# Patient Record
Sex: Female | Born: 1945
Health system: Southern US, Community
[De-identification: ages and names within clinical notes are randomized; demographics above are authoritative.]

## PROBLEM LIST (undated history)

## (undated) DIAGNOSIS — I340 Nonrheumatic mitral (valve) insufficiency: Secondary | ICD-10-CM

## (undated) DIAGNOSIS — C801 Malignant (primary) neoplasm, unspecified: Secondary | ICD-10-CM

## (undated) DIAGNOSIS — R5383 Other fatigue: Secondary | ICD-10-CM

## (undated) DIAGNOSIS — Z0000 Encounter for general adult medical examination without abnormal findings: Secondary | ICD-10-CM

## (undated) DIAGNOSIS — I341 Nonrheumatic mitral (valve) prolapse: Secondary | ICD-10-CM

## (undated) DIAGNOSIS — R519 Headache, unspecified: Secondary | ICD-10-CM

## (undated) DIAGNOSIS — G8929 Other chronic pain: Secondary | ICD-10-CM

## (undated) DIAGNOSIS — Z803 Family history of malignant neoplasm of breast: Secondary | ICD-10-CM

## (undated) DIAGNOSIS — E875 Hyperkalemia: Secondary | ICD-10-CM

## (undated) DIAGNOSIS — Z9181 History of falling: Secondary | ICD-10-CM

## (undated) DIAGNOSIS — R011 Cardiac murmur, unspecified: Secondary | ICD-10-CM

## (undated) DIAGNOSIS — R51 Headache: Secondary | ICD-10-CM

## (undated) DIAGNOSIS — IMO0001 Reserved for inherently not codable concepts without codable children: Secondary | ICD-10-CM

## (undated) DIAGNOSIS — R569 Unspecified convulsions: Secondary | ICD-10-CM

## (undated) DIAGNOSIS — Z9289 Personal history of other medical treatment: Secondary | ICD-10-CM

## (undated) DIAGNOSIS — K5900 Constipation, unspecified: Secondary | ICD-10-CM

## (undated) DIAGNOSIS — Z9889 Other specified postprocedural states: Secondary | ICD-10-CM

## (undated) DIAGNOSIS — R7982 Elevated C-reactive protein (CRP): Secondary | ICD-10-CM

## (undated) HISTORY — DX: Headache: R51

## (undated) HISTORY — DX: Elevated C-reactive protein (CRP): R79.82

## (undated) HISTORY — DX: Malignant (primary) neoplasm, unspecified: C80.1

## (undated) HISTORY — DX: Family history of malignant neoplasm of breast: Z80.3

## (undated) HISTORY — DX: History of falling: Z91.81

## (undated) HISTORY — DX: Hyperkalemia: E87.5

## (undated) HISTORY — DX: Other fatigue: R53.83

## (undated) HISTORY — DX: Encounter for general adult medical examination without abnormal findings: Z00.00

## (undated) HISTORY — DX: Headache, unspecified: R51.9

## (undated) HISTORY — DX: Nonrheumatic mitral (valve) prolapse: I34.1

## (undated) HISTORY — DX: Other chronic pain: G89.29

## (undated) HISTORY — DX: Nonrheumatic mitral (valve) insufficiency: I34.0

## (undated) HISTORY — DX: Cardiac murmur, unspecified: R01.1

---

## 1981-06-18 DIAGNOSIS — C801 Malignant (primary) neoplasm, unspecified: Secondary | ICD-10-CM

## 1981-06-18 HISTORY — PX: TOTAL ABDOMINAL HYSTERECTOMY: SHX209

## 1981-06-18 HISTORY — DX: Malignant (primary) neoplasm, unspecified: C80.1

## 1988-06-18 HISTORY — PX: LAPAROSCOPIC CHOLECYSTECTOMY: SUR755

## 1996-06-18 HISTORY — PX: BREAST BIOPSY: SHX20

## 1999-08-31 ENCOUNTER — Other Ambulatory Visit: Admission: RE | Admit: 1999-08-31 | Discharge: 1999-08-31 | Payer: Self-pay | Admitting: Family Medicine

## 1999-09-21 ENCOUNTER — Encounter: Payer: Self-pay | Admitting: Family Medicine

## 1999-09-21 ENCOUNTER — Encounter: Admission: RE | Admit: 1999-09-21 | Discharge: 1999-09-21 | Payer: Self-pay | Admitting: Family Medicine

## 1999-10-23 ENCOUNTER — Encounter: Admission: RE | Admit: 1999-10-23 | Discharge: 1999-10-23 | Payer: Self-pay | Admitting: Family Medicine

## 1999-10-23 ENCOUNTER — Encounter: Payer: Self-pay | Admitting: Family Medicine

## 2000-09-27 ENCOUNTER — Encounter: Payer: Self-pay | Admitting: Family Medicine

## 2000-09-27 ENCOUNTER — Encounter: Admission: RE | Admit: 2000-09-27 | Discharge: 2000-09-27 | Payer: Self-pay | Admitting: Family Medicine

## 2001-08-16 HISTORY — PX: COLONOSCOPY: SHX174

## 2001-10-16 ENCOUNTER — Encounter: Payer: Self-pay | Admitting: Family Medicine

## 2001-10-16 ENCOUNTER — Encounter: Admission: RE | Admit: 2001-10-16 | Discharge: 2001-10-16 | Payer: Self-pay | Admitting: Family Medicine

## 2001-11-24 ENCOUNTER — Other Ambulatory Visit: Admission: RE | Admit: 2001-11-24 | Discharge: 2001-11-24 | Payer: Self-pay | Admitting: Family Medicine

## 2002-08-29 ENCOUNTER — Encounter: Admission: RE | Admit: 2002-08-29 | Discharge: 2002-08-29 | Payer: Self-pay | Admitting: Family Medicine

## 2002-08-29 ENCOUNTER — Encounter: Payer: Self-pay | Admitting: Family Medicine

## 2002-12-07 ENCOUNTER — Encounter: Admission: RE | Admit: 2002-12-07 | Discharge: 2002-12-07 | Payer: Self-pay | Admitting: Family Medicine

## 2002-12-07 ENCOUNTER — Encounter: Payer: Self-pay | Admitting: Family Medicine

## 2003-12-10 ENCOUNTER — Encounter: Admission: RE | Admit: 2003-12-10 | Discharge: 2003-12-10 | Payer: Self-pay | Admitting: Family Medicine

## 2004-06-01 ENCOUNTER — Other Ambulatory Visit: Admission: RE | Admit: 2004-06-01 | Discharge: 2004-06-01 | Payer: Self-pay | Admitting: Family Medicine

## 2005-03-06 ENCOUNTER — Encounter: Admission: RE | Admit: 2005-03-06 | Discharge: 2005-03-06 | Payer: Self-pay | Admitting: Family Medicine

## 2005-03-19 ENCOUNTER — Encounter: Admission: RE | Admit: 2005-03-19 | Discharge: 2005-03-19 | Payer: Self-pay | Admitting: Family Medicine

## 2005-06-05 ENCOUNTER — Other Ambulatory Visit: Admission: RE | Admit: 2005-06-05 | Discharge: 2005-06-05 | Payer: Self-pay | Admitting: Family Medicine

## 2006-04-15 ENCOUNTER — Encounter: Admission: RE | Admit: 2006-04-15 | Discharge: 2006-04-15 | Payer: Self-pay | Admitting: Family Medicine

## 2006-06-06 ENCOUNTER — Other Ambulatory Visit: Admission: RE | Admit: 2006-06-06 | Discharge: 2006-06-06 | Payer: Self-pay | Admitting: Family Medicine

## 2007-04-23 ENCOUNTER — Encounter: Admission: RE | Admit: 2007-04-23 | Discharge: 2007-04-23 | Payer: Self-pay | Admitting: Family Medicine

## 2007-06-23 ENCOUNTER — Other Ambulatory Visit: Admission: RE | Admit: 2007-06-23 | Discharge: 2007-06-23 | Payer: Self-pay | Admitting: Family Medicine

## 2007-07-01 ENCOUNTER — Encounter: Admission: RE | Admit: 2007-07-01 | Discharge: 2007-07-01 | Payer: Self-pay | Admitting: Family Medicine

## 2008-05-03 ENCOUNTER — Encounter: Admission: RE | Admit: 2008-05-03 | Discharge: 2008-05-03 | Payer: Self-pay | Admitting: Family Medicine

## 2008-07-29 ENCOUNTER — Other Ambulatory Visit: Admission: RE | Admit: 2008-07-29 | Discharge: 2008-07-29 | Payer: Self-pay | Admitting: Family Medicine

## 2009-05-05 ENCOUNTER — Encounter: Admission: RE | Admit: 2009-05-05 | Discharge: 2009-05-05 | Payer: Self-pay | Admitting: Family Medicine

## 2009-08-03 ENCOUNTER — Other Ambulatory Visit: Admission: RE | Admit: 2009-08-03 | Discharge: 2009-08-03 | Payer: Self-pay | Admitting: Family Medicine

## 2009-08-08 ENCOUNTER — Encounter: Admission: RE | Admit: 2009-08-08 | Discharge: 2009-08-08 | Payer: Self-pay | Admitting: Family Medicine

## 2010-05-08 ENCOUNTER — Encounter: Admission: RE | Admit: 2010-05-08 | Discharge: 2010-05-08 | Payer: Self-pay | Admitting: Family Medicine

## 2011-03-26 ENCOUNTER — Other Ambulatory Visit: Payer: Self-pay | Admitting: Family Medicine

## 2011-03-26 DIAGNOSIS — Z1231 Encounter for screening mammogram for malignant neoplasm of breast: Secondary | ICD-10-CM

## 2011-05-14 ENCOUNTER — Ambulatory Visit
Admission: RE | Admit: 2011-05-14 | Discharge: 2011-05-14 | Disposition: A | Discharge status: Home / Self Care | Payer: BC Managed Care – PPO | Source: Ambulatory Visit | Attending: Family Medicine | Admitting: Family Medicine

## 2011-05-14 DIAGNOSIS — Z1231 Encounter for screening mammogram for malignant neoplasm of breast: Secondary | ICD-10-CM

## 2011-09-17 ENCOUNTER — Ambulatory Visit: Payer: Medicare Other | Attending: Family Medicine

## 2011-09-17 DIAGNOSIS — R5381 Other malaise: Secondary | ICD-10-CM | POA: Insufficient documentation

## 2011-09-17 DIAGNOSIS — M545 Low back pain, unspecified: Secondary | ICD-10-CM | POA: Insufficient documentation

## 2011-09-17 DIAGNOSIS — IMO0001 Reserved for inherently not codable concepts without codable children: Secondary | ICD-10-CM | POA: Insufficient documentation

## 2011-09-20 ENCOUNTER — Ambulatory Visit: Payer: Medicare Other

## 2011-10-01 ENCOUNTER — Ambulatory Visit: Payer: Medicare Other

## 2011-10-04 ENCOUNTER — Ambulatory Visit: Payer: Medicare Other | Admitting: Physical Therapy

## 2011-10-08 ENCOUNTER — Ambulatory Visit: Payer: Medicare Other | Admitting: Physical Therapy

## 2011-10-11 ENCOUNTER — Ambulatory Visit: Payer: Medicare Other | Admitting: Physical Therapy

## 2011-10-15 ENCOUNTER — Ambulatory Visit: Payer: Medicare Other | Admitting: Physical Therapy

## 2011-10-18 ENCOUNTER — Ambulatory Visit: Payer: Medicare Other | Attending: Family Medicine | Admitting: Physical Therapy

## 2011-10-18 DIAGNOSIS — M545 Low back pain, unspecified: Secondary | ICD-10-CM | POA: Insufficient documentation

## 2011-10-18 DIAGNOSIS — R5381 Other malaise: Secondary | ICD-10-CM | POA: Insufficient documentation

## 2011-10-18 DIAGNOSIS — IMO0001 Reserved for inherently not codable concepts without codable children: Secondary | ICD-10-CM | POA: Insufficient documentation

## 2011-10-23 ENCOUNTER — Encounter: Payer: BC Managed Care – PPO | Admitting: Physical Therapy

## 2011-10-25 ENCOUNTER — Ambulatory Visit: Payer: Medicare Other | Admitting: Physical Therapy

## 2011-10-31 ENCOUNTER — Ambulatory Visit: Payer: Medicare Other | Admitting: Physical Therapy

## 2011-11-02 ENCOUNTER — Ambulatory Visit: Payer: Medicare Other | Admitting: Physical Therapy

## 2011-11-05 ENCOUNTER — Ambulatory Visit: Payer: Medicare Other | Admitting: Physical Therapy

## 2011-11-08 ENCOUNTER — Ambulatory Visit: Payer: Medicare Other | Admitting: Physical Therapy

## 2012-04-04 ENCOUNTER — Other Ambulatory Visit: Payer: Self-pay | Admitting: Family Medicine

## 2012-04-04 DIAGNOSIS — Z1231 Encounter for screening mammogram for malignant neoplasm of breast: Secondary | ICD-10-CM

## 2012-05-14 ENCOUNTER — Ambulatory Visit
Admission: RE | Admit: 2012-05-14 | Discharge: 2012-05-14 | Disposition: A | Discharge status: Home / Self Care | Payer: Medicare Other | Source: Ambulatory Visit | Attending: Family Medicine | Admitting: Family Medicine

## 2012-05-14 DIAGNOSIS — Z1231 Encounter for screening mammogram for malignant neoplasm of breast: Secondary | ICD-10-CM

## 2013-03-09 ENCOUNTER — Other Ambulatory Visit: Payer: Self-pay

## 2013-03-09 DIAGNOSIS — Z1231 Encounter for screening mammogram for malignant neoplasm of breast: Secondary | ICD-10-CM

## 2013-04-09 ENCOUNTER — Other Ambulatory Visit: Payer: Self-pay | Admitting: *Deleted

## 2013-04-09 ENCOUNTER — Encounter: Payer: Self-pay | Admitting: Cardiovascular Disease

## 2013-04-09 ENCOUNTER — Ambulatory Visit (INDEPENDENT_AMBULATORY_CARE_PROVIDER_SITE_OTHER): Payer: Medicare Other | Admitting: Cardiovascular Disease

## 2013-04-09 VITALS — BP 122/64 | HR 74 | Ht 64.0 in | Wt 130.0 lb

## 2013-04-09 DIAGNOSIS — I341 Nonrheumatic mitral (valve) prolapse: Secondary | ICD-10-CM

## 2013-04-09 DIAGNOSIS — I059 Rheumatic mitral valve disease, unspecified: Secondary | ICD-10-CM

## 2013-04-09 NOTE — Patient Instructions (Signed)
Your physician has requested that you have an echocardiogram in 6 months around 5/15. Echocardiography is a painless test that uses sound waves to create images of your heart. It provides your doctor with information about the size and shape of your heart and how well your heart's chambers and valves are working. This procedure takes approximately one hour. There are no restrictions for this procedure.   Your physician wants you to follow-up in: 6 months// about 1 week after echocardiogram You will receive a reminder letter in the mail two months in advance. If you don't receive a letter, please call our office to schedule the follow-up appointment.  Your physician recommends that you continue on your current medications as directed. Please refer to the Current Medication list given to you today.

## 2013-04-09 NOTE — Progress Notes (Signed)
Destiny Andersen [id number] Date of Birth  1945-08-28       [facility] Office [address]  [address], [address] [place], [place]  [address]   [place], [place]  [address] [phone]     [phone]   Fax  [phone]    Fax [phone]  Problem List: 1. Severe mitral regurgitation 2.   History of Present Illness:  Destiny Andersen is a 67 yo with hx of MVP for years.   She has been very healthy and has never had any cardiac symptoms.   She works out regularly.  She is working with a Psychologist, educational as of this year.    Current Outpatient Prescriptions  Medication Sig Dispense Refill  . aspirin 81 MG chewable tablet Chew 81 mg by mouth daily.      . calcium-vitamin D 250-100 MG-UNIT per tablet Take 1 tablet by mouth 3 (three) times daily.       No current facility-administered medications for this visit.    Allergies  Allergen Reactions  . Penicillins Anaphylaxis  . Codeine     AND CODEINE DERIVATIVES  . Doxycycline Hyclate Nausea And Vomiting  . Ultram [Tramadol] Nausea And Vomiting    Past Medical History  Diagnosis Date  . Risk for falls   . Fatigue   . Chronic headaches     "? Migraines"  . Routine general medical examination at a health care facility   . Medicare annual wellness visit, subsequent   . Family history of breast cancer     Mother  . Elevated C-reactive protein (CRP)   . Cancer 1983    Cervical cancer  . Hyperpotassemia     Migrated  . Heart murmur     ECHO 02/10/13 EF = 60-65%, moderate posterior mitral valve prolapse w/possible flail segment, severe MR, mitral regurgitant jet is anteriorly directed.  . MR (mitral regurgitation)     ECHO 02/10/13 EF = 60-65%, moderate posterior mitral valve prolapse w/possible flail segment, severe MR, mitral regurgitant jet is anteriorly directed.  . Mitral valve anterior leaflet prolapse     ECHO 02/10/13 EF = 60-65%, moderate posterior mitral valve prolapse w/possible flail segment, severe  MR, mitral regurgitant jet is anteriorly directed.    Past Surgical History  Procedure Laterality Date  . Total abdominal hysterectomy  1983    Cervical cancer 1983  . Colonoscopy  08/2001    Colon cancer screening in 01/2012  . Breast biopsy  1998    benign cyst  . Laparoscopic cholecystectomy  1990    History  Smoking status  . Former Smoker -- 25 years  . Types: Cigarettes  . Quit date: 06/18/1985  Smokeless tobacco  . Not on file    History  Alcohol Use  . 1.0 oz/week  . 2 drink(s) per week    Comment: Beer and liquor    Family History  Problem Relation Age of Onset  . Cancer Mother     breast  . Hypertension Mother   . Heart disease Mother   . CAD Mother   . AAA (abdominal aortic aneurysm) Father   . CAD Father   . Hypertension Father   . Heart disease Father   . High Cholesterol Brother   . Cancer Brother     skin  . Hypertension Brother     Reviw of Systems:  Reviewed in the HPI.  All other systems are negative.  Physical Exam: Blood pressure  122/64, pulse 74, height 5\' 4"  (1.626 m), weight 130 lb (58.968 kg). General: Well developed, well nourished, in no acute distress.  Head: Normocephalic, atraumatic, sclera non-icteric, mucus membranes are moist,   Neck: Supple. Carotids are 2 + without bruits. No JVD   Lungs: Clear   Heart:  RR, normal S1, S2, mid systolic click with 2-3/6 systolic.  Not hyperdynamic.  PMI is normal.    Abdomen: Soft, non-tender, non-distended with normal bowel sounds.  Msk:  Strength and tone are normal   Extremities: No clubbing or cyanosis. No edema.  Distal pedal pulses are 2+ and equal    Neuro: CN II - XII intact.  Alert and oriented X 3.   Psych:  Normal   ECG: Oct. 23, 2014:  NSR at 70.  No ST or T wave changes  Assessment / Plan:

## 2013-04-09 NOTE — Assessment & Plan Note (Signed)
Destiny Andersen presents today for further evaluation of mitral valve prolapse. She was originally diagnosed with mitral valve prolapse 20 or 30 years ago. He was not told that she had a heart murmur until recently. She be she had an echocardiogram which revealed mitral valve prolapse involving the posterior leaflet. The report stated that she had severe mitral regurgitation. Has not been able to do the actual echo images yet.  She is completely symptomatically. Her heart rate is normal. She has normal left ventricular systolic function on echo. The PMI is normal.  At this point I think it we can reassess her in 6 months. We'll get an echocardiogram in 6 months. I'll see her in the office approximately one week after that.  She does not need antibiotics prior to dental work - she asked whether or not she needed antibiotics.

## 2013-05-20 ENCOUNTER — Ambulatory Visit
Admission: RE | Admit: 2013-05-20 | Discharge: 2013-05-20 | Disposition: A | Discharge status: Home / Self Care | Payer: Medicare Other | Source: Ambulatory Visit

## 2013-05-20 DIAGNOSIS — Z1231 Encounter for screening mammogram for malignant neoplasm of breast: Secondary | ICD-10-CM

## 2013-10-01 ENCOUNTER — Ambulatory Visit (HOSPITAL_COMMUNITY): Payer: Medicare Other | Attending: Cardiology | Admitting: Radiology

## 2013-10-01 ENCOUNTER — Encounter: Payer: Self-pay | Admitting: Cardiology

## 2013-10-01 DIAGNOSIS — I059 Rheumatic mitral valve disease, unspecified: Secondary | ICD-10-CM | POA: Insufficient documentation

## 2013-10-01 DIAGNOSIS — I341 Nonrheumatic mitral (valve) prolapse: Secondary | ICD-10-CM

## 2013-10-01 DIAGNOSIS — R011 Cardiac murmur, unspecified: Secondary | ICD-10-CM | POA: Insufficient documentation

## 2013-10-01 NOTE — Progress Notes (Signed)
Echocardiogram performed. If patient needs to be called before appointment regarding echo please call her cell [phone]

## 2013-10-08 ENCOUNTER — Ambulatory Visit (INDEPENDENT_AMBULATORY_CARE_PROVIDER_SITE_OTHER): Payer: Medicare Other | Admitting: Cardiovascular Disease

## 2013-10-08 ENCOUNTER — Encounter: Payer: Self-pay | Admitting: Cardiovascular Disease

## 2013-10-08 VITALS — BP 120/72 | HR 75 | Ht 64.0 in | Wt 128.4 lb

## 2013-10-08 DIAGNOSIS — I341 Nonrheumatic mitral (valve) prolapse: Secondary | ICD-10-CM

## 2013-10-08 DIAGNOSIS — I059 Rheumatic mitral valve disease, unspecified: Secondary | ICD-10-CM

## 2013-10-08 NOTE — Assessment & Plan Note (Signed)
Destiny Andersen  has mitral valve prolapse with severe mitral regurgitation. Despite this, she is completely asymptomatic. She walks every day. She works out with her trainer once a week. [place] and not having problems. She regularly goes  down to [facility].  At this point I do not think that she needs to have surgery. We'll continue to follow her closely. I'll see her again in 6 months for followup visit.

## 2013-10-08 NOTE — Patient Instructions (Signed)
Your physician recommends that you continue on your current medications as directed. Please refer to the Current Medication list given to you today.  Your physician wants you to follow-up in: 6 months with Dr. [name].  You will receive a reminder letter in the mail two months in advance. If you don't receive a letter, please call our office to schedule the follow-up appointment.  

## 2013-10-08 NOTE — Progress Notes (Signed)
Glanda Spanbauer ZOXWRUE Date of Birth  1946-02-03       [facility] [address] [place], [place], [place]  [address]   [place], [place]  [address] [phone]     [phone]   Fax  [phone]    Fax [phone]  Problem List: 1. Severe mitral regurgitation 2.   History of Present Illness:  Destiny Andersen is a 68 yo with hx of MVP for years.   She has been very healthy and has never had any cardiac symptoms.   She works out regularly.  She is working with a Clinical research associate as of this year.    October 08, 2013:  [name] is doing well.  She had an echo recently that revealed severe MR with prolapse of the posterior leaflet.  She has slight dyspnea walking up a hill on her usual route but otherwise, she does not have any problems.  Works out with a Clinical research associate once a week.     Current Outpatient Prescriptions  Medication Sig Dispense Refill  . aspirin 81 MG chewable tablet Chew 81 mg by mouth daily.      . calcium-vitamin D 250-100 MG-UNIT per tablet Take 1 tablet by mouth 3 (three) times daily.       No current facility-administered medications for this visit.    Allergies  Allergen Reactions  . Penicillins Anaphylaxis  . Codeine     AND CODEINE DERIVATIVES  . Doxycycline Hyclate Nausea And Vomiting  . Ultram [Tramadol] Nausea And Vomiting    Past Medical History  Diagnosis Date  . Risk for falls   . Fatigue   . Chronic headaches     "? Migraines"  . Routine general medical examination at a health care facility   . Medicare annual wellness visit, subsequent   . Family history of breast cancer     Mother  . Elevated C-reactive protein (CRP)   . Cancer 1983    Cervical cancer  . Hyperpotassemia     Migrated  . Heart murmur     ECHO 02/10/13 EF = 60-65%, moderate posterior mitral valve prolapse w/possible flail segment, severe MR, mitral regurgitant jet is anteriorly directed.  . MR (mitral regurgitation)     ECHO 02/10/13 EF  = 60-65%, moderate posterior mitral valve prolapse w/possible flail segment, severe MR, mitral regurgitant jet is anteriorly directed.  . Mitral valve anterior leaflet prolapse     ECHO 02/10/13 EF = 60-65%, moderate posterior mitral valve prolapse w/possible flail segment, severe MR, mitral regurgitant jet is anteriorly directed.    Past Surgical History  Procedure Laterality Date  . Total abdominal hysterectomy  1983    Cervical cancer 1983  . Colonoscopy  08/2001    Colon cancer screening in 01/2012  . Breast biopsy  1998    benign cyst  . Laparoscopic cholecystectomy  1990    History  Smoking status  . Former Smoker -- 25 years  . Types: Cigarettes  . Quit date: 06/18/1985  Smokeless tobacco  . Not on file    History  Alcohol Use  . 1.0 oz/week  . 2 drink(s) per week    Comment: Beer and liquor    Family History  Problem Relation Age of Onset  . Cancer Mother     breast  . Hypertension Mother   . Heart disease Mother   . CAD Mother   . AAA (abdominal aortic aneurysm) Father   .  CAD Father   . Hypertension Father   . Heart disease Father   . High Cholesterol Brother   . Cancer Brother     skin  . Hypertension Brother     Reviw of Systems:  Reviewed in the HPI.  All other systems are negative.  Physical Exam: There were no vitals taken for this visit. General: Well developed, well nourished, in no acute distress.  Head: Normocephalic, atraumatic, sclera non-icteric, mucus membranes are moist,   Neck: Supple. Carotids are 2 + without bruits. No JVD   Lungs: Clear   Heart:  RR, normal S1, S2, mid systolic click with 5-8/5 systolic.  Not hyperdynamic.  PMI is normal.    Abdomen: Soft, non-tender, non-distended with normal bowel sounds.  Msk:  Strength and tone are normal   Extremities: No clubbing or cyanosis. No edema.  Distal pedal pulses are 2+ and equal    Neuro: CN II - XII intact.  Alert and oriented X 3.   Psych:  Normal   ECG: Oct.  23, 2014:  NSR at 40.  No ST or T wave changes  Assessment / Plan:

## 2014-04-06 ENCOUNTER — Ambulatory Visit (INDEPENDENT_AMBULATORY_CARE_PROVIDER_SITE_OTHER): Payer: Medicare Other | Admitting: Cardiovascular Disease

## 2014-04-06 ENCOUNTER — Encounter: Payer: Self-pay | Admitting: Cardiovascular Disease

## 2014-04-06 VITALS — BP 118/68 | HR 67 | Ht 64.0 in | Wt 130.2 lb

## 2014-04-06 DIAGNOSIS — I341 Nonrheumatic mitral (valve) prolapse: Secondary | ICD-10-CM

## 2014-04-06 NOTE — Progress Notes (Signed)
Destiny Andersen [id number] Date of Birth  [birth year]       [facility] [address], [address] [place], [place], [place]  [address]   [place], [place]  [address] [phone]     [phone]   Fax  [phone]    Fax [phone]  Problem List: 1. Severe mitral regurgitation 2.   History of Present Illness:  Destiny Andersen is a 68 yo with hx of MVP for years.   She has been very healthy and has never had any cardiac symptoms.   She works out regularly.  She is working with a Clinical research associate as of this year.    October 08, 2013:  Destiny Andersen is doing well.  She had an echo recently that revealed severe MR with prolapse of the posterior leaflet.  She has slight dyspnea walking up a hill on her usual route but otherwise, she does not have any problems.  Works out with a Clinical research associate once a week.    Oct. 20, 2015:  Destiny Andersen is doing well.   She is walking regularly - able to walk her usual route in the mornings - 2 - 3 miles a day.  She does not have any episodes of chest pain or shortness of breath. She thinks she is able to walk better now than she was at the first of this year     Current Outpatient Prescriptions  Medication Sig Dispense Refill  . aspirin 81 MG chewable tablet Chew 81 mg by mouth daily.      . calcium-vitamin D 250-100 MG-UNIT per tablet Take 1 tablet by mouth 3 (three) times daily.       No current facility-administered medications for this visit.    Allergies  Allergen Reactions  . Penicillins Anaphylaxis  . Codeine     AND CODEINE DERIVATIVES  . Doxycycline Hyclate Nausea And Vomiting  . Ultram [Tramadol] Nausea And Vomiting    Past Medical History  Diagnosis Date  . Risk for falls   . Fatigue   . Chronic headaches     "? Migraines"  . Routine general medical examination at a health care facility   . Medicare annual wellness visit, subsequent   . Family history of breast cancer     Mother  . Elevated C-reactive protein (CRP)   .  Cancer 1983    Cervical cancer  . Hyperpotassemia     Migrated  . Heart murmur     ECHO 02/10/13 EF = 60-65%, moderate posterior mitral valve prolapse w/possible flail segment, severe MR, mitral regurgitant jet is anteriorly directed.  . MR (mitral regurgitation)     ECHO 02/10/13 EF = 60-65%, moderate posterior mitral valve prolapse w/possible flail segment, severe MR, mitral regurgitant jet is anteriorly directed.  . Mitral valve anterior leaflet prolapse     ECHO 02/10/13 EF = 60-65%, moderate posterior mitral valve prolapse w/possible flail segment, severe MR, mitral regurgitant jet is anteriorly directed.    Past Surgical History  Procedure Laterality Date  . Total abdominal hysterectomy  1983    Cervical cancer 1983  . Colonoscopy  08/2001    Colon cancer screening in 01/2012  . Breast biopsy  1998    benign cyst  . Laparoscopic cholecystectomy  1990    History  Smoking status  . Former Smoker -- 25 years  . Types: Cigarettes  . Quit date: 06/18/1985  Smokeless tobacco  . Not on file  History  Alcohol Use  . 1.0 oz/week  . 2 drink(s) per week    Comment: Beer and liquor    Family History  Problem Relation Age of Onset  . Cancer Mother     breast  . Hypertension Mother   . Heart disease Mother   . CAD Mother   . AAA (abdominal aortic aneurysm) Father   . CAD Father   . Hypertension Father   . Heart disease Father   . High Cholesterol Brother   . Cancer Brother     skin  . Hypertension Brother     Reviw of Systems:  Reviewed in the HPI.  All other systems are negative.  Physical Exam: Blood pressure 118/68, pulse 67, height 5\' 4"  (1.626 m), weight 130 lb 3.2 oz (59.058 kg). General: Well developed, well nourished, in no acute distress.  Head: Normocephalic, atraumatic, sclera non-icteric, mucus membranes are moist,   Neck: Supple. Carotids are 2 + without bruits. No JVD   Lungs: Clear   Heart:  RR, normal S1, S2, mid systolic click with 5-9/1  systolic.  Not hyperdynamic.  PMI is normal.    Abdomen: Soft, non-tender, non-distended with normal bowel sounds.  Msk:  Strength and tone are normal   Extremities: No clubbing or cyanosis. No edema.  Distal pedal pulses are 2+ and equal    Neuro: CN II - XII intact.  Alert and oriented X 3.   Psych:  Normal   ECG: 04/06/2014: Normal sinus rhythm at 67. EKG is otherwise normal.  Assessment / Plan:

## 2014-04-06 NOTE — Assessment & Plan Note (Signed)
Destiny Andersen is doing well.  No symptoms. Her echocardiogram shows fairly significant mitral regurgitation but she is completely asymptomatic. In addition, her heart is not  Hyperdynamic. Contingency are a regular basis. I'll see her in 6 months. Will anticipate getting another echocardiogram in another year or superior

## 2014-04-06 NOTE — Patient Instructions (Signed)
Your physician recommends that you continue on your current medications as directed. Please refer to the Current Medication list given to you today.  Your physician wants you to follow-up in: 6 months with Dr. [name].  You will receive a reminder letter in the mail two months in advance. If you don't receive a letter, please call our office to schedule the follow-up appointment.  

## 2014-04-12 ENCOUNTER — Other Ambulatory Visit: Payer: Self-pay

## 2014-04-12 DIAGNOSIS — Z1231 Encounter for screening mammogram for malignant neoplasm of breast: Secondary | ICD-10-CM

## 2014-04-19 ENCOUNTER — Encounter: Payer: Self-pay | Admitting: Cardiovascular Disease

## 2014-05-21 ENCOUNTER — Ambulatory Visit
Admission: RE | Admit: 2014-05-21 | Discharge: 2014-05-21 | Disposition: A | Discharge status: Home / Self Care | Payer: Medicare Other | Source: Ambulatory Visit

## 2014-05-21 DIAGNOSIS — Z1231 Encounter for screening mammogram for malignant neoplasm of breast: Secondary | ICD-10-CM

## 2014-06-23 ENCOUNTER — Other Ambulatory Visit: Payer: Self-pay | Admitting: Family Medicine

## 2014-06-23 ENCOUNTER — Ambulatory Visit
Admission: RE | Admit: 2014-06-23 | Discharge: 2014-06-23 | Disposition: A | Discharge status: Home / Self Care | Payer: PRIVATE HEALTH INSURANCE | Source: Ambulatory Visit | Attending: Family Medicine | Admitting: Family Medicine

## 2014-06-23 DIAGNOSIS — S8991XA Unspecified injury of right lower leg, initial encounter: Secondary | ICD-10-CM

## 2014-10-05 ENCOUNTER — Ambulatory Visit (INDEPENDENT_AMBULATORY_CARE_PROVIDER_SITE_OTHER): Payer: Medicare Other | Admitting: Cardiovascular Disease

## 2014-10-05 ENCOUNTER — Encounter: Payer: Self-pay | Admitting: Cardiovascular Disease

## 2014-10-05 VITALS — BP 110/70 | HR 73 | Ht 64.0 in | Wt 129.0 lb

## 2014-10-05 DIAGNOSIS — I341 Nonrheumatic mitral (valve) prolapse: Secondary | ICD-10-CM

## 2014-10-05 DIAGNOSIS — I340 Nonrheumatic mitral (valve) insufficiency: Secondary | ICD-10-CM

## 2014-10-05 NOTE — Patient Instructions (Addendum)
Medication Instructions:  Your physician recommends that you continue on your current medications as directed. Please refer to the Current Medication list given to you today.  Labwork: None  Testing/Procedures: Your physician has requested that you have an echocardiogram. Echocardiography is a painless test that uses sound waves to create images of your heart. It provides your doctor with information about the size and shape of your heart and how well your heart's chambers and valves are working. This procedure takes approximately one hour. There are no restrictions for this procedure.   Follow-Up: Your physician wants you to follow-up in: 6 months with Dr. Acie Fredrickson.  You will receive a reminder letter in the mail two months in advance. If you don't receive a letter, please call our office to schedule the follow-up appointment.

## 2014-10-05 NOTE — Progress Notes (Signed)
Cardiology Office Note   Date:  10/05/2014   ID:  Destiny Andersen, DOB [birth year], MRN [id number]  PCP:  Marjorie Smolder, MD  Cardiologist:   Thayer Headings, MD   Chief Complaint  Patient presents with  . Follow-up    mitral valve prolapse.   1. Severe mitral regurgitation 2.   History of Present Illness:  Destiny Andersen is a 69 yo with hx of MVP for years. She has been very healthy and has never had any cardiac symptoms. She works out regularly. She is working with a Clinical research associate as of this year.   October 08, 2013:  Destiny Andersen is doing well. She had an echo recently that revealed severe MR with prolapse of the posterior leaflet. She has slight dyspnea walking up a hill on her usual route but otherwise, she does not have any problems. Works out with a Clinical research associate once a week.   Oct. 20, 2015:  Destiny Andersen is doing well.  She is walking regularly - able to walk her usual route in the mornings - 2 - 3 miles a day. She does not have any episodes of chest pain or shortness of breath. She thinks she is able to walk better now than she was at the first of this year   October 05, 2014:  Destiny Andersen is a 69 y.o. female who presents for follow up of her mitral valve prolapse.     Past Medical History  Diagnosis Date  . Risk for falls   . Fatigue   . Chronic headaches     "? Migraines"  . Routine general medical examination at a health care facility   . Medicare annual wellness visit, subsequent   . Family history of breast cancer     Mother  . Elevated C-reactive protein (CRP)   . Cancer 1983    Cervical cancer  . Hyperpotassemia     Migrated  . Heart murmur     ECHO 02/10/13 EF = 60-65%, moderate posterior mitral valve prolapse w/possible flail segment, severe MR, mitral regurgitant jet is anteriorly directed.  . MR (mitral regurgitation)     ECHO 02/10/13 EF = 60-65%, moderate posterior mitral valve prolapse w/possible flail segment, severe MR, mitral regurgitant jet is anteriorly  directed.  . Mitral valve anterior leaflet prolapse     ECHO 02/10/13 EF = 60-65%, moderate posterior mitral valve prolapse w/possible flail segment, severe MR, mitral regurgitant jet is anteriorly directed.    Past Surgical History  Procedure Laterality Date  . Total abdominal hysterectomy  1983    Cervical cancer 1983  . Colonoscopy  08/2001    Colon cancer screening in 01/2012  . Breast biopsy  1998    benign cyst  . Laparoscopic cholecystectomy  1990     Current Outpatient Prescriptions  Medication Sig Dispense Refill  . aspirin 81 MG tablet Take 81 mg by mouth daily.    . calcium-vitamin D 250-100 MG-UNIT per tablet Take 1 tablet by mouth 3 (three) times daily.     No current facility-administered medications for this visit.    Allergies:   Penicillins; Codeine; Doxycycline hyclate; and Ultram    Social History:  The patient  reports that she quit smoking about 29 years ago. Her smoking use included Cigarettes. She quit after 25 years of use. She does not have any smokeless tobacco history on file. She reports that she drinks about 1.0 oz of alcohol per week. She reports that she does not use illicit drugs.  Family History:  The patient's family history includes AAA (abdominal aortic aneurysm) in her father; CAD in her father and mother; Cancer in her brother and mother; Heart disease in her father and mother; High Cholesterol in her brother; Hypertension in her brother, father, and mother.    ROS:  Please see the history of present illness.    Review of Systems: Constitutional:  denies fever, chills, diaphoresis, appetite change and fatigue.  HEENT: denies photophobia, eye pain, redness, hearing loss, ear pain, congestion, sore throat, rhinorrhea, sneezing, neck pain, neck stiffness and tinnitus.  Respiratory: denies SOB, DOE, cough, chest tightness, and wheezing.  Cardiovascular: denies chest pain, palpitations and leg swelling.  Gastrointestinal: denies nausea,  vomiting, abdominal pain, diarrhea, constipation, blood in stool.  Genitourinary: denies dysuria, urgency, frequency, hematuria, flank pain and difficulty urinating.  Musculoskeletal: denies  myalgias, back pain, joint swelling, arthralgias and gait problem.   Skin: denies pallor, rash and wound.  Neurological: denies dizziness, seizures, syncope, weakness, light-headedness, numbness and headaches.   Hematological: denies adenopathy, easy bruising, personal or family bleeding history.  Psychiatric/ Behavioral: denies suicidal ideation, mood changes, confusion, nervousness, sleep disturbance and agitation.       All other systems are reviewed and negative.    PHYSICAL EXAM: VS:  BP 110/70 mmHg  Pulse 73  Ht 5\' 4"  (1.626 m)  Wt 129 lb (58.514 kg)  BMI 22.13 kg/m2 , BMI Body mass index is 22.13 kg/(m^2). GEN: Well nourished, well developed, in no acute distress HEENT: normal Neck: no JVD, carotid bruits, or masses Cardiac: RRR; she has a late systolic murmur. No  rubs, or gallops,no edema , the heart is not hyperdynamic.  Respiratory:  clear to auscultation bilaterally, normal work of breathing GI: soft, nontender, nondistended, + BS MS: no deformity or atrophy Skin: warm and dry, no rash Neuro:  Strength and sensation are intact Psych: normal   EKG:  EKG is not ordered today.    Recent Labs: No results found for requested labs within last 365 days.    Lipid Panel No results found for: CHOL, TRIG, HDL, CHOLHDL, VLDL, LDLCALC, LDLDIRECT    Wt Readings from Last 3 Encounters:  10/05/14 129 lb (58.514 kg)  04/06/14 130 lb 3.2 oz (59.058 kg)  10/08/13 128 lb 6.4 oz (58.242 kg)      Other studies Reviewed: Additional studies/ records that were reviewed today include: . Review of the above records demonstrates:    ASSESSMENT AND PLAN:  1.  Mitral regurgitation: The patient presents for follow-up of her mitral regurgitation. She had an echo card gram performed last  year which showed severe mitral valve prolapse with severe mitral regurgitation per she is completely asymptomatic. For her left ventricle systolic function is well-preserved.  We'll get a repeat echo card gram to compare to last years. If she has any signs of LV failure then we will refer her to [facility].  i will see her in 6 months.     Current medicines are reviewed at length with the patient today.  The patient does not have concerns regarding medicines.  The following changes have been made:  no change  Labs/ tests ordered today include:  No orders of the defined types were placed in this encounter.     Disposition:   FU with me in 6 months     Signed, [name], Wonda Cheng, MD  10/05/2014 11:25 AM    [place], [place], [place]  [address] Phone: [phone]; Fax: [phone]

## 2014-10-11 ENCOUNTER — Ambulatory Visit (HOSPITAL_COMMUNITY): Payer: Medicare Other | Attending: Cardiology | Admitting: Radiology

## 2014-10-11 DIAGNOSIS — I340 Nonrheumatic mitral (valve) insufficiency: Secondary | ICD-10-CM | POA: Diagnosis not present

## 2014-10-11 NOTE — Progress Notes (Signed)
Echocardiogram performed.  

## 2015-02-15 ENCOUNTER — Other Ambulatory Visit: Payer: Self-pay | Admitting: Family Medicine

## 2015-02-15 DIAGNOSIS — Z1231 Encounter for screening mammogram for malignant neoplasm of breast: Secondary | ICD-10-CM

## 2015-02-15 DIAGNOSIS — M8580 Other specified disorders of bone density and structure, unspecified site: Secondary | ICD-10-CM

## 2015-04-13 ENCOUNTER — Encounter: Payer: Self-pay | Admitting: Cardiovascular Disease

## 2015-04-13 ENCOUNTER — Ambulatory Visit (INDEPENDENT_AMBULATORY_CARE_PROVIDER_SITE_OTHER): Payer: Medicare Other | Admitting: Cardiovascular Disease

## 2015-04-13 VITALS — BP 100/66 | HR 67 | Ht 64.0 in | Wt 124.8 lb

## 2015-04-13 DIAGNOSIS — I341 Nonrheumatic mitral (valve) prolapse: Secondary | ICD-10-CM

## 2015-04-13 DIAGNOSIS — I059 Rheumatic mitral valve disease, unspecified: Secondary | ICD-10-CM

## 2015-04-13 DIAGNOSIS — R0989 Other specified symptoms and signs involving the circulatory and respiratory systems: Secondary | ICD-10-CM | POA: Diagnosis not present

## 2015-04-13 DIAGNOSIS — I340 Nonrheumatic mitral (valve) insufficiency: Secondary | ICD-10-CM | POA: Diagnosis not present

## 2015-04-13 NOTE — Progress Notes (Signed)
Cardiology Office Note   Date:  04/13/2015   ID:  Destiny Andersen, DOB 1946/04/11, MRN [id number]  PCP:  Marjorie Smolder, MD  Cardiologist:   Thayer Headings, MD   Chief Complaint  Patient presents with  . Follow-up    mitral regurgitation   1. Severe mitral regurgitation 2.   History of Present Illness:  Destiny Andersen is a 69 yo with hx of MVP for years. She has been very healthy and has never had any cardiac symptoms. She works out regularly. She is working with a Clinical research associate as of this year.   October 08, 2013:  Destiny Andersen is doing well. She had an echo recently that revealed severe MR with prolapse of the posterior leaflet. She has slight dyspnea walking up a hill on her usual route but otherwise, she does not have any problems. Works out with a Clinical research associate once a week.   Oct. 20, 2015:  Destiny Andersen is doing well.  She is walking regularly - able to walk her usual route in the mornings - 2 - 3 miles a day. She does not have any episodes of chest pain or shortness of breath. She thinks she is able to walk better now than she was at the first of this year   October 05, 2014:  Destiny Andersen is a 69 y.o. female who presents for follow up of her mitral valve prolapse.   Oct. 26, 2016:  Destiny Andersen is doing well. Still exercising .   Had an episode of abdominal pain / chest pressure in May .  Has felt well since that time Walks 2-3 miles a day without DOE,  No CP    Past Medical History  Diagnosis Date  . Risk for falls   . Fatigue   . Chronic headaches     "? Migraines"  . Routine general medical examination at a health care facility   . Medicare annual wellness visit, subsequent   . Family history of breast cancer     Mother  . Elevated C-reactive protein (CRP)   . Cancer [facility]) 1983    Cervical cancer  . Hyperpotassemia     Migrated  . Heart murmur     ECHO 02/10/13 EF = 60-65%, moderate posterior mitral valve prolapse w/possible flail segment, severe MR, mitral regurgitant jet  is anteriorly directed.  . MR (mitral regurgitation)     ECHO 02/10/13 EF = 60-65%, moderate posterior mitral valve prolapse w/possible flail segment, severe MR, mitral regurgitant jet is anteriorly directed.  . Mitral valve anterior leaflet prolapse     ECHO 02/10/13 EF = 60-65%, moderate posterior mitral valve prolapse w/possible flail segment, severe MR, mitral regurgitant jet is anteriorly directed.    Past Surgical History  Procedure Laterality Date  . Total abdominal hysterectomy  1983    Cervical cancer 1983  . Colonoscopy  08/2001    Colon cancer screening in 01/2012  . Breast biopsy  1998    benign cyst  . Laparoscopic cholecystectomy  1990     Current Outpatient Prescriptions  Medication Sig Dispense Refill  . aspirin 81 MG tablet Take 81 mg by mouth daily.    . calcium citrate-vitamin D (CITRACAL+D) 315-200 MG-UNIT tablet Take 1 tablet by mouth 3 (three) times daily. VITAMIN D DOSAGE IS 250MG  INSTEAD OF 200     No current facility-administered medications for this visit.    Allergies:   Penicillins; Codeine; Doxycycline hyclate; and Ultram    Social History:  The patient  reports that she quit smoking about 29 years ago. Her smoking use included Cigarettes. She quit after 25 years of use. She does not have any smokeless tobacco history on file. She reports that she drinks about 1.0 oz of alcohol per week. She reports that she does not use illicit drugs.   Family History:  The patient's family history includes AAA (abdominal aortic aneurysm) in her father; CAD in her father and mother; Cancer in her brother and mother; Heart disease in her father and mother; High Cholesterol in her brother; Hypertension in her brother, father, and mother.    ROS:  Please see the history of present illness.    Review of Systems: Constitutional:  denies fever, chills, diaphoresis, appetite change and fatigue.  HEENT: denies photophobia, eye pain, redness, hearing loss, ear pain,  congestion, sore throat, rhinorrhea, sneezing, neck pain, neck stiffness and tinnitus.  Respiratory: denies SOB, DOE, cough, chest tightness, and wheezing.  Cardiovascular: denies chest pain, palpitations and leg swelling.  Gastrointestinal: denies nausea, vomiting, abdominal pain, diarrhea, constipation, blood in stool.  Genitourinary: denies dysuria, urgency, frequency, hematuria, flank pain and difficulty urinating.  Musculoskeletal: denies  myalgias, back pain, joint swelling, arthralgias and gait problem.   Skin: denies pallor, rash and wound.  Neurological: denies dizziness, seizures, syncope, weakness, light-headedness, numbness and headaches.   Hematological: denies adenopathy, easy bruising, personal or family bleeding history.  Psychiatric/ Behavioral: denies suicidal ideation, mood changes, confusion, nervousness, sleep disturbance and agitation.       All other systems are reviewed and negative.    PHYSICAL EXAM: VS:  BP 100/66 mmHg  Pulse 67  Ht 5\' 4"  (1.626 m)  Wt 124 lb 12.8 oz (56.609 kg)  BMI 21.41 kg/m2 , BMI Body mass index is 21.41 kg/(m^2). GEN: Well nourished, well developed, in no acute distress HEENT: normal Neck: no JVD, carotid bruits, or masses Cardiac: RRR; she has a late systolic 6-1/9  murmur. No  rubs, or gallops,no edema , the heart is not hyperdynamic.  Respiratory:  clear to auscultation bilaterally, normal work of breathing GI: soft, nontender, nondistended, + BS,  + pulsitile abdominal mass,  MS: no deformity or atrophy Skin: warm and dry, no rash Neuro:  Strength and sensation are intact Psych: normal   EKG:  EKG is ordered today. NSR at 67.  Normal ECG     Recent Labs: No results found for requested labs within last 365 days.    Lipid Panel No results found for: CHOL, TRIG, HDL, CHOLHDL, VLDL, LDLCALC, LDLDIRECT    Wt Readings from Last 3 Encounters:  04/13/15 124 lb 12.8 oz (56.609 kg)  10/05/14 129 lb (58.514 kg)  04/06/14  130 lb 3.2 oz (59.058 kg)      Other studies Reviewed: Additional studies/ records that were reviewed today include: . Review of the above records demonstrates:    ASSESSMENT AND PLAN:  1.  Mitral regurgitation: The patient presents for follow-up of her mitral regurgitation. She had an echo card gram performed last year which showed severe mitral valve prolapse with severe mitral regurgitation per she is completely asymptomatic. Her left ventricle systolic function is well-preserved.  Will see her in 6 months with an echo several days prior to her OV  3. pulsitile abdominal aorta Has a hx of MVP.  Will get an abdominal duplex to R/o AAA  ( father died of AAA)   i will see her in 13 months.     Current medicines are reviewed at length with  the patient today.  The patient does not have concerns regarding medicines.  The following changes have been made:  no change  Labs/ tests ordered today include:  No orders of the defined types were placed in this encounter.     Disposition:   FU with me in 6 months     Signed, [name], Wonda Cheng, MD  04/13/2015 11:45 AM    [facility] [place], [place], [place]  [address] Phone: [phone]; Fax: [phone]

## 2015-04-13 NOTE — Patient Instructions (Addendum)
Medication Instructions:  Your physician recommends that you continue on your current medications as directed. Please refer to the Current Medication list given to you today.   Labwork: None Ordered   Testing/Procedures: Your physician has requested that you have an abdominal aorta duplex. During this test, an ultrasound is used to evaluate the aorta. Allow 30 minutes for this exam. Do not eat after midnight the day before and avoid carbonated beverages  Your physician has requested that you have an echocardiogram in 6 months before your visit with Dr. Acie Fredrickson. Echocardiography is a painless test that uses sound waves to create images of your heart. It provides your doctor with information about the size and shape of your heart and how well your heart's chambers and valves are working. This procedure takes approximately one hour. There are no restrictions for this procedure.    Follow-Up: Your physician wants you to follow-up in: 6 months (after your echo) with Dr. Acie Fredrickson.  You will receive a reminder letter in the mail two months in advance. If you don't receive a letter, please call our office to schedule the follow-up appointment.   If you need a refill on your cardiac medications before your next appointment, please call your pharmacy.

## 2015-04-14 ENCOUNTER — Other Ambulatory Visit: Payer: Self-pay | Admitting: Cardiovascular Disease

## 2015-04-14 DIAGNOSIS — IMO0001 Reserved for inherently not codable concepts without codable children: Secondary | ICD-10-CM

## 2015-04-14 DIAGNOSIS — Z139 Encounter for screening, unspecified: Secondary | ICD-10-CM

## 2015-04-14 DIAGNOSIS — R0989 Other specified symptoms and signs involving the circulatory and respiratory systems: Secondary | ICD-10-CM

## 2015-04-15 ENCOUNTER — Ambulatory Visit (HOSPITAL_COMMUNITY)
Admission: RE | Admit: 2015-04-15 | Discharge: 2015-04-15 | Disposition: A | Discharge status: Home / Self Care | Payer: Medicare Other | Source: Ambulatory Visit | Attending: Cardiovascular Disease | Admitting: Cardiovascular Disease

## 2015-04-15 DIAGNOSIS — Z8249 Family history of ischemic heart disease and other diseases of the circulatory system: Secondary | ICD-10-CM | POA: Insufficient documentation

## 2015-04-15 DIAGNOSIS — IMO0001 Reserved for inherently not codable concepts without codable children: Secondary | ICD-10-CM

## 2015-04-15 DIAGNOSIS — Z139 Encounter for screening, unspecified: Secondary | ICD-10-CM

## 2015-04-15 DIAGNOSIS — R0989 Other specified symptoms and signs involving the circulatory and respiratory systems: Secondary | ICD-10-CM | POA: Insufficient documentation

## 2015-04-22 ENCOUNTER — Inpatient Hospital Stay (HOSPITAL_COMMUNITY): Admission: RE | Admit: 2015-04-22 | Payer: Medicare Other | Source: Ambulatory Visit

## 2015-05-23 ENCOUNTER — Ambulatory Visit
Admission: RE | Admit: 2015-05-23 | Discharge: 2015-05-23 | Disposition: A | Discharge status: Home / Self Care | Payer: Medicare Other | Source: Ambulatory Visit | Attending: Family Medicine | Admitting: Family Medicine

## 2015-05-23 DIAGNOSIS — M8580 Other specified disorders of bone density and structure, unspecified site: Secondary | ICD-10-CM

## 2015-05-23 DIAGNOSIS — Z1231 Encounter for screening mammogram for malignant neoplasm of breast: Secondary | ICD-10-CM

## 2015-10-10 ENCOUNTER — Ambulatory Visit (HOSPITAL_COMMUNITY): Payer: Medicare Other | Attending: Cardiovascular Disease

## 2015-10-10 ENCOUNTER — Ambulatory Visit (INDEPENDENT_AMBULATORY_CARE_PROVIDER_SITE_OTHER): Payer: Medicare Other | Admitting: Cardiovascular Disease

## 2015-10-10 ENCOUNTER — Encounter: Payer: Self-pay | Admitting: Cardiovascular Disease

## 2015-10-10 ENCOUNTER — Other Ambulatory Visit: Payer: Self-pay

## 2015-10-10 VITALS — BP 110/78 | HR 60 | Ht 64.0 in | Wt 125.1 lb

## 2015-10-10 DIAGNOSIS — I341 Nonrheumatic mitral (valve) prolapse: Secondary | ICD-10-CM | POA: Insufficient documentation

## 2015-10-10 DIAGNOSIS — Z87891 Personal history of nicotine dependence: Secondary | ICD-10-CM | POA: Insufficient documentation

## 2015-10-10 DIAGNOSIS — I059 Rheumatic mitral valve disease, unspecified: Secondary | ICD-10-CM | POA: Diagnosis present

## 2015-10-10 DIAGNOSIS — I517 Cardiomegaly: Secondary | ICD-10-CM | POA: Insufficient documentation

## 2015-10-10 DIAGNOSIS — I340 Nonrheumatic mitral (valve) insufficiency: Secondary | ICD-10-CM

## 2015-10-10 NOTE — Patient Instructions (Signed)
Medication Instructions:  Your physician recommends that you continue on your current medications as directed. Please refer to the Current Medication list given to you today.   Labwork: None Ordered   Testing/Procedures: None Ordered   Follow-Up: Your physician wants you to follow-up in: 6 months with Dr. [name].  You will receive a reminder letter in the mail two months in advance. If you don't receive a letter, please call our office to schedule the follow-up appointment.   If you need a refill on your cardiac medications before your next appointment, please call your pharmacy.   Thank you for choosing [facility]! Amanda Steuart, RN [phone]    

## 2015-10-10 NOTE — Progress Notes (Addendum)
Cardiology Office Note   Date:  10/10/2015   ID:  Destiny Andersen, DOB 27-May-1946, MRN [id number]  PCP:  Marjorie Smolder, MD  Cardiologist:   Thayer Headings, MD   Chief Complaint  Patient presents with  . Follow-up    mitral valve surgery    1. Severe mitral regurgitation 2.   History of Present Illness:  Destiny Andersen is a 70 yo with hx of MVP for years. She has been very healthy and has never had any cardiac symptoms. She works out regularly. She is working with a Clinical research associate as of this year.   October 08, 2013:  Destiny Andersen is doing well. She had an echo recently that revealed severe MR with prolapse of the posterior leaflet. She has slight dyspnea walking up a hill on her usual route but otherwise, she does not have any problems. Works out with a Clinical research associate once a week.   Oct. 20, 2015:  Destiny Andersen is doing well.  She is walking regularly - able to walk her usual route in the mornings - 2 - 3 miles a day. She does not have any episodes of chest pain or shortness of breath. She thinks she is able to walk better now than she was at the first of this year   October 05, 2014:  Destiny Andersen is a 70 y.o. female who presents for follow up of her mitral valve prolapse.   Oct. 26, 2016:  Destiny Andersen is doing well. Still exercising .   Had an episode of abdominal pain / chest pressure in May .  Has felt well since that time Walks 2-3 miles a day without DOE,  No CP   October 10, 2015: Back for eval. Echo this am shows severe MR with flail scallop  Walks her dog without any problems. Climbs her stairs without any problems  No CP , no dypsnea   Past Medical History  Diagnosis Date  . Risk for falls   . Fatigue   . Chronic headaches     "? Migraines"  . Routine general medical examination at a health care facility   . Medicare annual wellness visit, subsequent   . Family history of breast cancer     Mother  . Elevated C-reactive protein (CRP)   . Cancer [facility]) 1983    Cervical cancer    . Hyperpotassemia     Migrated  . Heart murmur     ECHO 02/10/13 EF = 60-65%, moderate posterior mitral valve prolapse w/possible flail segment, severe MR, mitral regurgitant jet is anteriorly directed.  . MR (mitral regurgitation)     ECHO 02/10/13 EF = 60-65%, moderate posterior mitral valve prolapse w/possible flail segment, severe MR, mitral regurgitant jet is anteriorly directed.  . Mitral valve anterior leaflet prolapse     ECHO 02/10/13 EF = 60-65%, moderate posterior mitral valve prolapse w/possible flail segment, severe MR, mitral regurgitant jet is anteriorly directed.    Past Surgical History  Procedure Laterality Date  . Total abdominal hysterectomy  1983    Cervical cancer 1983  . Colonoscopy  08/2001    Colon cancer screening in 01/2012  . Breast biopsy  1998    benign cyst  . Laparoscopic cholecystectomy  1990     Current Outpatient Prescriptions  Medication Sig Dispense Refill  . aspirin 81 MG tablet Take 81 mg by mouth daily.    . calcium citrate-vitamin D (CITRACAL+D) 315-200 MG-UNIT tablet Take 1 tablet by mouth 3 (three) times daily. VITAMIN D  DOSAGE IS 250MG  INSTEAD OF 200     No current facility-administered medications for this visit.    Allergies:   Penicillins; Codeine; Doxycycline hyclate; and Ultram    Social History:  The patient  reports that she quit smoking about 30 years ago. Her smoking use included Cigarettes. She quit after 25 years of use. She does not have any smokeless tobacco history on file. She reports that she drinks about 1.0 oz of alcohol per week. She reports that she does not use illicit drugs.   Family History:  The patient's family history includes AAA (abdominal aortic aneurysm) in her father; CAD in her father and mother; Cancer in her brother and mother; Heart disease in her father and mother; High Cholesterol in her brother; Hypertension in her brother, father, and mother.    ROS:  Please see the history of present illness.     Review of Systems: Constitutional:  denies fever, chills, diaphoresis, appetite change and fatigue.  HEENT: denies photophobia, eye pain, redness, hearing loss, ear pain, congestion, sore throat, rhinorrhea, sneezing, neck pain, neck stiffness and tinnitus.  Respiratory: denies SOB, DOE, cough, chest tightness, and wheezing.  Cardiovascular: denies chest pain, palpitations and leg swelling.  Gastrointestinal: denies nausea, vomiting, abdominal pain, diarrhea, constipation, blood in stool.  Genitourinary: denies dysuria, urgency, frequency, hematuria, flank pain and difficulty urinating.  Musculoskeletal: denies  myalgias, back pain, joint swelling, arthralgias and gait problem.   Skin: denies pallor, rash and wound.  Neurological: denies dizziness, seizures, syncope, weakness, light-headedness, numbness and headaches.   Hematological: denies adenopathy, easy bruising, personal or family bleeding history.  Psychiatric/ Behavioral: denies suicidal ideation, mood changes, confusion, nervousness, sleep disturbance and agitation.       All other systems are reviewed and negative.    PHYSICAL EXAM: VS:  BP 110/78 mmHg  Pulse 60  Ht 5\' 4"  (1.626 m)  Wt 125 lb 1.9 oz (56.754 kg)  BMI 21.47 kg/m2 , BMI Body mass index is 21.47 kg/(m^2). GEN: Well nourished, well developed, in no acute distress HEENT: normal Neck: no JVD, carotid bruits, or masses Cardiac: RRR; she has a late systolic 99991111  murmur. No  rubs, or gallops,no edema , the heart is not hyperdynamic.  Respiratory:  clear to auscultation bilaterally, normal work of breathing GI: soft, nontender, nondistended, + BS,  + pulsitile abdominal mass,  MS: no deformity or atrophy Skin: warm and dry, no rash Neuro:  Strength and sensation are intact Psych: normal   EKG:  EKG is ordered today. NSR at 67.  Normal ECG     Recent Labs: No results found for requested labs within last 365 days.    Lipid Panel No results found  for: CHOL, TRIG, HDL, CHOLHDL, VLDL, LDLCALC, LDLDIRECT    Wt Readings from Last 3 Encounters:  10/10/15 125 lb 1.9 oz (56.754 kg)  04/13/15 124 lb 12.8 oz (56.609 kg)  10/05/14 129 lb (58.514 kg)      Other studies Reviewed: Additional studies/ records that were reviewed today include: . Review of the above records demonstrates:    ASSESSMENT AND PLAN:  1.  Mitral regurgitation:  Severe by todays echo. Had a long discussion. She is completely asymptomatic.    We talked about cath and TEE.    We will get a TEE on Friday .   She will need a cardiac cath at some point.   We could arrange for her to have this prior to her seeing Dr. Roxy Manns if neede.  3. pulsitile abdominal aorta Has a hx of MVP.  Abdominal duplex shows no evidence of AAA  i will see her in 6 months.   Anticipate repeat echo in 12 months   Current medicines are reviewed at length with the patient today.  The patient does not have concerns regarding medicines.  The following changes have been made:  no change  Labs/ tests ordered today include:  No orders of the defined types were placed in this encounter.    Disposition:   FU with me in 6 months    Signed, Cheo Selvey, Wonda Cheng, MD  10/10/2015 2:29 PM    [facility] [address], [place], [place]  [address] Phone: [phone]; Fax: [phone]

## 2015-10-11 ENCOUNTER — Other Ambulatory Visit: Payer: Self-pay | Admitting: Nurse Practitioner

## 2015-10-11 ENCOUNTER — Other Ambulatory Visit (INDEPENDENT_AMBULATORY_CARE_PROVIDER_SITE_OTHER): Payer: Medicare Other | Admitting: *Deleted

## 2015-10-11 ENCOUNTER — Encounter: Payer: Self-pay | Admitting: Nurse Practitioner

## 2015-10-11 DIAGNOSIS — I341 Nonrheumatic mitral (valve) prolapse: Secondary | ICD-10-CM

## 2015-10-12 ENCOUNTER — Telehealth: Payer: Self-pay | Admitting: *Deleted

## 2015-10-12 LAB — PROTIME-INR
INR: 1.07 (ref <1.50)
Prothrombin Time: 14 seconds (ref 11.6–15.2)

## 2015-10-12 LAB — CBC WITH DIFFERENTIAL/PLATELET
BASOS PCT: 0 %
Basophils Absolute: 0 cells/uL (ref 0–200)
EOS ABS: 260 {cells}/uL (ref 15–500)
Eosinophils Relative: 5 %
HEMATOCRIT: 37.2 % (ref 35.0–45.0)
HEMOGLOBIN: 12.1 g/dL (ref 11.7–15.5)
LYMPHS ABS: 1664 {cells}/uL (ref 850–3900)
Lymphocytes Relative: 32 %
MCH: 32.3 pg (ref 27.0–33.0)
MCHC: 32.5 g/dL (ref 32.0–36.0)
MCV: 99.2 fL (ref 80.0–100.0)
MONO ABS: 520 {cells}/uL (ref 200–950)
MPV: 11.2 fL (ref 7.5–12.5)
Monocytes Relative: 10 %
NEUTROS ABS: 2756 {cells}/uL (ref 1500–7800)
Neutrophils Relative %: 53 %
Platelets: 211 10*3/uL (ref 140–400)
RBC: 3.75 MIL/uL — AB (ref 3.80–5.10)
RDW: 13.4 % (ref 11.0–15.0)
WBC: 5.2 10*3/uL (ref 3.8–10.8)

## 2015-10-12 LAB — BASIC METABOLIC PANEL
BUN: 14 mg/dL (ref 7–25)
CHLORIDE: 105 mmol/L (ref 98–110)
CO2: 25 mmol/L (ref 20–31)
Calcium: 10 mg/dL (ref 8.6–10.4)
Creat: 0.93 mg/dL (ref 0.60–0.93)
GLUCOSE: 93 mg/dL (ref 65–99)
POTASSIUM: 4.4 mmol/L (ref 3.5–5.3)
Sodium: 141 mmol/L (ref 135–146)

## 2015-10-12 NOTE — Telephone Encounter (Signed)
Pt has been notified of lab results by phone with verbal understanding. Pt aware ok to proceed with TEE 4/28.

## 2015-10-12 NOTE — Telephone Encounter (Signed)
Follow Up  Pt states that she missed the call but please call back with the results. She has her cell now

## 2015-10-12 NOTE — Telephone Encounter (Signed)
Lmtcb on both home and cell # for lab results; ok to proceed with TEE 4/28.

## 2015-10-14 ENCOUNTER — Encounter (HOSPITAL_COMMUNITY): Payer: Self-pay | Admitting: *Deleted

## 2015-10-14 ENCOUNTER — Ambulatory Visit (HOSPITAL_COMMUNITY)
Admission: RE | Admit: 2015-10-14 | Discharge: 2015-10-14 | Disposition: A | Discharge status: Home / Self Care | Payer: Medicare Other | Source: Ambulatory Visit | Attending: Cardiovascular Disease | Admitting: Cardiovascular Disease

## 2015-10-14 ENCOUNTER — Ambulatory Visit (HOSPITAL_BASED_OUTPATIENT_CLINIC_OR_DEPARTMENT_OTHER)
Admission: RE | Admit: 2015-10-14 | Discharge: 2015-10-14 | Disposition: A | Discharge status: Home / Self Care | Payer: Medicare Other | Source: Ambulatory Visit | Attending: Cardiovascular Disease | Admitting: Cardiovascular Disease

## 2015-10-14 ENCOUNTER — Encounter (HOSPITAL_COMMUNITY)
Admission: RE | Disposition: A | Discharge status: Home / Self Care | Payer: Self-pay | Source: Ambulatory Visit | Attending: Cardiovascular Disease

## 2015-10-14 DIAGNOSIS — I341 Nonrheumatic mitral (valve) prolapse: Secondary | ICD-10-CM | POA: Diagnosis present

## 2015-10-14 DIAGNOSIS — I340 Nonrheumatic mitral (valve) insufficiency: Secondary | ICD-10-CM | POA: Insufficient documentation

## 2015-10-14 DIAGNOSIS — Z87891 Personal history of nicotine dependence: Secondary | ICD-10-CM | POA: Diagnosis not present

## 2015-10-14 DIAGNOSIS — Z8541 Personal history of malignant neoplasm of cervix uteri: Secondary | ICD-10-CM | POA: Diagnosis not present

## 2015-10-14 DIAGNOSIS — Q211 Atrial septal defect: Secondary | ICD-10-CM | POA: Diagnosis not present

## 2015-10-14 DIAGNOSIS — Z7982 Long term (current) use of aspirin: Secondary | ICD-10-CM | POA: Insufficient documentation

## 2015-10-14 HISTORY — PX: TEE WITHOUT CARDIOVERSION: SHX5443

## 2015-10-14 SURGERY — ECHOCARDIOGRAM, TRANSESOPHAGEAL
Anesthesia: Moderate Sedation

## 2015-10-14 MED ORDER — MIDAZOLAM HCL 5 MG/ML IJ SOLN
INTRAMUSCULAR | Status: AC
  Filled 2015-10-14: qty 2

## 2015-10-14 MED ORDER — BUTAMBEN-TETRACAINE-BENZOCAINE 2-2-14 % EX AERO
INHALATION_SPRAY | CUTANEOUS | Status: DC | PRN
Start: 2015-10-14 — End: 2015-10-14
  Administered 2015-10-14: 2 via TOPICAL

## 2015-10-14 MED ORDER — SODIUM CHLORIDE 0.9 % IV SOLN: INTRAVENOUS | Status: DC

## 2015-10-14 MED ORDER — FENTANYL CITRATE (PF) 100 MCG/2ML IJ SOLN
INTRAMUSCULAR | Status: DC | PRN
  Administered 2015-10-14 (×2): 25 ug via INTRAVENOUS

## 2015-10-14 MED ORDER — FENTANYL CITRATE (PF) 100 MCG/2ML IJ SOLN
INTRAMUSCULAR | Status: AC
  Filled 2015-10-14: qty 2

## 2015-10-14 MED ORDER — MIDAZOLAM HCL 10 MG/2ML IJ SOLN
INTRAMUSCULAR | Status: DC | PRN
  Administered 2015-10-14 (×2): 2 mg via INTRAVENOUS

## 2015-10-14 MED ORDER — LIDOCAINE VISCOUS 2 % MT SOLN
OROMUCOSAL | Status: AC
  Filled 2015-10-14: qty 15

## 2015-10-14 MED ORDER — SODIUM CHLORIDE 0.9 % IV SOLN
INTRAVENOUS | Status: DC
  Administered 2015-10-14: 500 mL via INTRAVENOUS

## 2015-10-14 NOTE — Discharge Instructions (Signed)

## 2015-10-14 NOTE — H&P (View-Only) (Signed)
Cardiology Office Note   Date:  10/10/2015   ID:  Destiny Andersen, DOB May 22, 1946, MRN [id number]  PCP:  Marjorie Smolder, MD  Cardiologist:   Thayer Headings, MD   Chief Complaint  Patient presents with  . Follow-up    mitral valve surgery    1. Severe mitral regurgitation 2.   History of Present Illness:  Destiny Andersen is a 70 yo with hx of MVP for years. She has been very healthy and has never had any cardiac symptoms. She works out regularly. She is working with a Clinical research associate as of this year.   October 08, 2013:  Destiny Andersen is doing well. She had an echo recently that revealed severe MR with prolapse of the posterior leaflet. She has slight dyspnea walking up a hill on her usual route but otherwise, she does not have any problems. Works out with a Clinical research associate once a week.   Oct. 20, 2015:  Destiny Andersen is doing well.  She is walking regularly - able to walk her usual route in the mornings - 2 - 3 miles a day. She does not have any episodes of chest pain or shortness of breath. She thinks she is able to walk better now than she was at the first of this year   October 05, 2014:  Destiny Andersen is a 70 y.o. female who presents for follow up of her mitral valve prolapse.   Oct. 26, 2016:  Destiny Andersen is doing well. Still exercising .   Had an episode of abdominal pain / chest pressure in May .  Has felt well since that time Walks 2-3 miles a day without DOE,  No CP   October 10, 2015: Back for eval. Echo this am shows severe MR with flail scallop  Walks her dog without any problems. Climbs her stairs without any problems  No CP , no dypsnea   Past Medical History  Diagnosis Date  . Risk for falls   . Fatigue   . Chronic headaches     "? Migraines"  . Routine general medical examination at a health care facility   . Medicare annual wellness visit, subsequent   . Family history of breast cancer     Mother  . Elevated C-reactive protein (CRP)   . Cancer [facility]) 1983    Cervical cancer    . Hyperpotassemia     Migrated  . Heart murmur     ECHO 02/10/13 EF = 60-65%, moderate posterior mitral valve prolapse w/possible flail segment, severe MR, mitral regurgitant jet is anteriorly directed.  . MR (mitral regurgitation)     ECHO 02/10/13 EF = 60-65%, moderate posterior mitral valve prolapse w/possible flail segment, severe MR, mitral regurgitant jet is anteriorly directed.  . Mitral valve anterior leaflet prolapse     ECHO 02/10/13 EF = 60-65%, moderate posterior mitral valve prolapse w/possible flail segment, severe MR, mitral regurgitant jet is anteriorly directed.    Past Surgical History  Procedure Laterality Date  . Total abdominal hysterectomy  1983    Cervical cancer 1983  . Colonoscopy  08/2001    Colon cancer screening in 01/2012  . Breast biopsy  1998    benign cyst  . Laparoscopic cholecystectomy  1990     Current Outpatient Prescriptions  Medication Sig Dispense Refill  . aspirin 81 MG tablet Take 81 mg by mouth daily.    . calcium citrate-vitamin D (CITRACAL+D) 315-200 MG-UNIT tablet Take 1 tablet by mouth 3 (three) times daily. VITAMIN D  DOSAGE IS 250MG  INSTEAD OF 200     No current facility-administered medications for this visit.    Allergies:   Penicillins; Codeine; Doxycycline hyclate; and Ultram    Social History:  The patient  reports that she quit smoking about 30 years ago. Her smoking use included Cigarettes. She quit after 25 years of use. She does not have any smokeless tobacco history on file. She reports that she drinks about 1.0 oz of alcohol per week. She reports that she does not use illicit drugs.   Family History:  The patient's family history includes AAA (abdominal aortic aneurysm) in her father; CAD in her father and mother; Cancer in her brother and mother; Heart disease in her father and mother; High Cholesterol in her brother; Hypertension in her brother, father, and mother.    ROS:  Please see the history of present illness.     Review of Systems: Constitutional:  denies fever, chills, diaphoresis, appetite change and fatigue.  HEENT: denies photophobia, eye pain, redness, hearing loss, ear pain, congestion, sore throat, rhinorrhea, sneezing, neck pain, neck stiffness and tinnitus.  Respiratory: denies SOB, DOE, cough, chest tightness, and wheezing.  Cardiovascular: denies chest pain, palpitations and leg swelling.  Gastrointestinal: denies nausea, vomiting, abdominal pain, diarrhea, constipation, blood in stool.  Genitourinary: denies dysuria, urgency, frequency, hematuria, flank pain and difficulty urinating.  Musculoskeletal: denies  myalgias, back pain, joint swelling, arthralgias and gait problem.   Skin: denies pallor, rash and wound.  Neurological: denies dizziness, seizures, syncope, weakness, light-headedness, numbness and headaches.   Hematological: denies adenopathy, easy bruising, personal or family bleeding history.  Psychiatric/ Behavioral: denies suicidal ideation, mood changes, confusion, nervousness, sleep disturbance and agitation.       All other systems are reviewed and negative.    PHYSICAL EXAM: VS:  BP 110/78 mmHg  Pulse 60  Ht 5\' 4"  (1.626 m)  Wt 125 lb 1.9 oz (56.754 kg)  BMI 21.47 kg/m2 , BMI Body mass index is 21.47 kg/(m^2). GEN: Well nourished, well developed, in no acute distress HEENT: normal Neck: no JVD, carotid bruits, or masses Cardiac: RRR; she has a late systolic 99991111  murmur. No  rubs, or gallops,no edema , the heart is not hyperdynamic.  Respiratory:  clear to auscultation bilaterally, normal work of breathing GI: soft, nontender, nondistended, + BS,  + pulsitile abdominal mass,  MS: no deformity or atrophy Skin: warm and dry, no rash Neuro:  Strength and sensation are intact Psych: normal   EKG:  EKG is ordered today. NSR at 67.  Normal ECG     Recent Labs: No results found for requested labs within last 365 days.    Lipid Panel No results found  for: CHOL, TRIG, HDL, CHOLHDL, VLDL, LDLCALC, LDLDIRECT    Wt Readings from Last 3 Encounters:  10/10/15 125 lb 1.9 oz (56.754 kg)  04/13/15 124 lb 12.8 oz (56.609 kg)  10/05/14 129 lb (58.514 kg)      Other studies Reviewed: Additional studies/ records that were reviewed today include: . Review of the above records demonstrates:    ASSESSMENT AND PLAN:  1.  Mitral regurgitation:  Severe by todays echo. Had a long discussion. She is completely asymptomatic.    We talked about cath and TEE.    We will get a TEE on Friday .   She will need a cardiac cath at some point.   We could arrange for her to have this prior to her seeing Dr. Roxy Manns if neede.  3. pulsitile abdominal aorta Has a hx of MVP.  Abdominal duplex shows no evidence of AAA  i will see her in 6 months.   Anticipate repeat echo in 12 months   Current medicines are reviewed at length with the patient today.  The patient does not have concerns regarding medicines.  The following changes have been made:  no change  Labs/ tests ordered today include:  No orders of the defined types were placed in this encounter.    Disposition:   FU with me in 6 months    Signed, [name], Wonda Cheng, MD  10/10/2015 2:29 PM    [facility] [address], [place], [place]  [address] Phone: [phone]; Fax: [phone]

## 2015-10-14 NOTE — Progress Notes (Signed)
  Echocardiogram Echocardiogram Transesophageal has been performed.  Johny Chess 10/14/2015, 9:47 AM

## 2015-10-14 NOTE — CV Procedure (Signed)
    Transesophageal Echocardiogram Note  MARQUEISHA KIERCE [id number] [birth year]  Procedure: Transesophageal Echocardiogram Indications: MVP with MR   Procedure Details Consent: Obtained Time Out: Verified patient identification, verified procedure, site/side was marked, verified correct patient position, special equipment/implants available, Radiology Safety Procedures followed,  medications/allergies/relevent history reviewed, required imaging and test results available.  Performed  Medications:  During this procedure the patient is administered a total of Versed 50  mg and Fentanyl 4 mcg  to achieve and maintain moderate conscious sedation.  The patient's heart rate, blood pressure, and oxygen saturation are monitored continuously during the procedure. The period of conscious sedation is 30  minutes, of which I was present face-to-face 100% of this time.   9:02 to 9:32 am   Left Ventrical:  Normal LV function  Mitral Valve: There is severe prolapse of one of the scallops of the posterior leaflet.   Associated with eccentric MR jet directed anteriorly.    There was no significant reversal of flow in the pulmonary veins   Aortic Valve: normal   Tricuspid Valve: normal,  Trace TR   Pulmonic Valve: trivial PI   Left Atrium/ Left atrial appendage: normal , no thrombi   Atrial septum: there is bowing left to right suggestive of elevated LA pressures.   There is evidence of a PFO with left to right shunting by color doppler   Aorta: normal    Complications: No apparent complications Patient did tolerate procedure well.   Thayer Headings, Brooke Bonito., MD, [facility] 10/14/2015, 9:26 AM

## 2015-10-14 NOTE — Interval H&P Note (Signed)
History and Physical Interval Note:  10/14/2015 8:20 AM  Destiny Andersen  has presented today for surgery, with the diagnosis of MITRAL VALVE PROLAPSE  The various methods of treatment have been discussed with the patient and family. After consideration of risks, benefits and other options for treatment, the patient has consented to  Procedure(s): TRANSESOPHAGEAL ECHOCARDIOGRAM (TEE) (N/A) as a surgical intervention .  The patient's history has been reviewed, patient examined, no change in status, stable for surgery.  I have reviewed the patient's chart and labs.  Questions were answered to the patient's satisfaction.     [name], Wonda Cheng

## 2015-10-15 ENCOUNTER — Encounter (HOSPITAL_COMMUNITY): Payer: Self-pay | Admitting: Cardiovascular Disease

## 2015-10-17 ENCOUNTER — Institutional Professional Consult (permissible substitution) (INDEPENDENT_AMBULATORY_CARE_PROVIDER_SITE_OTHER): Payer: Medicare Other | Admitting: Thoracic Surgery (Cardiothoracic Vascular Surgery)

## 2015-10-17 ENCOUNTER — Telehealth: Payer: Self-pay | Admitting: Cardiovascular Disease

## 2015-10-17 ENCOUNTER — Encounter: Payer: Self-pay | Admitting: Thoracic Surgery (Cardiothoracic Vascular Surgery)

## 2015-10-17 VITALS — BP 128/71 | HR 71 | Resp 16 | Ht 64.0 in | Wt 125.0 lb

## 2015-10-17 DIAGNOSIS — I340 Nonrheumatic mitral (valve) insufficiency: Secondary | ICD-10-CM | POA: Diagnosis not present

## 2015-10-17 DIAGNOSIS — I341 Nonrheumatic mitral (valve) prolapse: Secondary | ICD-10-CM | POA: Diagnosis not present

## 2015-10-17 NOTE — Patient Instructions (Signed)
Continue all previous medications without any changes at this time  

## 2015-10-17 NOTE — Telephone Encounter (Signed)
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 Her limit and limitations . Please call If you have any questions ( States that she can pick up the letter from the office )     Thanks

## 2015-10-17 NOTE — Progress Notes (Signed)
[address]       [place] [address]             [phone]     CARDIOTHORACIC SURGERY CONSULTATION REPORT  Referring Provider is [name], Wonda Cheng, MD PCP is Marjorie Smolder, MD  Chief Complaint  Patient presents with  . Mitral Regurgitation    SEVERE...TEE 10/14/15    HPI:  Patient is a 70 year old female with long history of mitral valve prolapse who has been referred for surgical consultation to discuss treatment options for management of severe asymptomatic primary mitral regurgitation. The patient reportedly was told that she had mitral valve prolapse many years ago. Approximately 3 years ago she was noted by her primary care physician to have a prominent murmur on physical exam and echocardiogram revealed the presence of mitral valve prolapse with severe mitral regurgitation.  She has been followed carefully by Dr. Acie Fredrickson ever since.  She has remained physically active and reportedly asymptomatic. She was seen in follow-up recently by Dr. Acie Fredrickson and transthoracic echocardiogram revealed mitral valve prolapse with a flail segment of the posterior leaflet of mitral valve and severe mitral regurgitation. Left ventricular size and systolic function remains normal. Transesophageal echocardiogram was performed, confirming the presence of a flail segment of the posterior leaflet with ruptured chordae tendineae. The patient was referred for surgical consultation.  The patient is married and lives locally in [place] with her husband. 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  Now that she has been completely retired for 10 years, the patient remains fairly active physically. She walks her dog every day and exercises regularly with a trainer. She does not typically push herself with intense aerobic physical activity but she certainly  remains active. She denies any symptoms of exertional shortness of breath or fatigue. She denies any recent significant change in her exercise tolerance, although she states that she would get winded if she pushed herself strenuously. She has never experienced any exertional chest pain or chest tightness. She denies any history of PND, orthopnea, palpitations, dizzy spells, or syncope.  Past Medical History  Diagnosis Date  . Risk for falls   . Fatigue   . Chronic headaches     "? Migraines"  . Routine general medical examination at a health care facility   . Medicare annual wellness visit, subsequent   . Family history of breast cancer     Mother  . Elevated C-reactive protein (CRP)   . Cancer [facility]) 1983    Cervical cancer  . Hyperpotassemia     Migrated  . Heart murmur     ECHO 02/10/13 EF = 60-65%, moderate posterior mitral valve prolapse w/possible flail segment, severe MR, mitral regurgitant jet is anteriorly directed.  . MR (mitral regurgitation)     ECHO 02/10/13 EF = 60-65%, moderate posterior mitral valve prolapse w/possible flail segment, severe MR, mitral regurgitant jet is anteriorly directed.  . Mitral valve prolapse     Past Surgical History  Procedure Laterality Date  . Total abdominal hysterectomy  1983    Cervical cancer 1983  . Colonoscopy  08/2001    Colon cancer screening in 01/2012  . Breast biopsy  1998    benign cyst  . Laparoscopic cholecystectomy  1990  . Tee without cardioversion N/A 10/14/2015    Procedure: TRANSESOPHAGEAL ECHOCARDIOGRAM (TEE);  Surgeon: Thayer Headings, MD;  Location: [facility];  Service: Cardiovascular;  Laterality: N/A;    Family History  Problem Relation Age of Onset  . Cancer Mother     breast  . Hypertension Mother   . Heart disease Mother   . CAD Mother   . AAA (abdominal aortic aneurysm) Father   . CAD Father   . Hypertension Father   . Heart disease Father   . High Cholesterol Brother   . Cancer Brother     skin    . Hypertension Brother     Social History   Social History  . Marital Status: Married    Spouse Name: N/A  . Number of Children: 1  . Years of Education: N/A   Occupational History  . Not on file.   Social History Main Topics  . Smoking status: Former Smoker -- 25 years    Types: Cigarettes    Quit date: 06/18/1985  . Smokeless tobacco: Not on file  . Alcohol Use: 1.0 oz/week    2 Standard drinks or equivalent per week     Comment: Beer and liquor  . Drug Use: No  . Sexual Activity: No   Other Topics Concern  . Not on file   Social History Narrative    Current Outpatient Prescriptions  Medication Sig Dispense Refill  . aspirin 81 MG tablet Take 81 mg by mouth daily.    . calcium citrate-vitamin D (CITRACAL+D) 315-200 MG-UNIT tablet Take 1 tablet by mouth 3 (three) times daily. VITAMIN D DOSAGE IS 250MG  INSTEAD OF 200     No current facility-administered medications for this visit.    Allergies  Allergen Reactions  . Penicillins Anaphylaxis  . Codeine     AND CODEINE DERIVATIVES  . Doxycycline Hyclate Nausea And Vomiting  . Ultram [Tramadol] Nausea And Vomiting      Review of Systems:   General:  normal appetite, normal energy, no weight gain, no weight loss, no fever  Cardiac:  no chest pain with exertion, no chest pain at rest, no SOB with no exertion, no resting SOB, no PND, no orthopnea, no palpitations, no arrhythmia, no atrial fibrillation, + chronic trace LE edema, no dizzy spells, no syncope  Respiratory:  no shortness of breath, no home oxygen, no productive cough, no dry cough, no bronchitis, no wheezing, no hemoptysis, no asthma, no pain with inspiration or cough, no sleep apnea, no CPAP at night  GI:   no difficulty swallowing, no reflux, no frequent heartburn, no hiatal hernia, no abdominal pain, no constipation, no diarrhea, no hematochezia, no hematemesis, no melena  GU:   no dysuria,  no frequency, no urinary tract infection, no hematuria, no  enlarged prostate, no kidney stones, no kidney disease  Vascular:  no pain suggestive of claudication, no pain in feet, no leg cramps, no varicose veins, no DVT, no non-healing foot ulcer  Neuro:   no stroke, no TIA's, no seizures, no headaches, no temporary blindness one eye,  no slurred speech, no peripheral neuropathy, no chronic pain, no instability of gait, no memory/cognitive dysfunction  Musculoskeletal: no arthritis, no joint swelling, no myalgias, no difficulty walking, normal mobility   Skin:   no rash, no itching, no skin infections, no pressure sores or ulcerations  Psych:   no anxiety, no depression, no nervousness, no unusual recent stress  Eyes:   no blurry vision, + floaters, no recent vision changes, + wears glasses or contacts  ENT:   no hearing  loss, no loose or painful teeth, no dentures, last saw dentist November 2016  Hematologic:  no easy bruising, no abnormal bleeding, no clotting disorder, no frequent epistaxis  Endocrine:  no diabetes, does not check CBG's at home     Physical Exam:   BP 128/71 mmHg  Pulse 71  Resp 16  Ht 5\' 4"  (1.626 m)  Wt 125 lb (56.7 kg)  BMI 21.45 kg/m2  SpO2 98%  General:    well-appearing  HEENT:  Unremarkable   Neck:   no JVD, no bruits, no adenopathy   Chest:   clear to auscultation, symmetrical breath sounds, no wheezes, no rhonchi   CV:   RRR, grade IV/VI holosystolic murmur   Abdomen:  soft, non-tender, no masses   Extremities:  warm, well-perfused, pulses palpable, no LE edema  Rectal/GU  Deferred  Neuro:   Grossly non-focal and symmetrical throughout  Skin:   Clean and dry, no rashes, no breakdown   Diagnostic Tests:  Transthoracic Echocardiography  Patient:  Destiny Andersen, Brouillard MR #:    [id number] Study Date: 10/01/2013 Gender:   F Age:    61 Height:   162.6cm Weight:   59kg BSA:    1.86m^2 Pt. Status: Room:  Rex Kras, Jasmine Awe  [name], Carrington Clamp,  Dalton SONOGRAPHER Victorio Palm, RDCS PERFORMING  [facility], Outpatient cc:  ------------------------------------------------------------ LV EF: 60% -  65%  ------------------------------------------------------------ Indications:   Mitral valve prolapse 424.0. Murmur 785.2.  ------------------------------------------------------------ History:  PMH: Acquired from the patient and from the patient's chart. [id number] Systolic murmur. Severe mitral regurgitation. Moderate mitral valve prolapse. Risk factors: Former tobacco use.  ------------------------------------------------------------ Study Conclusions  - Left ventricle: The cavity size was normal. Wall thickness was normal. Systolic function was normal. The estimated ejection fraction was in the range of 60% to 65%. Wall motion was normal; there were no regional wall motion abnormalities. Features are consistent with a pseudonormal left ventricular filling pattern, with concomitant abnormal relaxation and increased filling pressure (grade 2 diastolic dysfunction). - Aortic valve: There was no stenosis. - Mitral valve: Prolapse of posterior mitral leaflet withpartial flail P2 segment . Severe, anteriorly-directed mitral regurgitation. - Left atrium: The atrium was mildly dilated. - Tricuspid valve: Peak RV-RA gradient: 61mm Hg (S). - Pulmonary arteries: PA peak pressure: 11mm Hg (S). - Inferior vena cava: The vessel was normal in size; the respirophasic diameter changes were in the normal range (= 50%); findings are consistent with normal central venous pressure. Impressions:  - Normal LV size with EF 60-65%. Moderate diastolic dysfunction. Severe mitral regurgitation with flail P2 segment of posterior leaflet. Normal RV size and systolic function. Borderline pulmonary hypertension.  ------------------------------------------------------------ Labs, prior tests, procedures, and  surgery: Echocardiography (August 2014).  The mitral valve showed severe regurgitation. EF was 65%. Moderate posterior leaflet mitral valve prolapse with possible flail segment.  Transthoracic echocardiography. M-mode, complete 2D, spectral Doppler, and color Doppler. Height: Height: 162.6cm. Height: 64in. Weight: Weight: 59kg. Weight: 129.7lb. Body mass index: BMI: 22.3kg/m^2. Body surface area:  BSA: 1.45m^2. Blood pressure:   119/70. Patient status: Outpatient. Location: [place] [facility]  ------------------------------------------------------------  ------------------------------------------------------------ Left ventricle: The cavity size was normal. Wall thickness was normal. Systolic function was normal. The estimated ejection fraction was in the range of 60% to 65%. Wall motion was normal; there were no regional wall motion abnormalities. Features are consistent with a pseudonormal left ventricular filling pattern, with concomitant abnormal relaxation and increased filling pressure (grade 2 diastolic dysfunction).  ------------------------------------------------------------  Aortic valve:  Trileaflet; mildly calcified leaflets. Doppler:  There was no stenosis.  No regurgitation.  ------------------------------------------------------------ Aorta: Aortic root: The aortic root was normal in size. Ascending aorta: The ascending aorta was normal in size.  ------------------------------------------------------------ Mitral valve: Prolapse of posterior mitral leaflet withpartial flail P2 segment . Severe, anteriorly-directed mitral regurgitation. Doppler:  There was no evidence for stenosis.   Peak gradient: 32mm Hg (D).  ------------------------------------------------------------ Left atrium: The atrium was mildly dilated.  ------------------------------------------------------------ Right ventricle: The cavity size was normal.  Systolic function was normal.  ------------------------------------------------------------ Pulmonic valve:  Structurally normal valve.  Cusp separation was normal. Doppler: Transvalvular velocity was within the normal range. No regurgitation.  ------------------------------------------------------------ Tricuspid valve:  Doppler:  Trivial regurgitation.  ------------------------------------------------------------ Right atrium: The atrium was normal in size.  ------------------------------------------------------------ Pericardium: There was no pericardial effusion.  ------------------------------------------------------------ Systemic veins: Inferior vena cava: The vessel was normal in size; the respirophasic diameter changes were in the normal range (= 50%); findings are consistent with normal central venous pressure.  ------------------------------------------------------------  2D measurements    Normal Doppler measurements  Normal Left ventricle         Main pulmonary LVID ED,   35 mm   43-52  artery chord,             Pressure,   36 mm Hg =30 PLAX              S LVID ES,  21.1 mm   23-38  Left ventricle chord,             Ea, lat   6.91 cm/s ------ PLAX              ann, tiss FS, chord,  40 %   >29   DP PLAX              E/Ea, lat 23.44    ------ LVPW, ED  7.75 mm   ------ ann, tiss IVS/LVPW  1.16    <1.3  DP ratio, ED           Ea, med   8.77 cm/s ------ Ventricular septum       ann, tiss IVS, ED    9 mm   ------ DP LVOT              E/Ea, med 18.47    ------ Diam, S   20 mm   ------ ann, tiss Area    3.14 cm^2  ------ DP Diam     20 mm   ------ LVOT Aorta             Peak vel,  125 cm/s ------ Root diam,  29 mm   ------ S ED               VTI, S    21.8 cm  ------ Left atrium          Peak      6 mm Hg ------ AP dim   2.39 cm/m^2 <2.2  gradient, index             S                Stroke vol 68.5 ml  ------                Stroke    42 ml/m^ ------                index      2  Mitral valve                Peak E vel  162 cm/s ------                Peak A vel  138 cm/s ------                Decelerati  254 ms  150-23                on time        0                Peak     10 mm Hg ------                gradient,                D                Peak E/A   1.2    ------                ratio                Regurg   32.1 cm/s ------                alias vel,                PISA                Regurg    12 mm  ------                radius,                PISA                Max regurg  539 cm/s ------                vel                Regurg VTI  170 cm  ------                ERO, PISA  0.54 cm^2 ------                Regurg    92 ml  ------                vol, PISA                RF, PISA  57.33 %   ------                Tricuspid valve                Regurg    288 cm/s ------                peak vel                Peak RV-RA  33 mm Hg ------                gradient,                S                Systemic veins                Estimated   3 mm Hg ------  CVP                Right ventricle                Sa vel,   15.4 cm/s ------                lat ann,                tiss DP  ------------------------------------------------------------ Prepared and Electronically Authenticated by  Loralie Champagne 2015-04-16T21:[id number]   Transthoracic Echocardiography  Patient: Aj, Stellwagen MR #: [id number] Study Date: 10/10/2015 Gender: F Age: 25 Height: 162.6 cm Weight: 56.6 kg BSA: 1.6 m^2 Pt. Status: Room:  ATTENDING Jenkins Rouge, M.D. REFERRING Mertie Moores, M.D. SONOGRAPHER Oletta Lamas, Will PERFORMING [facility], Outpatient ORDERING [name], Jr  cc:  ------------------------------------------------------------------- LV EF: 60% - 65%  ------------------------------------------------------------------- Indications: (I34.1).  ------------------------------------------------------------------- History: PMH: Acquired from the patient and from the patient&'s chart. Mitral valve prolapse. Mitral regurgitation. Risk factors: Former tobacco use.  ------------------------------------------------------------------- Study Conclusions  - Left ventricle: The cavity size was normal. Wall thickness was  normal. Systolic function was normal. The estimated ejection  fraction was in the range of 60% to 65%. - Mitral valve: Flail segment of poserior leaflet with severe  eccentric MR. Consider f/u TEE, right and left cath and surgical  referral. - Left atrium: The atrium was mildly dilated. - Atrial septum: No defect or patent foramen ovale was identified.  Transthoracic echocardiography. M-mode, complete 2D, spectral Doppler, and color Doppler. Birthdate: Patient birthdate: 1946-02-19. Age: Patient is 70 yr old. Sex: Gender: female. BMI: 21.4 kg/m^2. Blood pressure: 100/66  Patient status: Outpatient. Study date: Study date: 10/10/2015. Study time: 01:01 PM. Location: [place] [facility]  -------------------------------------------------------------------  ------------------------------------------------------------------- Left ventricle: The cavity size was normal. Wall thickness was normal. Systolic function was normal. The estimated ejection fraction was in the range of 60% to 65%.  ------------------------------------------------------------------- Aortic valve: Structurally normal valve. Cusp separation was normal. Doppler: Transvalvular velocity was within the normal range. There was no stenosis. There was no regurgitation.  ------------------------------------------------------------------- Aorta: The aorta was normal, not dilated, and non-diseased.  ------------------------------------------------------------------- Mitral valve: Flail segment of poserior leaflet with severe eccentric MR. Consider f/u TEE, right and left cath and surgical referral. Doppler: Peak gradient (D): 12 mm Hg.  ------------------------------------------------------------------- Left atrium: The atrium was mildly dilated.  ------------------------------------------------------------------- Atrial septum: No defect or patent foramen ovale was identified.  ------------------------------------------------------------------- Right ventricle: The cavity size was normal. Wall thickness was normal. Systolic function was normal.  ------------------------------------------------------------------- Pulmonic valve: Doppler: There was trivial regurgitation.  ------------------------------------------------------------------- Tricuspid valve: Doppler: There was mild regurgitation.  ------------------------------------------------------------------- Right atrium: The atrium was normal in  size.  ------------------------------------------------------------------- Pericardium: The pericardium was normal in appearance.  ------------------------------------------------------------------- Post procedure conclusions Ascending Aorta:  - The aorta was normal, not dilated, and non-diseased.  ------------------------------------------------------------------- Measurements  Left ventricle Value Reference LV ID, ED, PLAX chordal (L) 36.7 mm 43 - 52 LV ID, ES, PLAX chordal (L) 19.7 mm 23 - 38 LV fx shortening, PLAX chordal 46 % >=29 LV PW thickness, ED 7.17 mm --------- IVS/LV PW ratio, ED (H) 1.39 <=1.3 Stroke volume, 2D 45 ml --------- Stroke volume/bsa, 2D 28 ml/m^2 --------- LV ejection fraction, 1-p A4C 64 % --------- LV end-diastolic volume, 2-p 86 ml --------- LV end-systolic volume, 2-p 29 ml --------- LV ejection fraction, 2-p 66 % --------- Stroke volume, 2-p 57 ml --------- LV end-diastolic volume/bsa, 2-p 54 ml/m^2 --------- LV end-systolic volume/bsa, 2-p 18 ml/m^2 ---------  Stroke volume/bsa, 2-p 35.6 ml/m^2 --------- LV e&', lateral 7.31 cm/s --------- LV E/e&', lateral 23.39 --------- LV e&', medial 7.31 cm/s --------- LV E/e&', medial 23.39 --------- LV e&', average 7.31 cm/s --------- LV E/e&', average 23.39 ---------  Ventricular septum Value Reference IVS thickness,  ED 9.97 mm ---------  LVOT Value Reference LVOT ID, S 17 mm --------- LVOT area 2.27 cm^2 --------- LVOT ID 17 mm --------- LVOT peak velocity, S 107 cm/s --------- LVOT mean velocity, S 70.2 cm/s --------- LVOT VTI, S 20 cm --------- LVOT peak gradient, S 5 mm Hg --------- Stroke volume (SV), LVOT DP 45.4 ml --------- Stroke index (SV/bsa), LVOT DP 28.4 ml/m^2 ---------  Aorta Value Reference Aortic root ID, ED 27 mm --------- Ascending aorta ID, A-P, S 28 mm ---------  Left atrium Value Reference LA ID, A-P, ES 39 mm --------- LA ID/bsa, A-P (H) 2.44 cm/m^2 <=2.2 LA volume, S 56 ml --------- LA volume/bsa, S 35 ml/m^2 --------- LA volume, ES, 1-p A4C 70 ml --------- LA volume/bsa, ES, 1-p A4C 43.7 ml/m^2 --------- LA volume, ES, 1-p A2C 39 ml --------- LA volume/bsa, ES, 1-p A2C 24.4 ml/m^2 ---------  Mitral valve Value Reference Mitral E-wave peak velocity 171 cm/s --------- Mitral A-wave peak velocity 122 cm/s --------- Mitral deceleration time (H) 313 ms 150 - 230 Mitral peak gradient, D 12 mm Hg --------- Mitral E/A ratio, peak 1.4 ---------  Pulmonary  arteries Value Reference PA pressure, S, DP 23 mm Hg <=30  Tricuspid valve Value Reference Tricuspid regurg peak velocity 224 cm/s --------- Tricuspid peak RV-RA gradient 20 mm Hg ---------  Systemic veins Value Reference Estimated CVP 3 mm Hg ---------  Right ventricle Value Reference RV pressure, S, DP 23 mm Hg <=30 RV s&', lateral, S 13.8 cm/s ---------  Legend: (L) and (H) mark values outside specified reference range.  ------------------------------------------------------------------- Prepared and Electronically Authenticated by  Jenkins Rouge, M.D. 2017-04-24T13:51:28   Transesophageal Echocardiography  Patient: Meeah, Parquette MR #: [id number] Study Date: 10/14/2015 Gender: F Age: 51 Height: 162.6 cm Weight: 56.8 kg BSA: 1.6 m^2 Pt. Status: Room:  ATTENDING Mertie Moores, M.D. PERFORMING Mertie Moores, M.D. SONOGRAPHER Johny Chess, RDCS, CCT ADMITTING [name], Peggyann Juba [name], Jr  cc:  -------------------------------------------------------------------  ------------------------------------------------------------------- Indications: Mitral regurgitation 424.0.  ------------------------------------------------------------------- Study Conclusions  - Left ventricle: The cavity size was normal. Wall thickness was  normal. Systolic function was normal. - Aortic valve: No evidence of vegetation. - Mitral valve: Severe prolapse, involving the posterior leaflet.  There was moderate to severe regurgitation. - Left atrium: No evidence of thrombus in the atrial cavity or  appendage. - Tricuspid valve: No  evidence of vegetation. - Pulmonic valve: No evidence of vegetation.  Diagnostic transesophageal echocardiography. 2D and color Doppler. Birthdate: Patient birthdate: [birth year]. Age: Patient is 70 yr old. Sex: Gender: female. BMI: 21.5 kg/m^2. Blood pressure:  115/55 Patient status: Outpatient. Study date: Study date: 10/14/2015. Study time: 08:56 AM. Location: Endoscopy.  -------------------------------------------------------------------  ------------------------------------------------------------------- Left ventricle: The cavity size was normal. Wall thickness was normal. Systolic function was normal.  ------------------------------------------------------------------- Aortic valve: Structurally normal valve. Cusp separation was normal. No evidence of vegetation. Doppler: There was no regurgitation.  ------------------------------------------------------------------- Mitral valve: There was an eccentric jet of MR that appeared to wrap around the left atrium. There was no evidence of flow reversal in the pulmonary veins. Severe prolapse, involving the posterior leaflet. Doppler: There was no evidence for stenosis. There was moderate to severe regurgitation.  ------------------------------------------------------------------- Left atrium: No evidence of thrombus  in the atrial cavity or appendage.  ------------------------------------------------------------------- Pulmonic valve: Structurally normal valve. Cusp separation was normal. No evidence of vegetation.  ------------------------------------------------------------------- Tricuspid valve: Structurally normal valve. Leaflet separation was normal. No evidence of vegetation. Doppler: There was trivial regurgitation.  ------------------------------------------------------------------- Prepared and Electronically Authenticated by  Mertie Moores,  M.D. 2017-04-28T18:21:07   Impression:  Patient has stage C severe asymptomatic primary mitral regurgitation. I have personally reviewed the patient's recent transthoracic and transesophageal echocardiograms. She has mitral valve prolapse with an obvious flail segment of the middle scallop of the posterior leaflet. There is an eccentric jet of regurgitation that courses anteriorly around the left atrium with severe mitral regurgitation. Left ventricular size and systolic function remains normal. There are no other complicating features. I would expect that the patient's valve could be repaired with a very high degree of confidence and low operative risks.  The patient would likely be a good candidate for minimally invasive approach for surgery, although diagnostic cardiac catheterization and CT angiography would need to be performed prior to surgery.   Plan:  The patient and her husband were counseled at length regarding the indications, risks and potential benefits of mitral valve repair.  The rationale for elective surgery has been explained, including a comparison between surgery and continued medical therapy with close follow-up.  The likelihood of successful and durable valve repair has been discussed with particular reference to the findings of their recent echocardiogram.  Based upon these findings and previous experience, I have quoted them a greater than 95 percent likelihood of successful valve repair.  Alternative surgical approaches were discussed including a comparison between conventional sternotomy and minimally invasive approach. Expectations for the patient's postoperative recovery were discussed. All of her questions have been addressed. The patient is interested in proceeding with surgery but desires to hold off until later this summer for personal reasons. Under the circumstances this seems reasonable. She will discuss matters further with her husband and daughter in call to contact  our office once she has made a more definitive decision on timing.  We will plan to refer her for diagnostic cardiac catheterization and CT angiogram of the chest, abdomen and pelvis prior to her next visit.   I spent in excess of 90 minutes during the conduct of this office consultation and >50% of this time involved direct face-to-face encounter with the patient for counseling and/or coordination of their care.   Valentina Gu. Roxy Manns, MD 10/17/2015 12:55 PM

## 2015-10-18 ENCOUNTER — Other Ambulatory Visit: Payer: Self-pay | Admitting: *Deleted

## 2015-10-18 DIAGNOSIS — I340 Nonrheumatic mitral (valve) insufficiency: Secondary | ICD-10-CM

## 2015-10-18 NOTE — Telephone Encounter (Signed)
Patient came into office regarding exercise instructions.  I reviewed Dr. Elmarie Shiley advice with her and gave her a copy of the note.  I also reviewed the upcoming appointments made by Theodosia Quay, RN.  She verbalized understanding and agreement and will call back if she needs to reschedule.  She thanked me for my help.

## 2015-10-18 NOTE — Telephone Encounter (Signed)
Left message for patient to call back about letter for exercise program

## 2015-10-18 NOTE — Telephone Encounter (Signed)
I have reviewed Dr. Guy Sandifer note. I think she should do mostly cardio She may use light weights - up to perhaps 20 lbs. Nothing heavy. Moderate intensity .

## 2015-10-18 NOTE — Telephone Encounter (Signed)
I spoke with the pt's spouse and left a message for the pt to contact the office.    I have arranged cardiac catheterization on 12/21/15 at 8:30 AM with Dr Burt Knack.  The pt will need to arrive at 6:30.  Appointment has also been arranged with Dr Acie Fredrickson on 12/15/15 for Lab work and [place].

## 2015-12-15 ENCOUNTER — Encounter: Payer: Self-pay | Admitting: Cardiovascular Disease

## 2015-12-15 ENCOUNTER — Ambulatory Visit (INDEPENDENT_AMBULATORY_CARE_PROVIDER_SITE_OTHER): Payer: Medicare Other | Admitting: Cardiovascular Disease

## 2015-12-15 ENCOUNTER — Encounter: Payer: Self-pay | Admitting: *Deleted

## 2015-12-15 VITALS — BP 106/70 | HR 76 | Ht 64.0 in | Wt 125.0 lb

## 2015-12-15 DIAGNOSIS — I340 Nonrheumatic mitral (valve) insufficiency: Secondary | ICD-10-CM

## 2015-12-15 DIAGNOSIS — I341 Nonrheumatic mitral (valve) prolapse: Secondary | ICD-10-CM

## 2015-12-15 DIAGNOSIS — Z01812 Encounter for preprocedural laboratory examination: Secondary | ICD-10-CM

## 2015-12-15 LAB — CBC
HEMATOCRIT: 35.5 % (ref 35.0–45.0)
HEMOGLOBIN: 11.7 g/dL (ref 11.7–15.5)
MCH: 32.5 pg (ref 27.0–33.0)
MCHC: 33 g/dL (ref 32.0–36.0)
MCV: 98.6 fL (ref 80.0–100.0)
MPV: 11.2 fL (ref 7.5–12.5)
Platelets: 273 10*3/uL (ref 140–400)
RBC: 3.6 MIL/uL — AB (ref 3.80–5.10)
RDW: 13.3 % (ref 11.0–15.0)
WBC: 5 10*3/uL (ref 3.8–10.8)

## 2015-12-15 LAB — PROTIME-INR
INR: 1.1
PROTHROMBIN TIME: 11.4 s (ref 9.0–11.5)

## 2015-12-15 LAB — BASIC METABOLIC PANEL
BUN: 15 mg/dL (ref 7–25)
CHLORIDE: 102 mmol/L (ref 98–110)
CO2: 27 mmol/L (ref 20–31)
Calcium: 9.9 mg/dL (ref 8.6–10.4)
Creat: 0.93 mg/dL (ref 0.60–0.93)
Glucose, Bld: 88 mg/dL (ref 65–99)
POTASSIUM: 4.1 mmol/L (ref 3.5–5.3)
SODIUM: 137 mmol/L (ref 135–146)

## 2015-12-15 NOTE — Patient Instructions (Signed)
Medication Instructions:  None  Labwork: BMET, INR and CBC today  Testing/Procedures: Your physician has requested that you have a cardiac catheterization. Cardiac catheterization is used to diagnose and/or treat various heart conditions. Doctors may recommend this procedure for a number of different reasons. The most common reason is to evaluate chest pain. Chest pain can be a symptom of coronary artery disease (CAD), and cardiac catheterization can show whether plaque is narrowing or blocking your heart's arteries. This procedure is also used to evaluate the valves, as well as measure the blood flow and oxygen levels in different parts of your heart. For further information please visit [email or web address]. Please follow instruction sheet, as given.   Follow-Up: Will be determined after your heart catheterization.  Any Other Special Instructions Will Be Listed Below (If Applicable).     If you need a refill on your cardiac medications before your next appointment, please call your pharmacy.

## 2015-12-15 NOTE — Progress Notes (Signed)
Cardiology Office Note   Date:  12/15/2015   ID:  Destiny Andersen, DOB 1945-09-03, MRN [id number]  PCP:  Marjorie Smolder, MD  Cardiologist:   Mertie Moores, MD   No chief complaint on file.  1. Severe mitral regurgitation 2.   History of Present Illness:  Destiny Andersen is a 70 yo with hx of MVP for years. She has been very healthy and has never had any cardiac symptoms. She works out regularly. She is working with a Clinical research associate as of this year.   October 08, 2013:  Destiny Andersen is doing well. She had an echo recently that revealed severe MR with prolapse of the posterior leaflet. She has slight dyspnea walking up a hill on her usual route but otherwise, she does not have any problems. Works out with a Clinical research associate once a week.   Oct. 20, 2015:  Destiny Andersen is doing well.  She is walking regularly - able to walk her usual route in the mornings - 2 - 3 miles a day. She does not have any episodes of chest pain or shortness of breath. She thinks she is able to walk better now than she was at the first of this year   October 05, 2014:  Destiny Andersen is a 70 y.o. female who presents for follow up of her mitral valve prolapse.   Oct. 26, 2016:  Destiny Andersen is doing well. Still exercising .   Had an episode of abdominal pain / chest pressure in May .  Has felt well since that time Walks 2-3 miles a day without DOE,  No CP   October 10, 2015: Back for eval. Echo this am shows severe MR with flail scallop  Walks her dog without any problems. Climbs her stairs without any problems  No CP , no dypsnea   December 15, 2015:  Destiny Andersen is seen back today for pre-cath visit . She has been seen by Dr. Roxy Manns and the plan is for cardiac catheterization next week in preparation for her mitral valve repair. Still has no CP. Some mild dyspnea ( which is new )  Some DOE walking up the hills.    Past Medical History  Diagnosis Date  . Risk for falls   . Fatigue   . Chronic headaches     "? Migraines"  . Routine  general medical examination at a health care facility   . Medicare annual wellness visit, subsequent   . Family history of breast cancer     Mother  . Elevated C-reactive protein (CRP)   . Cancer [facility]) 1983    Cervical cancer  . Hyperpotassemia     Migrated  . Heart murmur     ECHO 02/10/13 EF = 60-65%, moderate posterior mitral valve prolapse w/possible flail segment, severe MR, mitral regurgitant jet is anteriorly directed.  . MR (mitral regurgitation)     ECHO 02/10/13 EF = 60-65%, moderate posterior mitral valve prolapse w/possible flail segment, severe MR, mitral regurgitant jet is anteriorly directed.  . Mitral valve prolapse     Past Surgical History  Procedure Laterality Date  . Total abdominal hysterectomy  1983    Cervical cancer 1983  . Colonoscopy  08/2001    Colon cancer screening in 01/2012  . Breast biopsy  1998    benign cyst  . Laparoscopic cholecystectomy  1990  . Tee without cardioversion N/A 10/14/2015    Procedure: TRANSESOPHAGEAL ECHOCARDIOGRAM (TEE);  Surgeon: Thayer Headings, MD;  Location: [facility];  Service: Cardiovascular;  Laterality: N/A;     Current Outpatient Prescriptions  Medication Sig Dispense Refill  . aspirin 81 MG tablet Take 81 mg by mouth daily.    . calcium citrate-vitamin D (CITRACAL+D) 315-200 MG-UNIT tablet Take 1 tablet by mouth 3 (three) times daily. VITAMIN D DOSAGE IS 250MG  INSTEAD OF 200     No current facility-administered medications for this visit.    Allergies:   Penicillins; Codeine; Doxycycline hyclate; and Ultram    Social History:  The patient  reports that she quit smoking about 30 years ago. Her smoking use included Cigarettes. She quit after 25 years of use. She does not have any smokeless tobacco history on file. She reports that she drinks about 1.0 oz of alcohol per week. She reports that she does not use illicit drugs.   Family History:  The patient's family history includes AAA (abdominal aortic aneurysm) in  her father; CAD in her father and mother; Cancer in her brother and mother; Heart disease in her father and mother; High Cholesterol in her brother; Hypertension in her brother, father, and mother.    ROS:  Please see the history of present illness.    Review of Systems: Constitutional:  denies fever, chills, diaphoresis, appetite change and fatigue.  HEENT: denies photophobia, eye pain, redness, hearing loss, ear pain, congestion, sore throat, rhinorrhea, sneezing, neck pain, neck stiffness and tinnitus.  Respiratory: denies SOB, DOE, cough, chest tightness, and wheezing.  Cardiovascular: denies chest pain, palpitations and leg swelling.  Gastrointestinal: denies nausea, vomiting, abdominal pain, diarrhea, constipation, blood in stool.  Genitourinary: denies dysuria, urgency, frequency, hematuria, flank pain and difficulty urinating.  Musculoskeletal: denies  myalgias, back pain, joint swelling, arthralgias and gait problem.   Skin: denies pallor, rash and wound.  Neurological: denies dizziness, seizures, syncope, weakness, light-headedness, numbness and headaches.   Hematological: denies adenopathy, easy bruising, personal or family bleeding history.  Psychiatric/ Behavioral: denies suicidal ideation, mood changes, confusion, nervousness, sleep disturbance and agitation.       All other systems are reviewed and negative.    PHYSICAL EXAM: VS:  BP 106/70 mmHg  Pulse 76  Ht 5\' 4"  (1.626 m)  Wt 125 lb (56.7 kg)  BMI 21.45 kg/m2 , BMI Body mass index is 21.45 kg/(m^2). GEN: Well nourished, well developed, in no acute distress HEENT: normal Neck: no JVD, carotid bruits, or masses Cardiac: RRR; she has a late systolic 99991111  murmur. No  rubs, or gallops,no edema , the heart is not hyperdynamic.  Respiratory:  clear to auscultation bilaterally, normal work of breathing GI: soft, nontender, nondistended, + BS,  + pulsitile abdominal mass,  MS: no deformity or atrophy Skin: warm and  dry, no rash Neuro:  Strength and sensation are intact Psych: normal   EKG:  EKG is ordered today. NSR at 76.  Normal ECG   Recent Labs: 10/11/2015: BUN 14; Creat 0.93; Hemoglobin 12.1; Platelets 211; Potassium 4.4; Sodium 141    Lipid Panel No results found for: CHOL, TRIG, HDL, CHOLHDL, VLDL, LDLCALC, LDLDIRECT    Wt Readings from Last 3 Encounters:  12/15/15 125 lb (56.7 kg)  10/17/15 125 lb (56.7 kg)  10/14/15 125 lb (56.7 kg)      Other studies Reviewed: Additional studies/ records that were reviewed today include: . Review of the above records demonstrates:    ASSESSMENT AND PLAN:  1.  Mitral regurgitation:  Severe by   Echo. And TEE .  Had a long discussion. Has seen Dr. Roxy Manns. Is scheduled  for cath on July 5. precath labs today . To see Roxy Manns again July 17. Will likely set a surgical date at that time .   3. pulsitile abdominal aorta Has a hx of MVP.  Abdominal duplex shows no evidence of AAA Is scheduled for a CT angiogram this Friday   i will see her in  Several months   Current medicines are reviewed at length with the patient today.  The patient does not have concerns regarding medicines.  The following changes have been made:  no change  Labs/ tests ordered today include:  No orders of the defined types were placed in this encounter.    Disposition:   FU with me in 3  months    Signed, Mertie Moores, MD  12/15/2015 2:07 PM    [address], [place], [place]  [address] Phone: [phone]; Fax: [phone]

## 2015-12-21 ENCOUNTER — Encounter (HOSPITAL_COMMUNITY): Payer: Self-pay | Admitting: Cardiovascular Disease

## 2015-12-21 ENCOUNTER — Encounter (HOSPITAL_COMMUNITY)
Admission: RE | Disposition: A | Discharge status: Home / Self Care | Payer: Self-pay | Source: Ambulatory Visit | Attending: Cardiovascular Disease

## 2015-12-21 ENCOUNTER — Ambulatory Visit (HOSPITAL_COMMUNITY)
Admission: RE | Admit: 2015-12-21 | Discharge: 2015-12-21 | Disposition: A | Discharge status: Home / Self Care | Payer: Medicare Other | Source: Ambulatory Visit | Attending: Cardiovascular Disease | Admitting: Cardiovascular Disease

## 2015-12-21 DIAGNOSIS — I341 Nonrheumatic mitral (valve) prolapse: Secondary | ICD-10-CM | POA: Insufficient documentation

## 2015-12-21 DIAGNOSIS — Z8249 Family history of ischemic heart disease and other diseases of the circulatory system: Secondary | ICD-10-CM | POA: Insufficient documentation

## 2015-12-21 DIAGNOSIS — Z7982 Long term (current) use of aspirin: Secondary | ICD-10-CM | POA: Diagnosis not present

## 2015-12-21 DIAGNOSIS — Z880 Allergy status to penicillin: Secondary | ICD-10-CM | POA: Insufficient documentation

## 2015-12-21 DIAGNOSIS — I340 Nonrheumatic mitral (valve) insufficiency: Secondary | ICD-10-CM | POA: Diagnosis present

## 2015-12-21 DIAGNOSIS — R7982 Elevated C-reactive protein (CRP): Secondary | ICD-10-CM | POA: Insufficient documentation

## 2015-12-21 DIAGNOSIS — E875 Hyperkalemia: Secondary | ICD-10-CM | POA: Insufficient documentation

## 2015-12-21 DIAGNOSIS — G4489 Other headache syndrome: Secondary | ICD-10-CM | POA: Insufficient documentation

## 2015-12-21 DIAGNOSIS — Z87891 Personal history of nicotine dependence: Secondary | ICD-10-CM | POA: Insufficient documentation

## 2015-12-21 DIAGNOSIS — Z803 Family history of malignant neoplasm of breast: Secondary | ICD-10-CM | POA: Insufficient documentation

## 2015-12-21 DIAGNOSIS — Z8541 Personal history of malignant neoplasm of cervix uteri: Secondary | ICD-10-CM | POA: Insufficient documentation

## 2015-12-21 HISTORY — PX: CARDIAC CATHETERIZATION: SHX172

## 2015-12-21 LAB — POCT I-STAT 3, ART BLOOD GAS (G3+)
ACID-BASE EXCESS: 2 mmol/L (ref 0.0–2.0)
Bicarbonate: 27.3 mEq/L — ABNORMAL HIGH (ref 20.0–24.0)
O2 Saturation: 98 %
PH ART: 7.378 (ref 7.350–7.450)
TCO2: 29 mmol/L (ref 0–100)
pCO2 arterial: 46.4 mmHg — ABNORMAL HIGH (ref 35.0–45.0)
pO2, Arterial: 107 mmHg — ABNORMAL HIGH (ref 80.0–100.0)

## 2015-12-21 LAB — POCT I-STAT 3, VENOUS BLOOD GAS (G3P V)
Acid-Base Excess: 1 mmol/L (ref 0.0–2.0)
Bicarbonate: 26.4 mEq/L — ABNORMAL HIGH (ref 20.0–24.0)
Bicarbonate: 26.9 mEq/L — ABNORMAL HIGH (ref 20.0–24.0)
O2 SAT: 73 %
O2 Saturation: 68 %
PCO2 VEN: 47.9 mmHg (ref 45.0–50.0)
PH VEN: 7.338 — AB (ref 7.250–7.300)
PH VEN: 7.357 — AB (ref 7.250–7.300)
PO2 VEN: 41 mmHg (ref 31.0–45.0)
TCO2: 28 mmol/L (ref 0–100)
TCO2: 28 mmol/L (ref 0–100)
pCO2, Ven: 49.2 mmHg (ref 45.0–50.0)
pO2, Ven: 37 mmHg (ref 31.0–45.0)

## 2015-12-21 SURGERY — RIGHT/LEFT HEART CATH AND CORONARY ANGIOGRAPHY
Anesthesia: LOCAL

## 2015-12-21 MED ORDER — HEPARIN (PORCINE) IN NACL 2-0.9 UNIT/ML-% IJ SOLN
INTRAMUSCULAR | Status: AC
  Filled 2015-12-21: qty 1000

## 2015-12-21 MED ORDER — LIDOCAINE HCL (PF) 1 % IJ SOLN
INTRAMUSCULAR | Status: DC | PRN
  Administered 2015-12-21 (×2): 2 mL via INTRADERMAL

## 2015-12-21 MED ORDER — HEPARIN (PORCINE) IN NACL 2-0.9 UNIT/ML-% IJ SOLN
INTRAMUSCULAR | Status: DC | PRN
  Administered 2015-12-21: 1000 mL

## 2015-12-21 MED ORDER — ONDANSETRON HCL 4 MG/2ML IJ SOLN: 4.0000 mg | Freq: Four times a day (QID) | INTRAMUSCULAR | Status: DC | PRN

## 2015-12-21 MED ORDER — SODIUM CHLORIDE 0.9 % IV SOLN: INTRAVENOUS | Status: DC

## 2015-12-21 MED ORDER — VERAPAMIL HCL 2.5 MG/ML IV SOLN
INTRAVENOUS | Status: DC | PRN
  Administered 2015-12-21: 10 mL via INTRA_ARTERIAL

## 2015-12-21 MED ORDER — MIDAZOLAM HCL 2 MG/2ML IJ SOLN
INTRAMUSCULAR | Status: AC
  Filled 2015-12-21: qty 2

## 2015-12-21 MED ORDER — SODIUM CHLORIDE 0.9% FLUSH: 3.0000 mL | Freq: Two times a day (BID) | INTRAVENOUS | Status: DC

## 2015-12-21 MED ORDER — ASPIRIN 81 MG PO CHEW: 81.0000 mg | CHEWABLE_TABLET | ORAL | Status: DC

## 2015-12-21 MED ORDER — HEPARIN SODIUM (PORCINE) 1000 UNIT/ML IJ SOLN
INTRAMUSCULAR | Status: DC | PRN
  Administered 2015-12-21: 3000 [IU] via INTRAVENOUS

## 2015-12-21 MED ORDER — VERAPAMIL HCL 2.5 MG/ML IV SOLN
INTRAVENOUS | Status: AC
  Filled 2015-12-21: qty 2

## 2015-12-21 MED ORDER — SODIUM CHLORIDE 0.9 % IV SOLN: 250.0000 mL | INTRAVENOUS | Status: DC | PRN

## 2015-12-21 MED ORDER — HEPARIN SODIUM (PORCINE) 1000 UNIT/ML IJ SOLN
INTRAMUSCULAR | Status: AC
  Filled 2015-12-21: qty 1

## 2015-12-21 MED ORDER — SODIUM CHLORIDE 0.9% FLUSH: 3.0000 mL | INTRAVENOUS | Status: DC | PRN

## 2015-12-21 MED ORDER — FENTANYL CITRATE (PF) 100 MCG/2ML IJ SOLN
INTRAMUSCULAR | Status: DC | PRN
  Administered 2015-12-21: 50 ug via INTRAVENOUS

## 2015-12-21 MED ORDER — SODIUM CHLORIDE 0.9 % WEIGHT BASED INFUSION
1.0000 mL/kg/h | INTRAVENOUS | Status: DC
  Administered 2015-12-21: 1 mL/kg/h via INTRAVENOUS

## 2015-12-21 MED ORDER — LIDOCAINE HCL (PF) 1 % IJ SOLN
INTRAMUSCULAR | Status: AC
  Filled 2015-12-21: qty 30

## 2015-12-21 MED ORDER — IOPAMIDOL (ISOVUE-370) INJECTION 76%
INTRAVENOUS | Status: AC
  Filled 2015-12-21: qty 100

## 2015-12-21 MED ORDER — IOPAMIDOL (ISOVUE-370) INJECTION 76%
INTRAVENOUS | Status: DC | PRN
  Administered 2015-12-21: 50 mL via INTRA_ARTERIAL

## 2015-12-21 MED ORDER — ACETAMINOPHEN 325 MG PO TABS: 650.0000 mg | ORAL_TABLET | ORAL | Status: DC | PRN

## 2015-12-21 MED ORDER — FENTANYL CITRATE (PF) 100 MCG/2ML IJ SOLN
INTRAMUSCULAR | Status: AC
  Filled 2015-12-21: qty 2

## 2015-12-21 MED ORDER — SODIUM CHLORIDE 0.9 % WEIGHT BASED INFUSION
3.0000 mL/kg/h | INTRAVENOUS | Status: DC
  Administered 2015-12-21: 3 mL/kg/h via INTRAVENOUS

## 2015-12-21 MED ORDER — MIDAZOLAM HCL 2 MG/2ML IJ SOLN
INTRAMUSCULAR | Status: DC | PRN
  Administered 2015-12-21: 2 mg via INTRAVENOUS

## 2015-12-21 SURGICAL SUPPLY — 13 items
CATH BALLN WEDGE 5F 110CM (CATHETERS) ×1 IMPLANT
CATH INFINITI 5 FR JL3.5 (CATHETERS) ×1 IMPLANT
CATH INFINITI 5FR ANG PIGTAIL (CATHETERS) ×1 IMPLANT
CATH INFINITI JR4 5F (CATHETERS) ×1 IMPLANT
DEVICE RAD COMP TR BAND LRG (VASCULAR PRODUCTS) ×1 IMPLANT
GLIDESHEATH SLEND SS 6F .021 (SHEATH) ×1 IMPLANT
KIT HEART LEFT (KITS) ×2 IMPLANT
PACK CARDIAC CATHETERIZATION (CUSTOM PROCEDURE TRAY) ×2 IMPLANT
SHEATH FAST CATH BRACH 5F 5CM (SHEATH) ×1 IMPLANT
SYR MEDRAD MARK V 150ML (SYRINGE) ×2 IMPLANT
TRANSDUCER W/STOPCOCK (MISCELLANEOUS) ×3 IMPLANT
TUBING CIL FLEX 10 FLL-RA (TUBING) ×2 IMPLANT
WIRE SAFE-T 1.5MM-J .035X260CM (WIRE) ×1 IMPLANT

## 2015-12-21 NOTE — Discharge Instructions (Signed)
Radial Site Care °Refer to this sheet in the next few weeks. These instructions provide you with information about caring for yourself after your procedure. Your health care provider may also give you more specific instructions. Your treatment has been planned according to current medical practices, but problems sometimes occur. Call your health care provider if you have any problems or questions after your procedure. °WHAT TO EXPECT AFTER THE PROCEDURE °After your procedure, it is typical to have the following: °· Bruising at the radial site that usually fades within 1-2 weeks. °· Blood collecting in the tissue (hematoma) that may be painful to the touch. It should usually decrease in size and tenderness within 1-2 weeks. °HOME CARE INSTRUCTIONS °· Take medicines only as directed by your health care provider. °· You may shower 24-48 hours after the procedure or as directed by your health care provider. Remove the bandage (dressing) and gently wash the site with plain soap and water. Pat the area dry with a clean towel. Do not rub the site, because this may cause bleeding. °· Do not take baths, swim, or use a hot tub until your health care provider approves. °· Check your insertion site every day for redness, swelling, or drainage. °· Do not apply powder or lotion to the site. °· Do not flex or bend the affected arm for 24 hours or as directed by your health care provider. °· Do not push or pull heavy objects with the affected arm for 24 hours or as directed by your health care provider. °· Do not lift over 10 lb (4.5 kg) for 5 days after your procedure or as directed by your health care provider. °· Ask your health care provider when it is okay to: °¨ Return to work or school. °¨ Resume usual physical activities or sports. °¨ Resume sexual activity. °· Do not drive home if you are discharged the same day as the procedure. Have someone else drive you. °· You may drive 24 hours after the procedure unless otherwise  instructed by your health care provider. °· Do not operate machinery or power tools for 24 hours after the procedure. °· If your procedure was done as an outpatient procedure, which means that you went home the same day as your procedure, a responsible adult should be with you for the first 24 hours after you arrive home. °· Keep all follow-up visits as directed by your health care provider. This is important. °SEEK MEDICAL CARE IF: °· You have a fever. °· You have chills. °· You have increased bleeding from the radial site. Hold pressure on the site. °SEEK IMMEDIATE MEDICAL CARE IF: °· You have unusual pain at the radial site. °· You have redness, warmth, or swelling at the radial site. °· You have drainage (other than a small amount of blood on the dressing) from the radial site. °· The radial site is bleeding, and the bleeding does not stop after 30 minutes of holding steady pressure on the site. °· Your arm or hand becomes pale, cool, tingly, or numb. °  °This information is not intended to replace advice given to you by your health care provider. Make sure you discuss any questions you have with your health care provider. °  °Document Released: 07/07/2010 Document Revised: 06/25/2014 Document Reviewed: 12/21/2013 °Elsevier Interactive Patient Education ©2016 [employer] ° ° ° ° °Angiogram, Care After °Refer to this sheet in the next few weeks. These instructions provide you with information about caring for yourself after your procedure.   Your health care provider may also give you more specific instructions. Your treatment has been planned according to current medical practices, but problems sometimes occur. Call your health care provider if you have any problems or questions after your procedure. °WHAT TO EXPECT AFTER THE PROCEDURE °After your procedure, it is typical to have the following: °· Bruising at the catheter insertion site that usually fades within 1-2 weeks. °· Blood collecting in the tissue  (hematoma) that may be painful to the touch. It should usually decrease in size and tenderness within 1-2 weeks. °HOME CARE INSTRUCTIONS °· Take medicines only as directed by your health care provider. °· You may shower 24-48 hours after the procedure or as directed by your health care provider. Remove the bandage (dressing) and gently wash the site with plain soap and water. Pat the area dry with a clean towel. Do not rub the site, because this may cause bleeding. °· Do not take baths, swim, or use a hot tub until your health care provider approves. °· Check your insertion site every day for redness, swelling, or drainage. °· Do not apply powder or lotion to the site. °· Do not lift over 10 lb (4.5 kg) for 5 days after your procedure or as directed by your health care provider. °· Ask your health care provider when it is okay to: °¨ Return to work or school. °¨ Resume usual physical activities or sports. °¨ Resume sexual activity. °· Do not drive home if you are discharged the same day as the procedure. Have someone else drive you. °· You may drive 24 hours after the procedure unless otherwise instructed by your health care provider. °· Do not operate machinery or power tools for 24 hours after the procedure or as directed by your health care provider. °· If your procedure was done as an outpatient procedure, which means that you went home the same day as your procedure, a responsible adult should be with you for the first 24 hours after you arrive home. °· Keep all follow-up visits as directed by your health care provider. This is important. °SEEK MEDICAL CARE IF: °· You have a fever. °· You have chills. °· You have increased bleeding from the catheter insertion site. Hold pressure on the site. °SEEK IMMEDIATE MEDICAL CARE IF: °· You have unusual pain at the catheter insertion site. °· You have redness, warmth, or swelling at the catheter insertion site. °· You have drainage (other than a small amount of blood on  the dressing) from the catheter insertion site. °· The catheter insertion site is bleeding, and the bleeding does not stop after 30 minutes of holding steady pressure on the site. °· The area near or just beyond the catheter insertion site becomes pale, cool, tingly, or numb. °  °This information is not intended to replace advice given to you by your health care provider. Make sure you discuss any questions you have with your health care provider. °  °Document Released: 12/21/2004 Document Revised: 06/25/2014 Document Reviewed: 11/05/2012 °Elsevier Interactive Patient Education ©2016 [employer] Inc[employer] ° ° °

## 2015-12-21 NOTE — Interval H&P Note (Signed)
History and Physical Interval Note:  12/21/2015 8:14 AM  Destiny Andersen  has presented today for surgery, with the diagnosis of surgical clearance, mitrial regurgetation  The various methods of treatment have been discussed with the patient and family. After consideration of risks, benefits and other options for treatment, the patient has consented to  Procedure(s): Right/Left Heart Cath and Coronary Angiography (N/A) as a surgical intervention .  The patient's history has been reviewed, patient examined, no change in status, stable for surgery.  I have reviewed the patient's chart and labs.  Questions were answered to the patient's satisfaction.     Sherren Mocha

## 2015-12-21 NOTE — H&P (View-Only) (Signed)
Cardiology Office Note   Date:  12/15/2015   ID:  Destiny Andersen, DOB Dec 01, 1945, MRN [id number]  PCP:  Marjorie Smolder, MD  Cardiologist:   Mertie Moores, MD   No chief complaint on file.  1. Severe mitral regurgitation 2.   History of Present Illness:  Destiny Andersen is a 70 yo with hx of MVP for years. She has been very healthy and has never had any cardiac symptoms. She works out regularly. She is working with a Clinical research associate as of this year.   October 08, 2013:  Destiny Andersen is doing well. She had an echo recently that revealed severe MR with prolapse of the posterior leaflet. She has slight dyspnea walking up a hill on her usual route but otherwise, she does not have any problems. Works out with a Clinical research associate once a week.   Oct. 20, 2015:  Destiny Andersen is doing well.  She is walking regularly - able to walk her usual route in the mornings - 2 - 3 miles a day. She does not have any episodes of chest pain or shortness of breath. She thinks she is able to walk better now than she was at the first of this year   October 05, 2014:  Destiny Andersen is a 70 y.o. female who presents for follow up of her mitral valve prolapse.   Oct. 26, 2016:  Destiny Andersen is doing well. Still exercising .   Had an episode of abdominal pain / chest pressure in May .  Has felt well since that time Walks 2-3 miles a day without DOE,  No CP   October 10, 2015: Back for eval. Echo this am shows severe MR with flail scallop  Walks her dog without any problems. Climbs her stairs without any problems  No CP , no dypsnea   December 15, 2015:  Destiny Andersen is seen back today for pre-cath visit . She has been seen by Dr. Roxy Manns and the plan is for cardiac catheterization next week in preparation for her mitral valve repair. Still has no CP. Some mild dyspnea ( which is new )  Some DOE walking up the hills.    Past Medical History  Diagnosis Date  . Risk for falls   . Fatigue   . Chronic headaches     "? Migraines"  . Routine  general medical examination at a health care facility   . Medicare annual wellness visit, subsequent   . Family history of breast cancer     Mother  . Elevated C-reactive protein (CRP)   . Cancer [facility]) 1983    Cervical cancer  . Hyperpotassemia     Migrated  . Heart murmur     ECHO 02/10/13 EF = 60-65%, moderate posterior mitral valve prolapse w/possible flail segment, severe MR, mitral regurgitant jet is anteriorly directed.  . MR (mitral regurgitation)     ECHO 02/10/13 EF = 60-65%, moderate posterior mitral valve prolapse w/possible flail segment, severe MR, mitral regurgitant jet is anteriorly directed.  . Mitral valve prolapse     Past Surgical History  Procedure Laterality Date  . Total abdominal hysterectomy  1983    Cervical cancer 1983  . Colonoscopy  08/2001    Colon cancer screening in 01/2012  . Breast biopsy  1998    benign cyst  . Laparoscopic cholecystectomy  1990  . Tee without cardioversion N/A 10/14/2015    Procedure: TRANSESOPHAGEAL ECHOCARDIOGRAM (TEE);  Surgeon: Thayer Headings, MD;  Location: [facility];  Service: Cardiovascular;  Laterality: N/A;     Current Outpatient Prescriptions  Medication Sig Dispense Refill  . aspirin 81 MG tablet Take 81 mg by mouth daily.    . calcium citrate-vitamin D (CITRACAL+D) 315-200 MG-UNIT tablet Take 1 tablet by mouth 3 (three) times daily. VITAMIN D DOSAGE IS 250MG  INSTEAD OF 200     No current facility-administered medications for this visit.    Allergies:   Penicillins; Codeine; Doxycycline hyclate; and Ultram    Social History:  The patient  reports that she quit smoking about 30 years ago. Her smoking use included Cigarettes. She quit after 25 years of use. She does not have any smokeless tobacco history on file. She reports that she drinks about 1.0 oz of alcohol per week. She reports that she does not use illicit drugs.   Family History:  The patient's family history includes AAA (abdominal aortic aneurysm) in  her father; CAD in her father and mother; Cancer in her brother and mother; Heart disease in her father and mother; High Cholesterol in her brother; Hypertension in her brother, father, and mother.    ROS:  Please see the history of present illness.    Review of Systems: Constitutional:  denies fever, chills, diaphoresis, appetite change and fatigue.  HEENT: denies photophobia, eye pain, redness, hearing loss, ear pain, congestion, sore throat, rhinorrhea, sneezing, neck pain, neck stiffness and tinnitus.  Respiratory: denies SOB, DOE, cough, chest tightness, and wheezing.  Cardiovascular: denies chest pain, palpitations and leg swelling.  Gastrointestinal: denies nausea, vomiting, abdominal pain, diarrhea, constipation, blood in stool.  Genitourinary: denies dysuria, urgency, frequency, hematuria, flank pain and difficulty urinating.  Musculoskeletal: denies  myalgias, back pain, joint swelling, arthralgias and gait problem.   Skin: denies pallor, rash and wound.  Neurological: denies dizziness, seizures, syncope, weakness, light-headedness, numbness and headaches.   Hematological: denies adenopathy, easy bruising, personal or family bleeding history.  Psychiatric/ Behavioral: denies suicidal ideation, mood changes, confusion, nervousness, sleep disturbance and agitation.       All other systems are reviewed and negative.    PHYSICAL EXAM: VS:  BP 106/70 mmHg  Pulse 76  Ht 5\' 4"  (1.626 m)  Wt 125 lb (56.7 kg)  BMI 21.45 kg/m2 , BMI Body mass index is 21.45 kg/(m^2). GEN: Well nourished, well developed, in no acute distress HEENT: normal Neck: no JVD, carotid bruits, or masses Cardiac: RRR; she has a late systolic 99991111  murmur. No  rubs, or gallops,no edema , the heart is not hyperdynamic.  Respiratory:  clear to auscultation bilaterally, normal work of breathing GI: soft, nontender, nondistended, + BS,  + pulsitile abdominal mass,  MS: no deformity or atrophy Skin: warm and  dry, no rash Neuro:  Strength and sensation are intact Psych: normal   EKG:  EKG is ordered today. NSR at 76.  Normal ECG   Recent Labs: 10/11/2015: BUN 14; Creat 0.93; Hemoglobin 12.1; Platelets 211; Potassium 4.4; Sodium 141    Lipid Panel No results found for: CHOL, TRIG, HDL, CHOLHDL, VLDL, LDLCALC, LDLDIRECT    Wt Readings from Last 3 Encounters:  12/15/15 125 lb (56.7 kg)  10/17/15 125 lb (56.7 kg)  10/14/15 125 lb (56.7 kg)      Other studies Reviewed: Additional studies/ records that were reviewed today include: . Review of the above records demonstrates:    ASSESSMENT AND PLAN:  1.  Mitral regurgitation:  Severe by   Echo. And TEE .  Had a long discussion. Has seen Dr. Roxy Manns. Is scheduled  for cath on July 5. precath labs today . To see Roxy Manns again July 17. Will likely set a surgical date at that time .   3. pulsitile abdominal aorta Has a hx of MVP.  Abdominal duplex shows no evidence of AAA Is scheduled for a CT angiogram this Friday   i will see her in  Several months   Current medicines are reviewed at length with the patient today.  The patient does not have concerns regarding medicines.  The following changes have been made:  no change  Labs/ tests ordered today include:  No orders of the defined types were placed in this encounter.    Disposition:   FU with me in 3  months    Signed, Mertie Moores, MD  12/15/2015 2:07 PM    [address], [place], [place]  [address] Phone: [phone]; Fax: [phone]

## 2015-12-23 ENCOUNTER — Ambulatory Visit
Admission: RE | Admit: 2015-12-23 | Discharge: 2015-12-23 | Disposition: A | Discharge status: Home / Self Care | Payer: Medicare Other | Source: Ambulatory Visit | Attending: Thoracic Surgery (Cardiothoracic Vascular Surgery) | Admitting: Thoracic Surgery (Cardiothoracic Vascular Surgery)

## 2015-12-23 DIAGNOSIS — I340 Nonrheumatic mitral (valve) insufficiency: Secondary | ICD-10-CM

## 2015-12-23 MED ORDER — IOPAMIDOL (ISOVUE-370) INJECTION 76%
75.0000 mL | Freq: Once | INTRAVENOUS | Status: AC | PRN
  Administered 2015-12-23: 75 mL via INTRAVENOUS

## 2016-01-02 ENCOUNTER — Ambulatory Visit (INDEPENDENT_AMBULATORY_CARE_PROVIDER_SITE_OTHER): Payer: Medicare Other | Admitting: Thoracic Surgery (Cardiothoracic Vascular Surgery)

## 2016-01-02 ENCOUNTER — Other Ambulatory Visit: Payer: Self-pay | Admitting: *Deleted

## 2016-01-02 ENCOUNTER — Encounter: Payer: Self-pay | Admitting: Thoracic Surgery (Cardiothoracic Vascular Surgery)

## 2016-01-02 VITALS — BP 142/70 | HR 80 | Resp 16 | Ht 64.0 in | Wt 125.0 lb

## 2016-01-02 DIAGNOSIS — I341 Nonrheumatic mitral (valve) prolapse: Secondary | ICD-10-CM

## 2016-01-02 DIAGNOSIS — I340 Nonrheumatic mitral (valve) insufficiency: Secondary | ICD-10-CM | POA: Diagnosis not present

## 2016-01-02 MED ORDER — AMIODARONE HCL 200 MG PO TABS: 200.0000 mg | ORAL_TABLET | Freq: Every day | ORAL | Status: DC

## 2016-01-02 NOTE — Patient Instructions (Signed)
Begin taking amiodarone 7 days prior to surgery

## 2016-01-02 NOTE — Progress Notes (Signed)
[address]       [place] [address]             [phone]     CARDIOTHORACIC SURGERY OFFICE NOTE  Referring Provider is [name], Wonda Cheng, MD PCP is Marjorie Smolder, MD   HPI:  Patient returns to the office today for follow-up of severe symptomatic primary mitral regurgitation. She was originally seen in consultation on 10/17/2015. Since then she underwent diagnostic cardiac catheterization and CT angiography. She returns to the office today for review the results of these tests and potentially make plans for elective mitral valve repair in the near future. She states that she has remained stable over the past 2 months. However, she has begun to experience a change in her exercise tolerance. She states that she gets winded and fatigue with more strenuous exertion. This has not affected her day-to-day activities to any significant degree. She otherwise feels quite well. The remainder of her review of systems is unchanged from previously.   Current Outpatient Prescriptions  Medication Sig Dispense Refill  . aspirin 81 MG tablet Take 81 mg by mouth daily.    . calcium citrate-vitamin D (CITRACAL+D) 315-200 MG-UNIT tablet Take 1 tablet by mouth 3 (three) times daily. VITAMIN D DOSAGE IS 250MG  INSTEAD OF 200     No current facility-administered medications for this visit.      Physical Exam:   BP 142/70 mmHg  Pulse 80  Resp 16  Ht 5\' 4"  (1.626 m)  Wt 125 lb (56.7 kg)  BMI 21.45 kg/m2  SpO2 98%  General:  Well appearing  Chest:   Clear to auscultation with symmetrical breath sounds  CV:   Regular rate and rhythm with prominent holosystolic murmur  Incisions:  n/a  Abdomen:  Soft and nontender  Extremities:  Warm and well perfused, no edema  Diagnostic Tests:  CARDIAC CATHETERIZATION Procedures    Right/Left Heart Cath and Coronary Angiography    Conclusion     There is hyperdynamic left ventricular systolic function.  1. Patent coronary  arteries, right-dominant, with no obstructive CAD 2. Vigorous LV systolic function without regional wall motion abnormalities 3. Severe mitral regurgitation by angiographic and hemodynamic assessment 4. Normal right-sided heart pressures     Indications    Severe mitral regurgitation [I34.0 (ICD-10-CM)]    Technique and Indications    INDICATION: Severe mitral regurgitation, preoperative study for mitral valve repair  PROCEDURAL DETAILS: There was an indwelling IV in a right antecubital vein. Using normal sterile technique, the IV was changed out for a 5 Fr brachial sheath over a 0.018 inch wire. The right wrist was then prepped, draped, and anesthetized with 1% lidocaine. Using the modified Seldinger technique a 5/6 French Slender sheath was placed in the right radial artery. Intra-arterial verapamil was administered through the radial artery sheath. IV heparin was administered after a JR4 catheter was advanced into the central aorta. A Swan-Ganz catheter was used for the right heart catheterization. Standard protocol was followed for recording of right heart pressures and sampling of oxygen saturations. Fick cardiac output was calculated. Standard Judkins catheters were used for selective coronary angiography and left ventriculography. There were no immediate procedural complications. The patient was transferred to the post catheterization recovery area for further monitoring.  During this procedure the patient is administered a total of Versed 2 mg and Fentanyl 50 mg to achieve and maintain moderate conscious sedation. The patient's heart rate, blood pressure, and oxygen saturation are  monitored continuously during the procedure. The period of conscious sedation is 39 minutes, of which I was present face-to-face 100% of this time.  Estimated blood loss <50 mL. There were no immediate complications during the procedure.    Coronary Findings    Dominance: Right   Left Anterior  Descending  . Vessel is angiographically normal.     Left Circumflex  . Vessel is angiographically normal.     Right Coronary Artery  . Vessel is angiographically normal.       Right Heart Pressures LV EDP is normal. Normal right heart pressures, except for large V waves in the PCWP tracing, consistent with the patient's known severe mitral regurgitation.    Wall Motion                 Left Heart    Left Ventricle The left ventricular size is normal. There is hyperdynamic left ventricular systolic function. The left ventricular ejection fraction is greater tha 65% by visual estimate. There are no wall motion abnormalities in the left ventricle.   Mitral Valve There is severe (4+) mitral regurgitation.    Coronary Diagrams    Diagnostic Diagram            Implants     No implant documentation for this case.    PACS Images    Show images for Cardiac catheterization     Link to Procedure Log    Procedure Log      Hemo Data       Most Recent Value   Fick Cardiac Output  5.42 L/min   Fick Cardiac Output Index  3.35 (L/min)/BSA   RA A Wave  5 mmHg   RA V Wave  3 mmHg   RA Mean  1 mmHg   RV Systolic Pressure  25 mmHg   RV Diastolic Pressure  0 mmHg   RV EDP  4 mmHg   PA Systolic Pressure  24 mmHg   PA Diastolic Pressure  8 mmHg   PA Mean  18 mmHg   PW A Wave  16 mmHg   PW V Wave  24 mmHg   PW Mean  13 mmHg   AO Systolic Pressure  [id number] mmHg   AO Diastolic Pressure  59 mmHg   AO Mean  79 mmHg   LV Systolic Pressure  123456 mmHg   LV Diastolic Pressure  0 mmHg   LV EDP  15 mmHg   Arterial Occlusion Pressure Extended Systolic Pressure  123456 mmHg   Arterial Occlusion Pressure Extended Diastolic Pressure  61 mmHg   Arterial Occlusion Pressure Extended Mean Pressure  86 mmHg   Left Ventricular Apex Extended Systolic Pressure  [id number] mmHg   Left Ventricular Apex Extended Diastolic Pressure  0 mmHg   Left Ventricular Apex  Extended EDP Pressure  15 mmHg   QP/QS  0.83   TPVR Index  6.46 HRUI   TSVR Index  23.63 HRUI   PVR SVR Ratio  0.08   TPVR/TSVR Ratio  0.27    CT ANGIOGRAPHY CHEST, ABDOMEN AND PELVIS  TECHNIQUE: Multidetector CT imaging through the chest, abdomen and pelvis was performed using the standard protocol during bolus administration of intravenous contrast. Multiplanar reconstructed images and MIPs were obtained and reviewed to evaluate the vascular anatomy.  CONTRAST: 75 cc Isovue 370 IV  COMPARISON: None.  FINDINGS: CTA CHEST FINDINGS  Cardiovascular: Heart is normal size. Aorta is normal caliber. No evidence of dissection. Scattered calcifications in the aortic arch.  No visible coronary artery calcifications.  Mediastinum/Nodes: No mediastinal, hilar, or axillary adenopathy.  Lungs/Pleura: Lungs are clear. No focal airspace opacities or suspicious nodules. No effusions.  Musculoskeletal: No acute bony abnormality.  Review of the MIP images confirms the above findings.  CTA ABDOMEN AND PELVIS FINDINGS  Hepatobiliary: Prior cholecystectomy. No focal hepatic abnormality or biliary ductal dilatation.  Pancreas: No focal abnormality or ductal dilatation.  Spleen: No focal abnormality. Normal size.  Adrenals/Urinary Tract: No adrenal abnormality. No focal renal abnormality. No stones or hydronephrosis. Urinary bladder is unremarkable.  Stomach/Bowel: Stomach, large and small bowel grossly unremarkable.  Vascular/Lymphatic: Aorta is normal caliber. No dissection. Scattered calcifications in the distal aorta and proximal right common iliac artery. Iliofemoral vessels are widely patent. No adenopathy.  Reproductive: No visible focal abnormality. Mild prominence of the prostate.  Other: No free fluid or free air.  Musculoskeletal: No acute bony abnormality or focal bone lesion.  Review of the MIP images confirms the above  findings.  IMPRESSION: No evidence of aortic aneurysm or dissection.  No acute findings in the chest, abdomen or pelvis.  Scattered aortic atherosclerosis in the aortic arch and distal abdominal aorta.   Electronically Signed  By: Rolm Baptise M.D.  On: 12/23/2015 09:34     Impression:  Patient has stage D severe symptomatic primary mitral regurgitation. She has recently developed symptoms of exertional shortness of breath that only occur with more strenuous physical exertion. She is quite active physically, but she has definitely noticed a change in her exercise tolerance. I have previously reviewed the patient's transthoracic and transesophageal echocardiograms. She has mitral valve prolapse with an obvious flail segment of the middle scallop of the posterior leaflet. 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  There is an eccentric jet of mitral regurgitation that courses anteriorly around the left atrium with severe mitral regurgitation. There are no other complicating features. I feel that there is a very high likelihood that her valve should be repairable with low operative risks.  Diagnostic cardiac catheterization is notable for the absence of significant coronary artery disease. CT angiography reveals adequate vascular access to facilitate peripheral cannulation for surgery.   Plan:  The patient and her family were again counseled at length regarding the indications, risks and potential benefits of mitral valve repair.  The patient's husband is present for her office visit today and the patient's daughter listened in and communicated via telephone.  The rationale for elective surgery has been explained, including a comparison between surgery and continued medical therapy with close follow-up.  The likelihood of successful and durable valve repair has been discussed with particular reference to the findings of their recent echocardiogram.  Based upon these findings and  previous experience, I have quoted them a greater than 95 percent likelihood of successful valve repair.   The patient understands and accepts all potential risks of surgery including but not limited to risk of death, stroke or other neurologic complication, myocardial infarction, congestive heart failure, respiratory failure, renal failure, bleeding requiring transfusion and/or reexploration, arrhythmia, infection or other wound complications, pneumonia, pleural and/or pericardial effusion, pulmonary embolus, aortic dissection or other major vascular complication, or delayed complications related to valve repair or replacement including but not limited to structural valve deterioration and failure, thrombosis, embolization, endocarditis, or paravalvular leak.  Alternative surgical approaches have been discussed including a comparison between conventional sternotomy and minimally-invasive techniques.  The relative risks and benefits of each have been reviewed as they pertain to the patient's specific circumstances, and  all of their questions have been addressed.  Specific risks potentially related to the minimally-invasive approach were discussed at length, including but not limited to risk of conversion to full or partial sternotomy, aortic dissection or other major vascular complication, unilateral acute lung injury or pulmonary edema, phrenic nerve dysfunction or paralysis, rib fracture, chronic pain, lung hernia, or lymphocele.  All of their questions have been answered.    We plan to proceed with surgery on Wednesday, 02/01/2016. The patient has been given a prescription for amiodarone to begin 1 week prior to surgery to decrease her risk of perioperative atrial dysrhythmias.  The patient will return for follow-up on Monday, 01/30/2016 prior to surgery.  I spent in excess of 30 minutes during the conduct of this office consultation and >50% of this time involved direct face-to-face encounter with the  patient for counseling and/or coordination of their care.   Valentina Gu. Roxy Manns, MD 01/02/2016 3:22 PM

## 2016-01-30 ENCOUNTER — Ambulatory Visit (HOSPITAL_COMMUNITY)
Admission: RE | Admit: 2016-01-30 | Discharge: 2016-01-30 | Disposition: A | Discharge status: Home / Self Care | Payer: Medicare Other | Source: Ambulatory Visit | Attending: Thoracic Surgery (Cardiothoracic Vascular Surgery) | Admitting: Thoracic Surgery (Cardiothoracic Vascular Surgery)

## 2016-01-30 ENCOUNTER — Ambulatory Visit (HOSPITAL_BASED_OUTPATIENT_CLINIC_OR_DEPARTMENT_OTHER)
Admission: RE | Admit: 2016-01-30 | Discharge: 2016-01-30 | Disposition: A | Discharge status: Home / Self Care | Payer: Medicare Other | Source: Ambulatory Visit | Attending: Thoracic Surgery (Cardiothoracic Vascular Surgery) | Admitting: Thoracic Surgery (Cardiothoracic Vascular Surgery)

## 2016-01-30 ENCOUNTER — Ambulatory Visit (INDEPENDENT_AMBULATORY_CARE_PROVIDER_SITE_OTHER): Payer: Medicare Other | Admitting: Thoracic Surgery (Cardiothoracic Vascular Surgery)

## 2016-01-30 ENCOUNTER — Encounter (HOSPITAL_COMMUNITY): Payer: Self-pay

## 2016-01-30 ENCOUNTER — Encounter (HOSPITAL_COMMUNITY)
Admission: RE | Admit: 2016-01-30 | Discharge: 2016-01-30 | Disposition: A | Discharge status: Home / Self Care | Payer: Medicare Other | Source: Ambulatory Visit | Attending: Thoracic Surgery (Cardiothoracic Vascular Surgery) | Admitting: Thoracic Surgery (Cardiothoracic Vascular Surgery)

## 2016-01-30 ENCOUNTER — Encounter: Payer: Self-pay | Admitting: Thoracic Surgery (Cardiothoracic Vascular Surgery)

## 2016-01-30 VITALS — BP 120/72 | HR 80 | Resp 20 | Ht 64.0 in | Wt 126.0 lb

## 2016-01-30 DIAGNOSIS — I340 Nonrheumatic mitral (valve) insufficiency: Secondary | ICD-10-CM | POA: Diagnosis not present

## 2016-01-30 DIAGNOSIS — Z01818 Encounter for other preprocedural examination: Secondary | ICD-10-CM

## 2016-01-30 DIAGNOSIS — I498 Other specified cardiac arrhythmias: Secondary | ICD-10-CM | POA: Insufficient documentation

## 2016-01-30 DIAGNOSIS — Z0183 Encounter for blood typing: Secondary | ICD-10-CM | POA: Insufficient documentation

## 2016-01-30 DIAGNOSIS — Z01812 Encounter for preprocedural laboratory examination: Secondary | ICD-10-CM

## 2016-01-30 DIAGNOSIS — I341 Nonrheumatic mitral (valve) prolapse: Secondary | ICD-10-CM | POA: Diagnosis not present

## 2016-01-30 HISTORY — DX: Unspecified convulsions: R56.9

## 2016-01-30 HISTORY — DX: Reserved for inherently not codable concepts without codable children: IMO0001

## 2016-01-30 HISTORY — DX: Personal history of other medical treatment: Z92.89

## 2016-01-30 HISTORY — DX: Constipation, unspecified: K59.00

## 2016-01-30 LAB — VAS US DOPPLER PRE CABG
LCCADSYS: -77 cm/s
LCCAPDIAS: 22 cm/s
LCCAPSYS: 95 cm/s
LEFT ECA DIAS: -11 cm/s
LEFT VERTEBRAL DIAS: 13 cm/s
LICADDIAS: -30 cm/s
LICAPDIAS: 14 cm/s
LICAPSYS: 68 cm/s
Left CCA dist dias: -23 cm/s
Left ICA dist sys: -99 cm/s
RCCAPSYS: 113 cm/s
RIGHT ECA DIAS: -6 cm/s
RIGHT VERTEBRAL DIAS: -20 cm/s
Right CCA prox dias: 20 cm/s
Right cca dist sys: -109 cm/s

## 2016-01-30 LAB — COMPREHENSIVE METABOLIC PANEL
ALBUMIN: 4.3 g/dL (ref 3.5–5.0)
ALK PHOS: 42 U/L (ref 38–126)
ALT: 17 U/L (ref 14–54)
AST: 26 U/L (ref 15–41)
Anion gap: 6 (ref 5–15)
BILIRUBIN TOTAL: 0.4 mg/dL (ref 0.3–1.2)
BUN: 14 mg/dL (ref 6–20)
CALCIUM: 9.6 mg/dL (ref 8.9–10.3)
CO2: 22 mmol/L (ref 22–32)
CREATININE: 1.01 mg/dL — AB (ref 0.44–1.00)
Chloride: 108 mmol/L (ref 101–111)
GFR calc Af Amer: >60 mL/min (ref >60)
GFR, EST NON AFRICAN AMERICAN: 55 mL/min — AB (ref >60)
GLUCOSE: 97 mg/dL (ref 65–99)
Potassium: 4.1 mmol/L (ref 3.5–5.1)
Sodium: 136 mmol/L (ref 135–145)
TOTAL PROTEIN: 7.9 g/dL (ref 6.5–8.1)

## 2016-01-30 LAB — PULMONARY FUNCTION TEST
DL/VA % pred: 80 %
DL/VA: 3.76 ml/min/mmHg/L
DLCO UNC % PRED: 68 %
DLCO UNC: 15.74 ml/min/mmHg
FEF 25-75 PRE: 1.31 L/s
FEF 25-75 Post: 1.89 L/sec
FEF2575-%Change-Post: 44 %
FEF2575-%PRED-PRE: 73 %
FEF2575-%Pred-Post: 105 %
FEV1-%Change-Post: 9 %
FEV1-%PRED-POST: 97 %
FEV1-%Pred-Pre: 88 %
FEV1-POST: 2.08 L
FEV1-Pre: 1.89 L
FEV1FVC-%CHANGE-POST: 12 %
FEV1FVC-%Pred-Pre: 93 %
FEV6-%CHANGE-POST: -4 %
FEV6-%PRED-POST: 93 %
FEV6-%PRED-PRE: 97 %
FEV6-POST: 2.53 L
FEV6-Pre: 2.64 L
FEV6FVC-%CHANGE-POST: 1 %
FEV6FVC-%PRED-POST: 105 %
FEV6FVC-%PRED-PRE: 104 %
FVC-%Change-Post: -2 %
FVC-%Pred-Post: 91 %
FVC-%Pred-Pre: 94 %
FVC-Post: 2.61 L
FVC-Pre: 2.67 L
POST FEV6/FVC RATIO: 100 %
PRE FEV1/FVC RATIO: 71 %
Post FEV1/FVC ratio: 80 %
Pre FEV6/FVC Ratio: 99 %
RV % PRED: 96 %
RV: 2.09 L
TLC % PRED: 98 %
TLC: 4.82 L

## 2016-01-30 LAB — SURGICAL PCR SCREEN
MRSA, PCR: NEGATIVE
STAPHYLOCOCCUS AUREUS: NEGATIVE

## 2016-01-30 LAB — URINE MICROSCOPIC-ADD ON: RBC / HPF: NONE SEEN RBC/hpf (ref 0–5)

## 2016-01-30 LAB — APTT: aPTT: 28 seconds (ref 24–36)

## 2016-01-30 LAB — CBC
HEMATOCRIT: 37.4 % (ref 36.0–46.0)
HEMOGLOBIN: 12.1 g/dL (ref 12.0–15.0)
MCH: 32.6 pg (ref 26.0–34.0)
MCHC: 32.4 g/dL (ref 30.0–36.0)
MCV: 100.8 fL — AB (ref 78.0–100.0)
Platelets: 200 10*3/uL (ref 150–400)
RBC: 3.71 MIL/uL — ABNORMAL LOW (ref 3.87–5.11)
RDW: 13.5 % (ref 11.5–15.5)
WBC: 5.3 10*3/uL (ref 4.0–10.5)

## 2016-01-30 LAB — URINALYSIS, ROUTINE W REFLEX MICROSCOPIC
BILIRUBIN URINE: NEGATIVE
Glucose, UA: NEGATIVE mg/dL
Hgb urine dipstick: NEGATIVE
Ketones, ur: NEGATIVE mg/dL
NITRITE: NEGATIVE
PH: 5.5 (ref 5.0–8.0)
Protein, ur: NEGATIVE mg/dL
SPECIFIC GRAVITY, URINE: 1.013 (ref 1.005–1.030)

## 2016-01-30 LAB — BLOOD GAS, ARTERIAL
Acid-Base Excess: 0.4 mmol/L (ref 0.0–2.0)
BICARBONATE: 24.2 meq/L — AB (ref 20.0–24.0)
DRAWN BY: 206361
FIO2: 21
O2 Saturation: 98.2 %
PATIENT TEMPERATURE: 98.6
PCO2 ART: 36.8 mmHg (ref 35.0–45.0)
PH ART: 7.433 (ref 7.350–7.450)
TCO2: 25.3 mmol/L (ref 0–100)
pO2, Arterial: 109 mmHg — ABNORMAL HIGH (ref 80.0–100.0)

## 2016-01-30 LAB — PROTIME-INR
INR: 1.05
PROTHROMBIN TIME: 13.7 s (ref 11.4–15.2)

## 2016-01-30 LAB — ABO/RH: ABO/RH(D): A POS

## 2016-01-30 MED ORDER — ALBUTEROL SULFATE (2.5 MG/3ML) 0.083% IN NEBU
2.5000 mg | INHALATION_SOLUTION | Freq: Once | RESPIRATORY_TRACT | Status: AC
  Administered 2016-01-30: 2.5 mg via RESPIRATORY_TRACT

## 2016-01-30 NOTE — Progress Notes (Signed)
[address]       [place] [address]             [phone]     CARDIOTHORACIC SURGERY OFFICE NOTE  Referring Provider is [name], Wonda Cheng, MD PCP is Marjorie Smolder, MD   HPI:  Patient returns to the office today for follow-up of severe symptomatic primary mitral regurgitation with tentative plans to proceed with minimally invasive mitral valve repair later this week. She was originally seen in consultation on 10/17/2015 and she was seen more recently on 01/02/2016. Since then she has remained clinically stable. She describes stable symptoms of mild exertional shortness of breath. Symptoms have not progressed. She denies any recent fevers, chills, or productive cough. Appetite is stable. She is anxious to get her surgery over with.   Current Outpatient Prescriptions  Medication Sig Dispense Refill  . amiodarone (PACERONE) 200 MG tablet Take 1 tablet (200 mg total) by mouth daily. Begin 7 days prior to surgery 30 tablet 0  . aspirin 81 MG tablet Take 81 mg by mouth daily.    . calcium citrate-vitamin D (CITRACAL+D) 315-200 MG-UNIT tablet Take 1 tablet by mouth 3 (three) times daily. VITAMIN D DOSAGE IS 250MG  INSTEAD OF 200     No current facility-administered medications for this visit.       Physical Exam:   BP 120/72 (BP Location: Right Leg, Patient Position: Sitting, Cuff Size: Small)   Pulse 80   Resp 20   Ht 5\' 4"  (1.626 m)   Wt 126 lb (57.2 kg)   BMI 21.63 kg/m   General:  Well-appearing  Chest:   Clear to auscultation  CV:   Regular rate and rhythm with grade 4/6 holosystolic murmur  Incisions:  n/a  Abdomen:  Soft and nontender  Extremities:  Warm and well-perfused  Diagnostic Tests:  CHEST  2 VIEW  COMPARISON:  CT 12/23/2015  FINDINGS: Mediastinum hilar structures normal. Lungs are clear. Heart size normal. No pleural effusion or pneumothorax. No acute bony abnormality.  IMPRESSION: No acute or focal  abnormality.   Electronically Signed   By: Marcello Moores  Register   On: 01/30/2016 13:41   Impression:  Patient has stage D severe symptomatic primary mitral regurgitation. She has recently developed symptoms of exertional shortness of breath that only occur with more strenuous physical exertion. She is quite active physically, but she has definitely noticed a change in her exercise tolerance. I have previously reviewed the patient's transthoracic and transesophageal echocardiograms. She has mitral valve prolapse with an obvious flail segment of the middle scallop of the posterior leaflet. 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  There is an eccentric jet of mitral regurgitation that courses anteriorly around the left atrium with severe mitral regurgitation. There are no other complicating features. I feel that there is a very high likelihood that her valve should be repairable with low operative risks.  Diagnostic cardiac catheterization is notable for the absence of significant coronary artery disease. CT angiography reveals adequate vascular access to facilitate peripheral cannulation for surgery.   Plan:  The patient and her family were again counseled at length regarding the indications, risks and potential benefits of mitral valve repair.  The rationale for elective surgery has been explained, including a comparison between surgery and continued medical therapy with close follow-up.  The likelihood of successful and durable valve repair has been discussed with particular reference to the findings of their recent echocardiogram.  Based upon these findings and  previous experience, I have quoted them a greater than 95 percent likelihood of successful valve repair.  In the unlikely event that their valve cannot be successfully repaired, we discussed the possibility of replacing the mitral valve using a mechanical prosthesis with the attendant need for long-term anticoagulation versus the  alternative of replacing it using a bioprosthetic tissue valve with its potential for late structural valve deterioration and failure, depending upon the patient's longevity.  The patient specifically requests that if the mitral valve must be replaced that it be done using a bioprosthetic tissue valve.   The patient understands and accepts all potential risks of surgery including but not limited to risk of death, stroke or other neurologic complication, myocardial infarction, congestive heart failure, respiratory failure, renal failure, bleeding requiring transfusion and/or reexploration, arrhythmia, infection or other wound complications, pneumonia, pleural and/or pericardial effusion, pulmonary embolus, aortic dissection or other major vascular complication, or delayed complications related to valve repair or replacement including but not limited to structural valve deterioration and failure, thrombosis, embolization, endocarditis, or paravalvular leak.  Alternative surgical approaches have been discussed including a comparison between conventional sternotomy and minimally-invasive techniques.  The relative risks and benefits of each have been reviewed as they pertain to the patient's specific circumstances, and all of their questions have been addressed.  Specific risks potentially related to the minimally-invasive approach were discussed at length, including but not limited to risk of conversion to full or partial sternotomy, aortic dissection or other major vascular complication, unilateral acute lung injury or pulmonary edema, phrenic nerve dysfunction or paralysis, rib fracture, chronic pain, lung hernia, or lymphocele.  Expectations for the patient's postoperative recovery has been discussed. All of their questions have been answered.    I spent in excess of 15 minutes during the conduct of this office consultation and >50% of this time involved direct face-to-face encounter with the patient for  counseling and/or coordination of their care.   Valentina Gu. Roxy Manns, MD 01/30/2016 3:50 PM

## 2016-01-30 NOTE — Patient Instructions (Signed)
   Patient should continue taking all current medications without change through the day before surgery.  Patient should have nothing to eat or drink after midnight the night before surgery.  On the morning of surgery patient does not need any medications

## 2016-01-30 NOTE — Progress Notes (Addendum)
Pre-op Cardiac Surgery  Carotid Findings: CArotid Duplex evaluation-No evidence of stenosis in bilateral ICA's.  Upper Extremity Right Left  Brachial Pressures 122 Triphasic 121 Triphasic  Radial Waveforms Triphasic Triphasic  Ulnar Waveforms Triphasic Triphasic  Palmar Arch (Allen's Test) Normal Normal   Findings:    Palmer Arch evaluation-Doppler waveforms remain within normal limits with compression in the right and left radial artery and ulnar artery bilaterally. at rest.  Janifer Adie, RVT, RDMS

## 2016-01-30 NOTE — Pre-Procedure Instructions (Signed)
    Destiny Andersen [id number]  01/30/2016    Your procedure is scheduled on Wednesday, August 16  Report to [facility] Admitting at 6:30 A.M.               Your surgery or procedure is scheduled for 8:30 AM   Call this number if you have problems the morning of surgery:[phone]                 For any other questions, please call [phone], Monday - Friday 8 AM - 4 PM.   Remember:  Do not eat food or drink liquids after midnight Tuesday, August 15.  Take these medicines the morning of surgery with A SIP OF WATER: Amiodarone (Pacerone).                Stop taking Aspirin, Vitamins Herbal medications.  Do not take any NSAIDs ie: Ibuprofen,  Advil,Naproxen or any medication containing Aspirin.   Do not wear jewelry, make-up or nail polish.  Do not wear lotions, powders, or perfumes.   Do not shave 48 hours prior to surgery  Do not bring valuables to the hospital.  [facility] is not responsible for any belongings or valuables.  Contacts, dentures or bridgework may not be worn into surgery.  Leave your suitcase in the car.  After surgery it may be brought to your room.  For patients admitted to the hospital, discharge time will be determined by your treatment team.  Special instructions: Bring Incentive Spirometry to the hospital with you.  Please read over the following fact sheets that you were given: [facility]- Preparing For Surgery and Patient Instructions for Mupirocin Application, Incentive Spirometry

## 2016-01-31 LAB — HEMOGLOBIN A1C
HEMOGLOBIN A1C: 5.5 % (ref 4.8–5.6)
MEAN PLASMA GLUCOSE: 111 mg/dL

## 2016-01-31 MED ORDER — SODIUM CHLORIDE 0.9 % IV SOLN
INTRAVENOUS | Status: AC
  Administered 2016-02-01: 70 mL/h via INTRAVENOUS
  Filled 2016-01-31: qty 40

## 2016-01-31 MED ORDER — POTASSIUM CHLORIDE 2 MEQ/ML IV SOLN
80.0000 meq | INTRAVENOUS | Status: DC
  Filled 2016-01-31: qty 40

## 2016-01-31 MED ORDER — SODIUM CHLORIDE 0.9 % IV SOLN
INTRAVENOUS | Status: AC
  Administered 2016-02-01: .9 [IU]/h via INTRAVENOUS
  Filled 2016-01-31: qty 2.5

## 2016-01-31 MED ORDER — VANCOMYCIN HCL 1000 MG IV SOLR
INTRAVENOUS | Status: AC
  Administered 2016-02-01: 1000 mL
  Filled 2016-01-31: qty 1000

## 2016-01-31 MED ORDER — VANCOMYCIN HCL 10 G IV SOLR
1250.0000 mg | INTRAVENOUS | Status: AC
  Administered 2016-02-01: 1250 mg via INTRAVENOUS
  Filled 2016-01-31: qty 1250

## 2016-01-31 MED ORDER — DEXTROSE 5 % IV SOLN
0.0000 ug/min | INTRAVENOUS | Status: DC
  Filled 2016-01-31: qty 4

## 2016-01-31 MED ORDER — MAGNESIUM SULFATE 50 % IJ SOLN
40.0000 meq | INTRAMUSCULAR | Status: DC
  Filled 2016-01-31: qty 10

## 2016-01-31 MED ORDER — NITROGLYCERIN IN D5W 200-5 MCG/ML-% IV SOLN
2.0000 ug/min | INTRAVENOUS | Status: AC
  Administered 2016-02-01: 10 ug/min via INTRAVENOUS
  Filled 2016-01-31: qty 250

## 2016-01-31 MED ORDER — CHLORHEXIDINE GLUCONATE 0.12 % MT SOLN
15.0000 mL | Freq: Once | OROMUCOSAL | Status: AC
  Administered 2016-02-01: 15 mL via OROMUCOSAL
  Filled 2016-01-31: qty 15

## 2016-01-31 MED ORDER — DOPAMINE-DEXTROSE 3.2-5 MG/ML-% IV SOLN
0.0000 ug/kg/min | INTRAVENOUS | Status: DC
  Filled 2016-01-31: qty 250

## 2016-01-31 MED ORDER — PAPAVERINE HCL 30 MG/ML IJ SOLN
INTRAMUSCULAR | Status: DC
  Filled 2016-01-31: qty 2.5

## 2016-01-31 MED ORDER — GLUTARALDEHYDE 0.625% SOAKING SOLUTION
TOPICAL | Status: DC | PRN
  Filled 2016-01-31: qty 50

## 2016-01-31 MED ORDER — SODIUM CHLORIDE 0.9 % IV SOLN
INTRAVENOUS | Status: DC
  Filled 2016-01-31: qty 30

## 2016-01-31 MED ORDER — LEVOFLOXACIN IN D5W 500 MG/100ML IV SOLN
500.0000 mg | INTRAVENOUS | Status: AC
  Administered 2016-02-01: 500 mg via INTRAVENOUS
  Filled 2016-01-31: qty 100

## 2016-01-31 MED ORDER — DEXMEDETOMIDINE HCL IN NACL 400 MCG/100ML IV SOLN
0.1000 ug/kg/h | INTRAVENOUS | Status: AC
  Administered 2016-02-01: .4 ug/kg/h via INTRAVENOUS
  Filled 2016-01-31: qty 100

## 2016-01-31 MED ORDER — PHENYLEPHRINE HCL 10 MG/ML IJ SOLN
30.0000 ug/min | INTRAMUSCULAR | Status: AC
  Administered 2016-02-01: 10 ug/min via INTRAVENOUS
  Filled 2016-01-31: qty 2

## 2016-01-31 MED ORDER — METOPROLOL TARTRATE 12.5 MG HALF TABLET
12.5000 mg | ORAL_TABLET | Freq: Once | ORAL | Status: AC
  Administered 2016-02-01: 12.5 mg via ORAL
  Filled 2016-01-31: qty 1

## 2016-01-31 NOTE — Anesthesia Preprocedure Evaluation (Addendum)
Anesthesia Evaluation  Patient identified by MRN, date of birth, ID band Patient awake    Reviewed: Allergy & Precautions, NPO status , Patient's Chart, lab work & pertinent test results  History of Anesthesia Complications Negative for: history of anesthetic complications  Airway Mallampati: I  TM Distance: >3 FB Neck ROM: Full    Dental  (+) Teeth Intact, Caps, Dental Advisory Given   Pulmonary shortness of breath and with exertion, former smoker (quit 1987),    breath sounds clear to auscultation       Cardiovascular (-) angina+ Valvular Problems/Murmurs (severe MR) MR  Rhythm:Regular Rate:Normal  7/17 cath: Patent coronary arteries, right-dominant, with no obstructive CAD, Vigorous LV systolic function without regional wall motion abnormalities, Severe mitral regurgitation, Normal right-sided heart pressures  4/17 TEE: Normal LVF, Mitral valve:  There was an eccentric jet of MR that appeared to wrap around the left atrium. There was no evidence of flow reversal in the pulmonary veins. Severe prolapse, involving the posterior leaflet.  Doppler: no evidence for stenosis, there is moderate to severe regurgitation.     Neuro/Psych  Headaches,    GI/Hepatic negative GI ROS, Neg liver ROS,   Endo/Other  negative endocrine ROS  Renal/GU negative Renal ROS     Musculoskeletal H/o TMJ   Abdominal   Peds  Hematology negative hematology ROS (+)   Anesthesia Other Findings   Reproductive/Obstetrics                            Anesthesia Physical Anesthesia Plan  ASA: III  Anesthesia Plan: General   Post-op Pain Management:    Induction: Intravenous  Airway Management Planned: Double Lumen EBT  Additional Equipment: Arterial line, PA Cath, 3D TEE and Ultrasound Guidance Line Placement  Intra-op Plan:   Post-operative Plan: Post-operative intubation/ventilation  Informed Consent: I have  reviewed the patients History and Physical, chart, labs and discussed the procedure including the risks, benefits and alternatives for the proposed anesthesia with the patient or authorized representative who has indicated his/her understanding and acceptance.   Dental advisory given  Plan Discussed with: CRNA and Surgeon  Anesthesia Plan Comments: (Plan routine monitors,  A-line, PA cath, GETA with TEE and post op ventilation)        Anesthesia Quick Evaluation

## 2016-02-01 ENCOUNTER — Inpatient Hospital Stay (HOSPITAL_COMMUNITY): Payer: Medicare Other

## 2016-02-01 ENCOUNTER — Inpatient Hospital Stay (HOSPITAL_COMMUNITY): Payer: Medicare Other | Admitting: Anesthesiology

## 2016-02-01 ENCOUNTER — Encounter (HOSPITAL_COMMUNITY)
Admission: RE | Disposition: A | Discharge status: Home / Self Care | Payer: Self-pay | Source: Ambulatory Visit | Attending: Thoracic Surgery (Cardiothoracic Vascular Surgery)

## 2016-02-01 ENCOUNTER — Encounter (HOSPITAL_COMMUNITY): Payer: Self-pay | Admitting: Anesthesiology

## 2016-02-01 ENCOUNTER — Inpatient Hospital Stay (HOSPITAL_COMMUNITY)
Admission: RE | Admit: 2016-02-01 | Discharge: 2016-02-07 | DRG: 219 | Disposition: A | Discharge status: Home / Self Care | Payer: Medicare Other | Source: Ambulatory Visit | Attending: Thoracic Surgery (Cardiothoracic Vascular Surgery) | Admitting: Thoracic Surgery (Cardiothoracic Vascular Surgery)

## 2016-02-01 DIAGNOSIS — Z87891 Personal history of nicotine dependence: Secondary | ICD-10-CM | POA: Diagnosis not present

## 2016-02-01 DIAGNOSIS — I341 Nonrheumatic mitral (valve) prolapse: Secondary | ICD-10-CM | POA: Diagnosis present

## 2016-02-01 DIAGNOSIS — J939 Pneumothorax, unspecified: Secondary | ICD-10-CM

## 2016-02-01 DIAGNOSIS — Z8541 Personal history of malignant neoplasm of cervix uteri: Secondary | ICD-10-CM | POA: Diagnosis not present

## 2016-02-01 DIAGNOSIS — R001 Bradycardia, unspecified: Secondary | ICD-10-CM | POA: Diagnosis not present

## 2016-02-01 DIAGNOSIS — J9811 Atelectasis: Secondary | ICD-10-CM | POA: Diagnosis not present

## 2016-02-01 DIAGNOSIS — I340 Nonrheumatic mitral (valve) insufficiency: Principal | ICD-10-CM | POA: Diagnosis present

## 2016-02-01 DIAGNOSIS — I511 Rupture of chordae tendineae, not elsewhere classified: Secondary | ICD-10-CM | POA: Diagnosis present

## 2016-02-01 DIAGNOSIS — Q211 Atrial septal defect: Secondary | ICD-10-CM | POA: Diagnosis not present

## 2016-02-01 DIAGNOSIS — Z9889 Other specified postprocedural states: Secondary | ICD-10-CM

## 2016-02-01 DIAGNOSIS — D62 Acute posthemorrhagic anemia: Secondary | ICD-10-CM | POA: Diagnosis not present

## 2016-02-01 DIAGNOSIS — E8770 Fluid overload, unspecified: Secondary | ICD-10-CM | POA: Diagnosis not present

## 2016-02-01 DIAGNOSIS — Z7982 Long term (current) use of aspirin: Secondary | ICD-10-CM | POA: Diagnosis not present

## 2016-02-01 DIAGNOSIS — D696 Thrombocytopenia, unspecified: Secondary | ICD-10-CM | POA: Diagnosis present

## 2016-02-01 DIAGNOSIS — Z9689 Presence of other specified functional implants: Secondary | ICD-10-CM

## 2016-02-01 HISTORY — PX: TEE WITHOUT CARDIOVERSION: SHX5443

## 2016-02-01 HISTORY — DX: Other specified postprocedural states: Z98.890

## 2016-02-01 HISTORY — PX: MITRAL VALVE REPAIR: SHX2039

## 2016-02-01 LAB — POCT I-STAT, CHEM 8
BUN: 14 mg/dL (ref 6–20)
BUN: 10 mg/dL (ref 6–20)
BUN: 12 mg/dL (ref 6–20)
BUN: 11 mg/dL (ref 6–20)
BUN: 10 mg/dL (ref 6–20)
BUN: 10 mg/dL (ref 6–20)
BUN: 13 mg/dL (ref 6–20)
CALCIUM ION: 0.91 mmol/L — AB (ref 1.12–1.23)
CALCIUM ION: 0.95 mmol/L — AB (ref 1.12–1.23)
CALCIUM ION: 0.93 mmol/L — AB (ref 1.12–1.23)
CALCIUM ION: 0.91 mmol/L — AB (ref 1.12–1.23)
CHLORIDE: 104 mmol/L (ref 101–111)
CHLORIDE: 102 mmol/L (ref 101–111)
CHLORIDE: 105 mmol/L (ref 101–111)
CREATININE: 0.5 mg/dL (ref 0.44–1.00)
CREATININE: 0.7 mg/dL (ref 0.44–1.00)
CREATININE: 0.7 mg/dL (ref 0.44–1.00)
CREATININE: 0.6 mg/dL (ref 0.44–1.00)
CREATININE: 0.7 mg/dL (ref 0.44–1.00)
Calcium, Ion: 1.26 mmol/L — ABNORMAL HIGH (ref 1.12–1.23)
Calcium, Ion: 0.95 mmol/L — ABNORMAL LOW (ref 1.12–1.23)
Calcium, Ion: 0.99 mmol/L — ABNORMAL LOW (ref 1.12–1.23)
Chloride: 100 mmol/L — ABNORMAL LOW (ref 101–111)
Chloride: 103 mmol/L (ref 101–111)
Chloride: 107 mmol/L (ref 101–111)
Chloride: 109 mmol/L (ref 101–111)
Creatinine, Ser: 0.7 mg/dL (ref 0.44–1.00)
Creatinine, Ser: 0.6 mg/dL (ref 0.44–1.00)
GLUCOSE: 85 mg/dL (ref 65–99)
GLUCOSE: 124 mg/dL — AB (ref 65–99)
GLUCOSE: 136 mg/dL — AB (ref 65–99)
GLUCOSE: 141 mg/dL — AB (ref 65–99)
GLUCOSE: 150 mg/dL — AB (ref 65–99)
Glucose, Bld: 106 mg/dL — ABNORMAL HIGH (ref 65–99)
Glucose, Bld: 134 mg/dL — ABNORMAL HIGH (ref 65–99)
HCT: 21 % — ABNORMAL LOW (ref 36.0–46.0)
HCT: 22 % — ABNORMAL LOW (ref 36.0–46.0)
HCT: 23 % — ABNORMAL LOW (ref 36.0–46.0)
HCT: 17 % — ABNORMAL LOW (ref 36.0–46.0)
HCT: 30 % — ABNORMAL LOW (ref 36.0–46.0)
HEMATOCRIT: 33 % — AB (ref 36.0–46.0)
HEMATOCRIT: 31 % — AB (ref 36.0–46.0)
HEMOGLOBIN: 7.5 g/dL — AB (ref 12.0–15.0)
HEMOGLOBIN: 7.8 g/dL — AB (ref 12.0–15.0)
HEMOGLOBIN: 10.5 g/dL — AB (ref 12.0–15.0)
HEMOGLOBIN: 10.2 g/dL — AB (ref 12.0–15.0)
Hemoglobin: 11.2 g/dL — ABNORMAL LOW (ref 12.0–15.0)
Hemoglobin: 7.1 g/dL — ABNORMAL LOW (ref 12.0–15.0)
Hemoglobin: 5.8 g/dL — CL (ref 12.0–15.0)
POTASSIUM: 3.7 mmol/L (ref 3.5–5.1)
POTASSIUM: 3.7 mmol/L (ref 3.5–5.1)
POTASSIUM: 4.6 mmol/L (ref 3.5–5.1)
Potassium: 4.5 mmol/L (ref 3.5–5.1)
Potassium: 3.8 mmol/L (ref 3.5–5.1)
Potassium: 3.5 mmol/L (ref 3.5–5.1)
Potassium: 3.7 mmol/L (ref 3.5–5.1)
SODIUM: 143 mmol/L (ref 135–145)
SODIUM: 143 mmol/L (ref 135–145)
SODIUM: 144 mmol/L (ref 135–145)
Sodium: 143 mmol/L (ref 135–145)
Sodium: 136 mmol/L (ref 135–145)
Sodium: 147 mmol/L — ABNORMAL HIGH (ref 135–145)
Sodium: 143 mmol/L (ref 135–145)
TCO2: 29 mmol/L (ref 0–100)
TCO2: 26 mmol/L (ref 0–100)
TCO2: 26 mmol/L (ref 0–100)
TCO2: 35 mmol/L (ref 0–100)
TCO2: 22 mmol/L (ref 0–100)
TCO2: 23 mmol/L (ref 0–100)
TCO2: 25 mmol/L (ref 0–100)

## 2016-02-01 LAB — POCT I-STAT 3, ART BLOOD GAS (G3+)
Acid-Base Excess: 2 mmol/L (ref 0.0–2.0)
Acid-base deficit: 6 mmol/L — ABNORMAL HIGH (ref 0.0–2.0)
BICARBONATE: 25.6 meq/L — AB (ref 20.0–24.0)
BICARBONATE: 20.4 meq/L (ref 20.0–24.0)
O2 Saturation: 100 %
O2 Saturation: 94 %
PCO2 ART: 36.2 mmHg (ref 35.0–45.0)
PH ART: 7.457 — AB (ref 7.350–7.450)
PH ART: 7.293 — AB (ref 7.350–7.450)
PO2 ART: 358 mmHg — AB (ref 80.0–100.0)
TCO2: 27 mmol/L (ref 0–100)
TCO2: 22 mmol/L (ref 0–100)
pCO2 arterial: 42.1 mmHg (ref 35.0–45.0)
pO2, Arterial: 81 mmHg (ref 80.0–100.0)

## 2016-02-01 LAB — POCT I-STAT 4, (NA,K, GLUC, HGB,HCT)
Glucose, Bld: 108 mg/dL — ABNORMAL HIGH (ref 65–99)
HCT: 29 % — ABNORMAL LOW (ref 36.0–46.0)
HEMOGLOBIN: 9.9 g/dL — AB (ref 12.0–15.0)
POTASSIUM: 3.6 mmol/L (ref 3.5–5.1)
Sodium: 146 mmol/L — ABNORMAL HIGH (ref 135–145)

## 2016-02-01 LAB — GLUCOSE, CAPILLARY
GLUCOSE-CAPILLARY: 128 mg/dL — AB (ref 65–99)
GLUCOSE-CAPILLARY: 131 mg/dL — AB (ref 65–99)
GLUCOSE-CAPILLARY: 133 mg/dL — AB (ref 65–99)
GLUCOSE-CAPILLARY: 138 mg/dL — AB (ref 65–99)
GLUCOSE-CAPILLARY: 149 mg/dL — AB (ref 65–99)
Glucose-Capillary: 150 mg/dL — ABNORMAL HIGH (ref 65–99)

## 2016-02-01 LAB — PROTIME-INR
INR: 2.39
PROTHROMBIN TIME: 26.5 s — AB (ref 11.4–15.2)

## 2016-02-01 LAB — PREPARE RBC (CROSSMATCH)

## 2016-02-01 LAB — CBC
HCT: 32.7 % — ABNORMAL LOW (ref 36.0–46.0)
HEMATOCRIT: 30.6 % — AB (ref 36.0–46.0)
HEMOGLOBIN: 10.7 g/dL — AB (ref 12.0–15.0)
HEMOGLOBIN: 10.1 g/dL — AB (ref 12.0–15.0)
MCH: 30.9 pg (ref 26.0–34.0)
MCH: 31.2 pg (ref 26.0–34.0)
MCHC: 32.7 g/dL (ref 30.0–36.0)
MCHC: 33 g/dL (ref 30.0–36.0)
MCV: 94.5 fL (ref 78.0–100.0)
MCV: 94.4 fL (ref 78.0–100.0)
Platelets: 56 10*3/uL — ABNORMAL LOW (ref 150–400)
Platelets: 141 10*3/uL — ABNORMAL LOW (ref 150–400)
RBC: 3.46 MIL/uL — ABNORMAL LOW (ref 3.87–5.11)
RBC: 3.24 MIL/uL — ABNORMAL LOW (ref 3.87–5.11)
RDW: 16.1 % — ABNORMAL HIGH (ref 11.5–15.5)
RDW: 17 % — ABNORMAL HIGH (ref 11.5–15.5)
WBC: 6.4 10*3/uL (ref 4.0–10.5)
WBC: 7.3 10*3/uL (ref 4.0–10.5)

## 2016-02-01 LAB — APTT: aPTT: 53 seconds — ABNORMAL HIGH (ref 24–36)

## 2016-02-01 LAB — HEMOGLOBIN AND HEMATOCRIT, BLOOD
HCT: 24.5 % — ABNORMAL LOW (ref 36.0–46.0)
HEMOGLOBIN: 8.1 g/dL — AB (ref 12.0–15.0)

## 2016-02-01 LAB — CREATININE, SERUM: CREATININE: 0.86 mg/dL (ref 0.44–1.00)

## 2016-02-01 LAB — MAGNESIUM: Magnesium: 3.2 mg/dL — ABNORMAL HIGH (ref 1.7–2.4)

## 2016-02-01 LAB — PLATELET COUNT: Platelets: 66 10*3/uL — ABNORMAL LOW (ref 150–400)

## 2016-02-01 SURGERY — REPAIR, MITRAL VALVE, MINIMALLY INVASIVE
Anesthesia: General | Site: Chest | Laterality: Right

## 2016-02-01 MED ORDER — SODIUM CHLORIDE 0.45 % IV SOLN
INTRAVENOUS | Status: DC | PRN
  Administered 2016-02-01: 17:00:00 via INTRAVENOUS

## 2016-02-01 MED ORDER — VECURONIUM BROMIDE 10 MG IV SOLR
INTRAVENOUS | Status: AC
  Filled 2016-02-01: qty 20

## 2016-02-01 MED ORDER — CHLORHEXIDINE GLUCONATE 0.12 % MT SOLN
15.0000 mL | OROMUCOSAL | Status: AC
  Administered 2016-02-01: 15 mL via OROMUCOSAL

## 2016-02-01 MED ORDER — MORPHINE SULFATE (PF) 2 MG/ML IV SOLN: 2.0000 mg | INTRAVENOUS | Status: DC | PRN

## 2016-02-01 MED ORDER — MAGNESIUM SULFATE 4 GM/100ML IV SOLN
4.0000 g | Freq: Once | INTRAVENOUS | Status: AC
  Administered 2016-02-01: 4 g via INTRAVENOUS
  Filled 2016-02-01: qty 100

## 2016-02-01 MED ORDER — LACTATED RINGERS IV SOLN
INTRAVENOUS | Status: DC | PRN
  Administered 2016-02-01: 08:00:00 via INTRAVENOUS

## 2016-02-01 MED ORDER — SODIUM CHLORIDE 0.9 % IJ SOLN
INTRAMUSCULAR | Status: AC
  Filled 2016-02-01: qty 10

## 2016-02-01 MED ORDER — ANTISEPTIC ORAL RINSE SOLUTION (CORINZ)
7.0000 mL | OROMUCOSAL | Status: DC
  Administered 2016-02-01 – 2016-02-02 (×5): 7 mL via OROMUCOSAL

## 2016-02-01 MED ORDER — FENTANYL CITRATE (PF) 100 MCG/2ML IJ SOLN
INTRAMUSCULAR | Status: AC
  Filled 2016-02-01: qty 10

## 2016-02-01 MED ORDER — ARTIFICIAL TEARS OP OINT
TOPICAL_OINTMENT | OPHTHALMIC | Status: DC | PRN
  Administered 2016-02-01: 1 via OPHTHALMIC

## 2016-02-01 MED ORDER — FENTANYL CITRATE (PF) 100 MCG/2ML IJ SOLN
INTRAMUSCULAR | Status: DC | PRN
  Administered 2016-02-01: 500 ug via INTRAVENOUS
  Administered 2016-02-01: 250 ug via INTRAVENOUS
  Administered 2016-02-01: 450 ug via INTRAVENOUS
  Administered 2016-02-01: 250 ug via INTRAVENOUS
  Administered 2016-02-01: 50 ug via INTRAVENOUS

## 2016-02-01 MED ORDER — ASPIRIN EC 325 MG PO TBEC
325.0000 mg | DELAYED_RELEASE_TABLET | Freq: Every day | ORAL | Status: DC
  Administered 2016-02-02 – 2016-02-03 (×2): 325 mg via ORAL
  Filled 2016-02-01 (×2): qty 1

## 2016-02-01 MED ORDER — CHLORHEXIDINE GLUCONATE 4 % EX LIQD: 30.0000 mL | CUTANEOUS | Status: DC

## 2016-02-01 MED ORDER — ACETAMINOPHEN 500 MG PO TABS
1000.0000 mg | ORAL_TABLET | Freq: Four times a day (QID) | ORAL | Status: DC
  Administered 2016-02-02 – 2016-02-03 (×5): 1000 mg via ORAL
  Filled 2016-02-01 (×5): qty 2

## 2016-02-01 MED ORDER — ACETAMINOPHEN 160 MG/5ML PO SOLN: 650.0000 mg | Freq: Once | ORAL | Status: AC

## 2016-02-01 MED ORDER — TRAMADOL HCL 50 MG PO TABS
50.0000 mg | ORAL_TABLET | ORAL | Status: DC | PRN
Start: 2016-02-01 — End: 2016-02-07

## 2016-02-01 MED ORDER — POTASSIUM CHLORIDE 10 MEQ/50ML IV SOLN
10.0000 meq | INTRAVENOUS | Status: AC
  Administered 2016-02-01 (×3): 10 meq via INTRAVENOUS

## 2016-02-01 MED ORDER — DOCUSATE SODIUM 100 MG PO CAPS
200.0000 mg | ORAL_CAPSULE | Freq: Every day | ORAL | Status: DC
  Administered 2016-02-02 – 2016-02-05 (×3): 200 mg via ORAL
  Filled 2016-02-01 (×3): qty 2

## 2016-02-01 MED ORDER — DOPAMINE-DEXTROSE 3.2-5 MG/ML-% IV SOLN
0.0000 ug/kg/min | INTRAVENOUS | Status: DC
  Administered 2016-02-01: 3 ug/kg/min via INTRAVENOUS

## 2016-02-01 MED ORDER — SODIUM CHLORIDE 0.9 % IV SOLN: 250.0000 mL | INTRAVENOUS | Status: DC

## 2016-02-01 MED ORDER — ALBUMIN HUMAN 5 % IV SOLN
INTRAVENOUS | Status: DC | PRN
  Administered 2016-02-01 (×2): via INTRAVENOUS

## 2016-02-01 MED ORDER — LACTATED RINGERS IV SOLN: INTRAVENOUS | Status: DC

## 2016-02-01 MED ORDER — VANCOMYCIN HCL IN DEXTROSE 1-5 GM/200ML-% IV SOLN
1000.0000 mg | Freq: Once | INTRAVENOUS | Status: AC
  Administered 2016-02-01: 1000 mg via INTRAVENOUS
  Filled 2016-02-01: qty 200

## 2016-02-01 MED ORDER — MIDAZOLAM HCL 2 MG/2ML IJ SOLN
2.0000 mg | INTRAMUSCULAR | Status: DC | PRN
Start: 2016-02-01 — End: 2016-02-02

## 2016-02-01 MED ORDER — CHLORHEXIDINE GLUCONATE 0.12% ORAL RINSE (MEDLINE KIT)
15.0000 mL | Freq: Two times a day (BID) | OROMUCOSAL | Status: DC
  Administered 2016-02-01 – 2016-02-02 (×2): 15 mL via OROMUCOSAL

## 2016-02-01 MED ORDER — ROCURONIUM BROMIDE 100 MG/10ML IV SOLN
INTRAVENOUS | Status: DC | PRN
  Administered 2016-02-01: 30 mg via INTRAVENOUS
  Administered 2016-02-01: 50 mg via INTRAVENOUS
  Administered 2016-02-01: 20 mg via INTRAVENOUS

## 2016-02-01 MED ORDER — SODIUM CHLORIDE 0.9% FLUSH
3.0000 mL | Freq: Two times a day (BID) | INTRAVENOUS | Status: DC
  Administered 2016-02-02 – 2016-02-06 (×7): 3 mL via INTRAVENOUS

## 2016-02-01 MED ORDER — NITROGLYCERIN IN D5W 200-5 MCG/ML-% IV SOLN: 0.0000 ug/min | INTRAVENOUS | Status: DC

## 2016-02-01 MED ORDER — ACETAMINOPHEN 650 MG RE SUPP
650.0000 mg | Freq: Once | RECTAL | Status: AC
  Administered 2016-02-01: 650 mg via RECTAL

## 2016-02-01 MED ORDER — SODIUM CHLORIDE 0.9% FLUSH
3.0000 mL | INTRAVENOUS | Status: DC | PRN
  Administered 2016-02-03: 3 mL via INTRAVENOUS
  Filled 2016-02-01: qty 3

## 2016-02-01 MED ORDER — PHENYLEPHRINE HCL 10 MG/ML IJ SOLN
0.0000 ug/min | INTRAVENOUS | Status: DC
  Filled 2016-02-01: qty 2

## 2016-02-01 MED ORDER — HEPARIN SODIUM (PORCINE) 1000 UNIT/ML IJ SOLN
INTRAMUSCULAR | Status: DC | PRN
  Administered 2016-02-01: 20000 [IU] via INTRAVENOUS

## 2016-02-01 MED ORDER — LACTATED RINGERS IV SOLN
INTRAVENOUS | Status: DC
  Administered 2016-02-01: 17:00:00 via INTRAVENOUS

## 2016-02-01 MED ORDER — MIDAZOLAM HCL 10 MG/2ML IJ SOLN
INTRAMUSCULAR | Status: AC
  Filled 2016-02-01: qty 2

## 2016-02-01 MED ORDER — SODIUM CHLORIDE 0.9 % IV SOLN
INTRAVENOUS | Status: DC
  Administered 2016-02-01: 17:00:00 via INTRAVENOUS

## 2016-02-01 MED ORDER — LIDOCAINE 2% (20 MG/ML) 5 ML SYRINGE
INTRAMUSCULAR | Status: AC
  Filled 2016-02-01: qty 5

## 2016-02-01 MED ORDER — HEPARIN SODIUM (PORCINE) 1000 UNIT/ML IJ SOLN
INTRAMUSCULAR | Status: AC
  Filled 2016-02-01: qty 1

## 2016-02-01 MED ORDER — SODIUM BICARBONATE 8.4 % IV SOLN
50.0000 meq | Freq: Once | INTRAVENOUS | Status: AC
  Administered 2016-02-01: 50 meq via INTRAVENOUS

## 2016-02-01 MED ORDER — PROTAMINE SULFATE 10 MG/ML IV SOLN
INTRAVENOUS | Status: DC | PRN
  Administered 2016-02-01: 200 mg via INTRAVENOUS

## 2016-02-01 MED ORDER — METOPROLOL TARTRATE 25 MG/10 ML ORAL SUSPENSION
12.5000 mg | Freq: Two times a day (BID) | ORAL | Status: DC
Start: 2016-02-01 — End: 2016-02-02
  Filled 2016-02-01: qty 5

## 2016-02-01 MED ORDER — PROTAMINE SULFATE 10 MG/ML IV SOLN
INTRAVENOUS | Status: AC
  Filled 2016-02-01: qty 25

## 2016-02-01 MED ORDER — METOPROLOL TARTRATE 12.5 MG HALF TABLET: 12.5000 mg | ORAL_TABLET | Freq: Two times a day (BID) | ORAL | Status: DC

## 2016-02-01 MED ORDER — BUPIVACAINE HCL (PF) 0.5 % IJ SOLN
INTRAMUSCULAR | Status: AC
  Filled 2016-02-01: qty 10

## 2016-02-01 MED ORDER — ROCURONIUM BROMIDE 10 MG/ML (PF) SYRINGE
PREFILLED_SYRINGE | INTRAVENOUS | Status: AC
  Filled 2016-02-01: qty 10

## 2016-02-01 MED ORDER — ONDANSETRON HCL 4 MG/2ML IJ SOLN
4.0000 mg | Freq: Four times a day (QID) | INTRAMUSCULAR | Status: DC | PRN
  Administered 2016-02-02 – 2016-02-03 (×3): 4 mg via INTRAVENOUS
  Filled 2016-02-01 (×4): qty 2

## 2016-02-01 MED ORDER — LEVOFLOXACIN IN D5W 750 MG/150ML IV SOLN
750.0000 mg | INTRAVENOUS | Status: AC
  Administered 2016-02-02: 750 mg via INTRAVENOUS
  Filled 2016-02-01: qty 150

## 2016-02-01 MED ORDER — OXYCODONE HCL 5 MG PO TABS
5.0000 mg | ORAL_TABLET | ORAL | Status: DC | PRN
  Administered 2016-02-06: 5 mg via ORAL
  Administered 2016-02-06: 10 mg via ORAL
  Filled 2016-02-01: qty 2
  Filled 2016-02-01 (×2): qty 1

## 2016-02-01 MED ORDER — DEXMEDETOMIDINE HCL IN NACL 200 MCG/50ML IV SOLN
0.0000 ug/kg/h | INTRAVENOUS | Status: DC
  Filled 2016-02-01 (×2): qty 50

## 2016-02-01 MED ORDER — VECURONIUM BROMIDE 10 MG IV SOLR
INTRAVENOUS | Status: DC | PRN
  Administered 2016-02-01 (×3): 5 mg via INTRAVENOUS

## 2016-02-01 MED ORDER — PROPOFOL 10 MG/ML IV BOLUS
INTRAVENOUS | Status: DC | PRN
  Administered 2016-02-01: 20 mg via INTRAVENOUS

## 2016-02-01 MED ORDER — HEMOSTATIC AGENTS (NO CHARGE) OPTIME
TOPICAL | Status: DC | PRN
  Administered 2016-02-01: 1 via TOPICAL

## 2016-02-01 MED ORDER — SODIUM CHLORIDE 0.9 % IV SOLN
0.5000 g/h | Freq: Once | INTRAVENOUS | Status: DC
  Administered 2016-02-01: 0.5 g/h via INTRAVENOUS
  Filled 2016-02-01: qty 20

## 2016-02-01 MED ORDER — PHENYLEPHRINE 40 MCG/ML (10ML) SYRINGE FOR IV PUSH (FOR BLOOD PRESSURE SUPPORT)
PREFILLED_SYRINGE | INTRAVENOUS | Status: AC
  Filled 2016-02-01: qty 10

## 2016-02-01 MED ORDER — BUPIVACAINE HCL (PF) 0.5 % IJ SOLN
INTRAMUSCULAR | Status: DC | PRN
  Administered 2016-02-01: 10 mL

## 2016-02-01 MED ORDER — 0.9 % SODIUM CHLORIDE (POUR BTL) OPTIME
TOPICAL | Status: DC | PRN
  Administered 2016-02-01: 5000 mL

## 2016-02-01 MED ORDER — LIDOCAINE HCL (CARDIAC) 20 MG/ML IV SOLN
INTRAVENOUS | Status: DC | PRN
  Administered 2016-02-01: 20 mg via INTRAVENOUS

## 2016-02-01 MED ORDER — SODIUM CHLORIDE 0.9 % IV SOLN
INTRAVENOUS | Status: DC | PRN
  Administered 2016-02-01: 09:00:00 via INTRAVENOUS

## 2016-02-01 MED ORDER — PROPOFOL 10 MG/ML IV BOLUS
INTRAVENOUS | Status: AC
  Filled 2016-02-01: qty 40

## 2016-02-01 MED ORDER — PANTOPRAZOLE SODIUM 40 MG PO TBEC
40.0000 mg | DELAYED_RELEASE_TABLET | Freq: Every day | ORAL | Status: DC
  Administered 2016-02-03 – 2016-02-07 (×5): 40 mg via ORAL
  Filled 2016-02-01 (×5): qty 1

## 2016-02-01 MED ORDER — DEXTROSE 5 % IV SOLN
INTRAVENOUS | Status: DC | PRN
  Administered 2016-02-01: 09:00:00 via INTRAVENOUS

## 2016-02-01 MED ORDER — BUPIVACAINE 0.5 % ON-Q PUMP SINGLE CATH 400 ML
400.0000 mL | INJECTION | Status: AC
  Administered 2016-02-01: 400 mL
  Filled 2016-02-01: qty 400

## 2016-02-01 MED ORDER — INSULIN REGULAR BOLUS VIA INFUSION
0.0000 [IU] | Freq: Three times a day (TID) | INTRAVENOUS | Status: DC
  Filled 2016-02-01: qty 10

## 2016-02-01 MED ORDER — MORPHINE SULFATE (PF) 2 MG/ML IV SOLN: 1.0000 mg | INTRAVENOUS | Status: DC | PRN

## 2016-02-01 MED ORDER — BISACODYL 5 MG PO TBEC
10.0000 mg | DELAYED_RELEASE_TABLET | Freq: Every day | ORAL | Status: DC
  Administered 2016-02-02 – 2016-02-05 (×3): 10 mg via ORAL
  Filled 2016-02-01 (×3): qty 2

## 2016-02-01 MED ORDER — SODIUM CHLORIDE 0.9 % IV SOLN
INTRAVENOUS | Status: DC
  Administered 2016-02-01: 0.7 [IU]/h via INTRAVENOUS
  Filled 2016-02-01 (×2): qty 2.5

## 2016-02-01 MED ORDER — ACETAMINOPHEN 160 MG/5ML PO SOLN: 1000.0000 mg | Freq: Four times a day (QID) | ORAL | Status: DC

## 2016-02-01 MED ORDER — FAMOTIDINE IN NACL 20-0.9 MG/50ML-% IV SOLN
20.0000 mg | Freq: Two times a day (BID) | INTRAVENOUS | Status: AC
  Administered 2016-02-01 (×2): 20 mg via INTRAVENOUS
  Filled 2016-02-01: qty 50

## 2016-02-01 MED ORDER — LACTATED RINGERS IV SOLN: 500.0000 mL | Freq: Once | INTRAVENOUS | Status: DC | PRN

## 2016-02-01 MED ORDER — CEFAZOLIN SODIUM 1 G IJ SOLR
INTRAMUSCULAR | Status: AC
  Filled 2016-02-01: qty 20

## 2016-02-01 MED ORDER — ALBUMIN HUMAN 5 % IV SOLN
250.0000 mL | INTRAVENOUS | Status: AC | PRN
  Administered 2016-02-01 (×3): 250 mL via INTRAVENOUS
  Filled 2016-02-01: qty 250

## 2016-02-01 MED ORDER — SODIUM CHLORIDE 0.9 % IV SOLN
Freq: Once | INTRAVENOUS | Status: AC
  Administered 2016-02-01: 18:00:00 via INTRAVENOUS

## 2016-02-01 MED ORDER — MIDAZOLAM HCL 5 MG/5ML IJ SOLN
INTRAMUSCULAR | Status: DC | PRN
  Administered 2016-02-01: 4 mg via INTRAVENOUS
  Administered 2016-02-01: 2 mg via INTRAVENOUS
  Administered 2016-02-01: 4 mg via INTRAVENOUS
  Administered 2016-02-01 (×3): 2 mg via INTRAVENOUS
  Administered 2016-02-01: 4 mg via INTRAVENOUS

## 2016-02-01 MED ORDER — ARTIFICIAL TEARS OP OINT
TOPICAL_OINTMENT | OPHTHALMIC | Status: AC
  Filled 2016-02-01: qty 3.5

## 2016-02-01 MED ORDER — METOPROLOL TARTRATE 5 MG/5ML IV SOLN: 2.5000 mg | INTRAVENOUS | Status: DC | PRN

## 2016-02-01 MED ORDER — ASPIRIN 81 MG PO CHEW: 324.0000 mg | CHEWABLE_TABLET | Freq: Every day | ORAL | Status: DC

## 2016-02-01 MED ORDER — BISACODYL 10 MG RE SUPP: 10.0000 mg | Freq: Every day | RECTAL | Status: DC

## 2016-02-01 MED FILL — Heparin Sodium (Porcine) Inj 1000 Unit/ML: INTRAMUSCULAR | Qty: 30 | Status: AC

## 2016-02-01 MED FILL — Potassium Chloride Inj 2 mEq/ML: INTRAVENOUS | Qty: 40 | Status: AC

## 2016-02-01 MED FILL — Magnesium Sulfate Inj 50%: INTRAMUSCULAR | Qty: 10 | Status: AC

## 2016-02-01 SURGICAL SUPPLY — 110 items
ADAPTER CARDIO PERF ANTE/RETRO (ADAPTER) ×4 IMPLANT
ADH SKN CLS APL DERMABOND .7 (GAUZE/BANDAGES/DRESSINGS) ×3
ADPR PRFSN 84XANTGRD RTRGD (ADAPTER) ×3
BAG DECANTER FOR FLEXI CONT (MISCELLANEOUS) ×7 IMPLANT
BLADE SURG 11 STRL SS (BLADE) ×4 IMPLANT
CANISTER SUCTION 2500CC (MISCELLANEOUS) ×8 IMPLANT
CANNULA FEM VENOUS REMOTE 22FR (CANNULA) ×1 IMPLANT
CANNULA FEMORAL ART 14 SM (MISCELLANEOUS) ×4 IMPLANT
CANNULA GUNDRY RCSP 15FR (MISCELLANEOUS) ×4 IMPLANT
CANNULA OPTISITE PERFUSION 16F (CANNULA) IMPLANT
CANNULA OPTISITE PERFUSION 18F (CANNULA) ×1 IMPLANT
CANNULA SUMP PERICARDIAL (CANNULA) ×8 IMPLANT
CATH KIT ON Q 5IN SLV (PAIN MANAGEMENT) IMPLANT
CATH KIT ON-Q SILVERSOAK 5 (CATHETERS) IMPLANT
CATH KIT ON-Q SILVERSOAK 5IN (CATHETERS) ×4 IMPLANT
CONN ST 1/4X3/8  BEN (MISCELLANEOUS) ×2
CONN ST 1/4X3/8 BEN (MISCELLANEOUS) ×6 IMPLANT
CONNECTOR 1/2X3/8X1/2 3 WAY (MISCELLANEOUS) ×1
CONNECTOR 1/2X3/8X1/2 3WAY (MISCELLANEOUS) ×3 IMPLANT
CONT SPEC 4OZ CLIKSEAL STRL BL (MISCELLANEOUS) ×2 IMPLANT
CONT SPEC STER OR (MISCELLANEOUS) ×3 IMPLANT
COVER BACK TABLE 24X17X13 BIG (DRAPES) ×4 IMPLANT
COVER PROBE W GEL 5X96 (DRAPES) ×1 IMPLANT
CRADLE DONUT ADULT HEAD (MISCELLANEOUS) ×4 IMPLANT
DERMABOND ADVANCED (GAUZE/BANDAGES/DRESSINGS) ×1
DERMABOND ADVANCED .7 DNX12 (GAUZE/BANDAGES/DRESSINGS) ×6 IMPLANT
DEVICE PMI PUNCTURE CLOSURE (MISCELLANEOUS) ×4 IMPLANT
DEVICE SUT CK QUICK LOAD INDV (Prosthesis & Implant Heart) ×2 IMPLANT
DEVICE SUT CK QUICK LOAD MINI (Prosthesis & Implant Heart) ×1 IMPLANT
DEVICE TROCAR PUNCTURE CLOSURE (ENDOMECHANICALS) ×4 IMPLANT
DRAIN CHANNEL 28F RND 3/8 FF (WOUND CARE) ×8 IMPLANT
DRAPE BILATERAL SPLIT (DRAPES) ×4 IMPLANT
DRAPE C-ARM 42X72 X-RAY (DRAPES) ×4 IMPLANT
DRAPE CV SPLIT W-CLR ANES SCRN (DRAPES) ×4 IMPLANT
DRAPE INCISE IOBAN 66X45 STRL (DRAPES) ×12 IMPLANT
DRAPE SLUSH/WARMER DISC (DRAPES) ×4 IMPLANT
DRSG COVADERM 4X8 (GAUZE/BANDAGES/DRESSINGS) ×4 IMPLANT
ELECT BLADE 6.5 EXT (BLADE) ×4 IMPLANT
ELECT REM PT RETURN 9FT ADLT (ELECTROSURGICAL) ×8
ELECTRODE REM PT RTRN 9FT ADLT (ELECTROSURGICAL) ×6 IMPLANT
FELT TEFLON 1X6 (MISCELLANEOUS) ×8 IMPLANT
FEMORAL VENOUS CANN RAP (CANNULA) IMPLANT
GLOVE BIO SURGEON STRL SZ 6 (GLOVE) ×3 IMPLANT
GLOVE BIO SURGEON STRL SZ 6.5 (GLOVE) ×5 IMPLANT
GLOVE ORTHO TXT STRL SZ7.5 (GLOVE) ×13 IMPLANT
GOWN STRL REUS W/ TWL LRG LVL3 (GOWN DISPOSABLE) ×12 IMPLANT
GOWN STRL REUS W/TWL LRG LVL3 (GOWN DISPOSABLE) ×24
HEMOSTAT SURGICEL 2X4 FIBR (HEMOSTASIS) ×1 IMPLANT
IV NS IRRIG 3000ML ARTHROMATIC (IV SOLUTION) ×1 IMPLANT
KIT BASIN OR (CUSTOM PROCEDURE TRAY) ×4 IMPLANT
KIT DILATOR VASC 18G NDL (KITS) ×4 IMPLANT
KIT DRAINAGE VACCUM ASSIST (KITS) ×1 IMPLANT
KIT ROOM TURNOVER OR (KITS) ×4 IMPLANT
KIT SUCTION CATH 14FR (SUCTIONS) ×4 IMPLANT
KIT SUT CK MINI COMBO 4X17 (Prosthesis & Implant Heart) ×1 IMPLANT
LEAD PACING MYOCARDI (MISCELLANEOUS) ×4 IMPLANT
LINE VENT (MISCELLANEOUS) ×1 IMPLANT
NDL AORTIC ROOT 14G 7F (CATHETERS) ×3 IMPLANT
NEEDLE AORTIC ROOT 14G 7F (CATHETERS) ×4 IMPLANT
NS IRRIG 1000ML POUR BTL (IV SOLUTION) ×20 IMPLANT
PACK OPEN HEART (CUSTOM PROCEDURE TRAY) ×4 IMPLANT
PAD ARMBOARD 7.5X6 YLW CONV (MISCELLANEOUS) ×8 IMPLANT
PAD ELECT DEFIB RADIOL ZOLL (MISCELLANEOUS) ×4 IMPLANT
PATCH CORMATRIX 4CMX7CM (Prosthesis & Implant Heart) ×1 IMPLANT
RETRACTOR TRL SOFT TISSUE LG (INSTRUMENTS) IMPLANT
RETRACTOR TRM SOFT TISSUE 7.5 (INSTRUMENTS) IMPLANT
RING MITRAL MEMO 3D 28MM SMD28 (Prosthesis & Implant Heart) ×1 IMPLANT
SET CANNULATION TOURNIQUET (MISCELLANEOUS) ×4 IMPLANT
SET CARDIOPLEGIA MPS 5001102 (MISCELLANEOUS) ×1 IMPLANT
SET IRRIG TUBING LAPAROSCOPIC (IRRIGATION / IRRIGATOR) ×4 IMPLANT
SOLUTION ANTI FOG 6CC (MISCELLANEOUS) ×4 IMPLANT
SPONGE GAUZE 4X4 12PLY STER LF (GAUZE/BANDAGES/DRESSINGS) ×4 IMPLANT
SPONGE LAP 4X18 X RAY DECT (DISPOSABLE) ×1 IMPLANT
STOPCOCK 4 WAY LG BORE MALE ST (IV SETS) ×1 IMPLANT
SUT BONE WAX W31G (SUTURE) ×4 IMPLANT
SUT E-PACK MINIMALLY INVASIVE (SUTURE) ×4 IMPLANT
SUT ETHIBOND (SUTURE) ×3 IMPLANT
SUT ETHIBOND 2 0 SH (SUTURE) ×1 IMPLANT
SUT ETHIBOND 2-0 RB-1 WHT (SUTURE) ×3 IMPLANT
SUT ETHIBOND X763 2 0 SH 1 (SUTURE) ×4 IMPLANT
SUT GORETEX CV 4 TH 22 36 (SUTURE) ×4 IMPLANT
SUT GORETEX CV-5THC-13 36IN (SUTURE) ×8 IMPLANT
SUT GORETEX CV4 TH-18 (SUTURE) ×8 IMPLANT
SUT PROLENE 3 0 SH1 36 (SUTURE) ×16 IMPLANT
SUT PROLENE 4 0 RB 1 (SUTURE) ×20
SUT PROLENE 4-0 RB1 .5 CRCL 36 (SUTURE) IMPLANT
SUT PTFE CHORD X 24MM (SUTURE) ×2 IMPLANT
SUT SILK  1 MH (SUTURE) ×3
SUT SILK 1 MH (SUTURE) IMPLANT
SUT SILK 2 0 SH CR/8 (SUTURE) IMPLANT
SUT SILK 3 0 SH CR/8 (SUTURE) ×1 IMPLANT
SUT VIC AB 2-0 CTX 36 (SUTURE) IMPLANT
SUT VIC AB 3-0 SH 8-18 (SUTURE) ×1 IMPLANT
SUT VICRYL 2 TP 1 (SUTURE) IMPLANT
SYRINGE 10CC LL (SYRINGE) ×4 IMPLANT
SYSTEM SAHARA CHEST DRAIN ATS (WOUND CARE) ×4 IMPLANT
TAPE CLOTH SURG 4X10 WHT LF (GAUZE/BANDAGES/DRESSINGS) ×1 IMPLANT
TOWEL OR 17X24 6PK STRL BLUE (TOWEL DISPOSABLE) ×8 IMPLANT
TOWEL OR 17X26 10 PK STRL BLUE (TOWEL DISPOSABLE) ×8 IMPLANT
TRAY CATH LUMEN 1 20CM STRL (SET/KITS/TRAYS/PACK) ×1 IMPLANT
TRAY FOLEY IC TEMP SENS 16FR (CATHETERS) ×4 IMPLANT
TROCAR XCEL BLADELESS 5X75MML (TROCAR) ×4 IMPLANT
TROCAR XCEL NON-BLD 11X100MML (ENDOMECHANICALS) ×8 IMPLANT
TUBE SUCT INTRACARD DLP 20F (MISCELLANEOUS) ×4 IMPLANT
TUBING ART PRESS 48 MALE/FEM (TUBING) ×2 IMPLANT
TUNNELER SHEATH ON-Q 11GX8 DSP (PAIN MANAGEMENT) ×1 IMPLANT
UNDERPAD 30X30 INCONTINENT (UNDERPADS AND DIAPERS) ×4 IMPLANT
WATER STERILE IRR 1000ML POUR (IV SOLUTION) ×8 IMPLANT
WIRE .035 3MM-J 145CM (WIRE) ×4 IMPLANT
WIRE J 3MM .035X145CM (WIRE) ×4 IMPLANT

## 2016-02-01 NOTE — OR Nursing (Signed)
1409 SICU first call.

## 2016-02-01 NOTE — Progress Notes (Signed)
  Echocardiogram Echocardiogram Transesophageal has been performed.  Destiny Andersen 02/01/2016, 9:59 AM

## 2016-02-01 NOTE — Progress Notes (Signed)
Patient ID: Destiny Andersen, female   DOB: 1945-09-19, 70 y.o.   MRN: [id number] EVENING ROUNDS NOTE :     [address]       [place] [address]             [phone]                 Day of Surgery Procedure(s) (LRB): MINIMALLY INVASIVE MITRAL VALVE REPAIR (MVR) with closure of PFO (Right) TRANSESOPHAGEAL ECHOCARDIOGRAM (TEE) (N/A) PATENT FORAMEN OVALE CLOSURE (N/A)  Total Length of Stay:  LOS: 0 days  BP 96/70   Pulse 70   Temp (!) 96.6 F (35.9 C)   Resp 12   Ht 5\' 4"  (1.626 m)   Wt 126 lb (57.2 kg)   SpO2 96%   BMI 21.63 kg/m   .Intake/Output      08/15 0701 - 08/16 0700 08/16 0701 - 08/17 0700   I.V. (mL/kg)  2550 (44.6)   Blood  1275   IV Piggyback  500   Total Intake(mL/kg)  4325 (75.6)   Urine (mL/kg/hr)  2425 (3.7)   Blood  1300 (2)   Chest Tube  200 (0.3)   Total Output   3925   Net   +400          . sodium chloride 20 mL/hr at 02/01/16 1654  . sodium chloride 100 mL/hr at 02/01/16 1702  . [START ON 02/02/2016] sodium chloride    . sodium chloride 20 mL/hr at 02/01/16 1655  . dexmedetomidine 0.1 mcg/kg/hr (02/01/16 1800)  . insulin (NOVOLIN-R) infusion 0.7 Units/hr (02/01/16 1825)  . lactated ringers 20 mL/hr at 02/01/16 1700  . lactated ringers 20 mL/hr at 02/01/16 1702  . nitroGLYCERIN Stopped (02/01/16 1805)  . phenylephrine (NEO-SYNEPHRINE) Adult infusion Stopped (02/01/16 1740)     Lab Results  Component Value Date   WBC 6.4 02/01/2016   HGB 10.7 (L) 02/01/2016   HGB 9.9 (L) 02/01/2016   HCT 32.7 (L) 02/01/2016   HCT 29.0 (L) 02/01/2016   PLT 56 (L) 02/01/2016   GLUCOSE 108 (H) 02/01/2016   ALT 17 01/30/2016   AST 26 01/30/2016   NA 146 (H) 02/01/2016   K 3.6 02/01/2016   CL 107 02/01/2016   CREATININE 0.60 02/01/2016   BUN 10 02/01/2016   CO2 22 01/30/2016   INR 2.39 02/01/2016   HGBA1C 5.5 01/30/2016   Remains sedated on vent, plts , low getting plts  And volume, make need dopamine if Ci does not increase with  volume A/v paced   Grace Isaac MD  Beeper [phone] Office [phone] 02/01/2016 6:29 PM

## 2016-02-01 NOTE — Anesthesia Procedure Notes (Signed)
Procedure Name: Intubation Date/Time: 02/01/2016 4:03 PM Performed by: Jacquiline Doe A Pre-anesthesia Checklist: Patient identified, Emergency Drugs available, Suction available and Patient being monitored Patient Re-evaluated:Patient Re-evaluated prior to inductionOxygen Delivery Method: Circle System Utilized Preoxygenation: Pre-oxygenation with 100% oxygen Intubation Type: Combination inhalational/ intravenous induction Laryngoscope Size: Mac and 3 Grade View: Grade I Tube type: Oral Tube size: 8.0 mm Number of attempts: 1 Airway Equipment and Method: Stylet and Video-laryngoscopy (Tube change EBT to OETT WITHOUT INCIDENT .) Placement Confirmation: ETT inserted through vocal cords under direct vision,  positive ETCO2 and breath sounds checked- equal and bilateral Secured at: 21 cm Tube secured with: Tape Dental Injury: Teeth and Oropharynx as per pre-operative assessment

## 2016-02-01 NOTE — OR Nursing (Signed)
[place] call made to SICU.

## 2016-02-01 NOTE — Anesthesia Procedure Notes (Signed)
Central Venous Catheter Insertion Performed by: anesthesiologist 02/01/2016 8:19 AM Patient location: Pre-op. Preanesthetic checklist: patient identified, IV checked, site marked, risks and benefits discussed, surgical consent, monitors and equipment checked, pre-op evaluation, timeout performed and anesthesia consent Position: supine Lidocaine 1% used for infiltration Landmarks identified and Seldinger technique used Catheter size: 8.5 Fr PA cath was placed.Sheath introducer Swan type and PA catheter depth:thermodilutionProcedure performed using ultrasound guided technique. Attempts: 1 Following insertion, line sutured and dressing applied. Post procedure assessment: blood return through all ports, free fluid flow and no air. Patient tolerated the procedure well with no immediate complications. Additional procedure comments: PA catheter:  Routine monitors. Timeout, sterile prep, drape, FBP L neck.  Trendelenburg position.  1% Lido local, finder and trocar LIJ 1st pass with US guidance.  Cordis placed over J wire. PA catheter in easily.  Sterile dressing applied.  Patient tolerated well, VSS.  Jenita Seashore, MD.

## 2016-02-01 NOTE — Op Note (Addendum)
CARDIOTHORACIC SURGERY OPERATIVE NOTE  Date of Procedure:  02/01/2016  Preoperative Diagnosis: Severe Mitral Regurgitation  Postoperative Diagnosis: Same  Procedure:    Minimally-Invasive Mitral Valve Repair  Complex valvuloplasty including triangular resection of flail segment of posterior leaflet  Artificial Gore-tex neochord placement x10  Sorin Memo 3D Ring Annuloplasty (size 38mm, catalog # [id number], serial # [id number])  Closure of patent foramen ovale  Placement of left femoral arterial line   Surgeon: Valentina Gu. Roxy Manns, MD  Assistant: Valentina Gu  Anesthesia: Midge Minium, MD  Operative Findings:  Fibroelastic deficiency type degenerative disease with multiple ruptured chordae tendinae and flail segment of posterior leaflet  Type II dysfunction with severe mitral regurgitation  Normal LV systolic function  Patent foramen ovale  No residual mitral regurgitation after successful valve repair                     BRIEF CLINICAL NOTE AND INDICATIONS FOR SURGERY  Patient is a 70 year old female with long history of mitral valve prolapse who has been referred for surgical consultation to discuss treatment options for management of severe asymptomatic primary mitral regurgitation. The patient reportedly was told that she had mitral valve prolapse many years ago. Approximately 3 years ago she was noted by her primary care physician to have a prominent murmur on physical exam and echocardiogram revealed the presence of mitral valve prolapse with severe mitral regurgitation. She has been followed carefully by Dr. Acie Fredrickson ever since. She has remained physically active and reportedly asymptomatic. She was seen in follow-up recently by Dr. Acie Fredrickson and transthoracic echocardiogram revealed mitral valve prolapse with a flail segment of the posterior leaflet of mitral valve and severe mitral regurgitation. Left ventricular size and systolic function remains normal.  Transesophageal echocardiogram was performed, confirming the presence of a flail segment of the posterior leaflet with ruptured chordae tendineae. The patient was referred for surgical consultation.  The patient has been seen in consultation and counseled at length regarding the indications, risks and potential benefits of surgery.  All questions have been answered, and the patient provides full informed consent for the operation as described.    DETAILS OF THE OPERATIVE PROCEDURE  Preparation:  The patient is brought to the operating room on the above mentioned date and central monitoring was established by the anesthesia team including placement of Swan-Ganz catheter through the left internal jugular vein.  A radial arterial line is placed. The patient is placed in the supine position on the operating table.  Intravenous antibiotics are administered. General endotracheal anesthesia is induced uneventfully. The patient is initially intubated using a dual lumen endotracheal tube.  A Foley catheter is placed.  Baseline transesophageal echocardiogram was performed.  Findings were notable for classical fibroelastic deficiency type degenerative disease of the mitral valve with obvious ruptured chordae tendinae and a flail segment (P2) involving the middle scallop of the posterior leaflet. There was type II dysfunction with severe mitral regurgitation. The left atrium was dilated. There was normal left ventricular size and systolic function. There was asymmetric septal hypertrophy. The aortic valve appeared normal. There was severe right displacement of the intra-atrial septum because of elevated left atrial pressure.  A soft roll is placed behind the patient's left scapula and the neck gently extended and turned to the left.   The patient's right neck, chest, abdomen, both groins, and both lower extremities are prepared and draped in a sterile manner. A time out procedure is performed.  Surgical  Approach:  A right  miniature anterolateral thoracotomy incision is performed. The incision is placed just lateral to and superior to the right nipple. The pectoralis major muscle is retracted medially and completely preserved. The right pleural space is entered through the 3rd intercostal space. A soft tissue retractor is placed.  Two 11 mm ports are placed through separate stab incisions inferiorly. The right pleural space is insufflated continuously with carbon dioxide gas through the posterior port during the remainder of the operation.  A pledgeted sutures placed through the dome of the right hemidiaphragm and retracted inferiorly to facilitate exposure.  A longitudinal incision is made in the pericardium 3 cm anterior to the phrenic nerve and silk traction sutures are placed on either side of the incision for exposure.     Placement of Left Femoral Arterial Line:  A left femoral arterial line is placed using the Seldinger technique because of inconsistent function of the radial arterial line placed by the anesthesia team.   Extracorporeal Cardiopulmonary Bypass and Myocardial Protection:  A small incision is made in the right inguinal crease and the anterior surface of the right common femoral artery and right common femoral vein are identified.  The patient is placed in Trendelenburg position. The right internal jugular vein is cannulated with Seldinger technique and a guidewire advanced into the right atrium. The patient is heparinized systemically. The right internal jugular vein is cannulated with a 14 [place] pediatric femoral venous cannula. Pursestring sutures are placed on the anterior surface of the right common femoral vein and right common femoral artery. The right common femoral vein is cannulated with the Seldinger technique and a guidewire is advanced under transesophageal echocardiogram guidance through the right atrium. The femoral vein is cannulated with a long 22 French femoral  venous cannula. The right common femoral artery is cannulated with Seldinger technique and a flexible guidewire is advanced until it can be appreciated intraluminally in the descending thoracic aorta on transesophageal echocardiogram. The femoral artery is cannulated with an 18 French femoral arterial cannula.  Adequate heparinization is verified.     The entire pre-bypass portion of the operation was notable for stable hemodynamics.  Cardiopulmonary bypass was begun.  Vacuum assist venous drainage is utilized. The incision in the pericardium is extended in both directions. Venous drainage and exposure are notably excellent. A retrograde cardioplegia cannula is placed through the right atrium into the coronary sinus using transesophageal echocardiogram guidance.  An antegrade cardioplegia cannula is placed in the ascending aorta.    The patient is cooled to 28C systemic temperature.  The aortic cross clamp is applied and cold blood cardioplegia is delivered initially in an antegrade fashion through the aortic root.   Supplemental cardioplegia is given retrograde through the coronary sinus catheter. The initial cardioplegic arrest is rapid with early diastolic arrest.  Repeat doses of cardioplegia are administered intermittently every 20 to 30 minutes throughout the entire cross clamp portion of the operation through the aortic root and through the coronary sinus catheter in order to maintain completely flat electrocardiogram.  Myocardial protection was felt to be excellent.   Closure of Patent Foramen Ovale:  A left atriotomy incision was performed through the interatrial groove and extended partially across the back wall of the left atrium after opening the oblique sinus inferiorly.  There was an obvious patent foramen ovale noted with blood passing through it from the right to the left atrium.  The patent foramen ovale was closed using running 4-0 Prolene suture.   Mitral Valve Repair:  The  mitral  valve is exposed using a self-retaining retractor.  The mitral valve was inspected and notable for classical fibroelastic deficiency type degenerative disease with multiple ruptured chordae tendineae involving the middle scallop (P2) of the posterior leaflet. There was mild fibrosis and thickening of the leaflet. The remainder of the valve appeared essentially normal. There was no significant calcification.  The flail segment of P2 was repaired using a combination of a simple triangular resection and artificial Gore-Tex neochords.  Initially the triangular resection is performed. Less than 25% of the total surface area of P2 was resected. There did not appear to be any need for leaflet shortening.  Artificial neochord placement was performed using Chord-X multi-strand CV-4 Goretex pre-measured loops.  The appropriate cord length was measured from corresponding normal length primary cords from the P1 and P3 segments of the posterior leaflet. The papillary muscle suture of the Chord-X multi-strand suture was placed through the head of the anterior papillary muscle in a horizontal mattress fashion and tied over Teflon felt pledgets. Each of the three pre-measured loops were then reimplanted into the free margin of the P2 segment of the posterior leaflet on the anterior side.  A second set of premeasured loops was implanted. The papillary muscle suture of the Chord-X multi-strand suture was placed through the head of the posterior papillary muscle in a horizontal mattress fashion and tied over Teflon felt pledgets. Each of the three pre-measured loops were then reimplanted into the free margin of the P2 segment of the posterior leaflet on the posterior side.  A total of 5 pairs were implanted. The sixth pair was felt unnecessary and discarded. The intervening vertical defect in the posterior leaflet was closed using interrupted everting simple CV 5 Gore-Tex suture.  Interrupted 2-0 Ethibond horizontal mattress  sutures are placed circumferentially around the entire mitral valve annulus. The valve was tested with saline and appeared competent even without ring annuloplasty complete. The valve was sized to a 28 mm annuloplasty ring, based upon the transverse distance between the left and right commissures and the height of the anterior leaflet, corresponding to a size just slightly larger than the overall surface area of the anterior leaflet.  A Sorin Memo 3D annuloplasty ring (size 25mm, catalog [id number], serial [id number]) was secured in place uneventfully. All ring sutures were secured using a Cor-knot device.  The valve is tested with saline and appears to be perfectly competent with a broad symmetrical line of coaptation of the anterior and posterior leaflet. There is no residual leak. There was a broad, symmetrical line of coaptation of the anterior and posterior leaflet which was confirmed using the blue ink test.  Rewarming is begun.   Procedure Completion:  The atriotomy was closed using a 2-layer closure of running 3-0 Prolene suture after placing a sump drain across the mitral valve to serve as a left ventricular vent.  One final dose of warm retrograde "hot shot" cardioplegia was administered retrograde through the coronary sinus catheter while all air was evacuated through the aortic root.  The aortic cross clamp was removed after a total cross clamp time of 127 minutes.  Epicardial pacing wires are fixed to the inferior wall of the right ventricule and to the right atrial appendage. The patient is rewarmed to 37C temperature. The left ventricular vent is removed.  The patient is ventilated and flow volumes turndown while the mitral valve repair is inspected using transesophageal echocardiogram. The valve repair appears intact with no residual leak. The antegrade cardioplegia cannula  is now removed. The patient is weaned and disconnected from cardiopulmonary bypass.  The patient's rhythm at separation from  bypass was junctional.  The patient was weaned from bypass without any inotropic support. Total cardiopulmonary bypass time for the operation was 193 minutes.  Followup transesophageal echocardiogram performed after separation from bypass revealed a well-seated annuloplasty ring in the mitral position with a normal functioning mitral valve. There was no residual leak.  Left ventricular function was unchanged from preoperatively.  The mean gradient across the mitral valve was estimated to be 3 mmHg.  The femoral arterial and venous cannulae were removed uneventfully. There was a palpable pulse in the distal right common femoral artery after removal of the cannula. Protamine was administered to reverse the anticoagulation. The right internal jugular cannula was removed and manual pressure held on the neck for 15 minutes.  Single lung ventilation was begun. The atriotomy closure was inspected for hemostasis. The pericardial sac was drained using a 28 French Bard drain placed through the anterior port incision.  The pericardium was closed using a patch of core matrix bovine submucosal tissue patch. The right pleural space is irrigated with saline solution and inspected for hemostasis. The right pleural space was drained using a 28 French Bard drain placed through the posterior port incision. The miniature thoracotomy incision was closed in multiple layers in routine fashion. The right groin incision was inspected for hemostasis and closed in multiple layers in routine fashion.  The post-bypass portion of the operation was notable for stable rhythm and hemodynamics.  The patient received 2 units packed red blood cells during the procedure due to anemia which was present preoperatively and exacerbated by acute blood loss and hemodilution during cardiopulmonary bypass.  The patient received a total of 1 pack adult platelets due to coagulopathy and thrombocytopenia after separation from cardiopulmonary bypass and  reversal of heparin with protamine.    Disposition:  The patient tolerated the procedure well.  The patient was reintubated using a single lumen endotracheal tube and subsequently transported to the surgical intensive care unit in stable condition. There were no intraoperative complications. All sponge instrument and needle counts are verified correct at completion of the operation.     Valentina Gu. Roxy Manns MD 02/01/2016 4:01 PM

## 2016-02-01 NOTE — Anesthesia Procedure Notes (Signed)
Procedure Name: Intubation Date/Time: 02/01/2016 9:04 AM Performed by: Jacquiline Doe A Pre-anesthesia Checklist: Patient identified, Emergency Drugs available, Suction available and Patient being monitored Patient Re-evaluated:Patient Re-evaluated prior to inductionOxygen Delivery Method: Circle System Utilized Preoxygenation: Pre-oxygenation with 100% oxygen Intubation Type: IV induction Ventilation: Mask ventilation without difficulty Laryngoscope Size: Mac and 3 Grade View: Grade I Tube type: Oral Endobronchial tube: Left, Double lumen EBT, EBT position confirmed by auscultation and EBT position confirmed by fiberoptic bronchoscope and 35 Fr Number of attempts: 1 Airway Equipment and Method: Stylet and Oral airway Placement Confirmation: ETT inserted through vocal cords under direct vision,  positive ETCO2 and breath sounds checked- equal and bilateral Secured at: 27 cm Tube secured with: Tape Dental Injury: Teeth and Oropharynx as per pre-operative assessment

## 2016-02-01 NOTE — OR Nursing (Signed)
1523 SICU second call made.

## 2016-02-01 NOTE — Significant Event (Signed)
Spoke with Dr. Servando Snare during evening rounds regarding index <1.3-1.5, has given 2 albumins so far and getting blood products. Per MD, RN can start dopamine low dose if CI does not increase with volume.

## 2016-02-01 NOTE — Interval H&P Note (Signed)
History and Physical Interval Note:  02/01/2016 6:41 AM  Destiny Andersen  has presented today for surgery, with the diagnosis of MR  The various methods of treatment have been discussed with the patient and family. After consideration of risks, benefits and other options for treatment, the patient has consented to  Procedure(s): MINIMALLY INVASIVE MITRAL VALVE REPAIR (MVR) (Right) TRANSESOPHAGEAL ECHOCARDIOGRAM (TEE) (N/A) as a surgical intervention .  The patient's history has been reviewed, patient examined, no change in status, stable for surgery.  I have reviewed the patient's chart and labs.  Questions were answered to the patient's satisfaction.     Rexene Alberts

## 2016-02-01 NOTE — Brief Op Note (Signed)
02/01/2016  3:58 PM  PATIENT:  Destiny Andersen  70 y.o. female  PRE-OPERATIVE DIAGNOSIS:  MR  POST-OPERATIVE DIAGNOSIS:  MR  PROCEDURE:  Procedure(s): MINIMALLY INVASIVE MITRAL VALVE REPAIR (MVR) with closure of PFO (Right) TRANSESOPHAGEAL ECHOCARDIOGRAM (TEE) (N/A) PATENT FORAMEN OVALE CLOSURE (N/A)  SURGEON:    Rexene Alberts, MD  ASSISTANTS:  Valentina Gu  ANESTHESIA:   Annye Asa, MD  CROSSCLAMP TIME:   127'  CARDIOPULMONARY BYPASS TIME: 193'  FINDINGS:  Fibroelastic deficiency type degenerative disease with multiple ruptured chordae tendinae and flail segment of posterior leaflet  Type II dysfunction with severe mitral regurgitation  Normal LV systolic function  Patent foramen ovale  No residual mitral regurgitation after successful valve repair  COMPLICATIONS: None  BASELINE WEIGHT: 57 kg  PATIENT DISPOSITION:   TO SICU IN STABLE CONDITION  Rexene Alberts, MD 02/01/2016 3:59 PM

## 2016-02-01 NOTE — H&P (Signed)
[address]       [place] [address]             [phone]          CARDIOTHORACIC SURGERY HISTORY AND PHYSICAL EXAM  Referring Provider is [name], Wonda Cheng, MD PCP is Marjorie Smolder, MD      Chief Complaint  Patient presents with  . Mitral Regurgitation    SEVERE...TEE 10/14/15    HPI:  Patient is a 70 year old female with long history of mitral valve prolapse who has been referred for surgical consultation to discuss treatment options for management of severe asymptomatic primary mitral regurgitation. The patient reportedly was told that she had mitral valve prolapse many years ago. Approximately 3 years ago she was noted by her primary care physician to have a prominent murmur on physical exam and echocardiogram revealed the presence of mitral valve prolapse with severe mitral regurgitation.  She has been followed carefully by Dr. Acie Fredrickson ever since.  She has remained physically active and reportedly asymptomatic. She was seen in follow-up recently by Dr. Acie Fredrickson and transthoracic echocardiogram revealed mitral valve prolapse with a flail segment of the posterior leaflet of mitral valve and severe mitral regurgitation. Left ventricular size and systolic function remains normal. Transesophageal echocardiogram was performed, confirming the presence of a flail segment of the posterior leaflet with ruptured chordae tendineae. The patient was referred for surgical consultation.  The patient is married and lives locally in [place] with her husband. 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  Now that she has been completely retired for 10 years, the patient remains fairly active physically. She walks her dog every day and exercises regularly with a trainer. She does not typically push herself with intense aerobic physical  activity but she certainly remains active. She denies any symptoms of exertional shortness of breath or fatigue. She denies any recent significant change in her exercise tolerance, although she states that she would get winded if she pushed herself strenuously. She has never experienced any exertional chest pain or chest tightness. She denies any history of PND, orthopnea, palpitations, dizzy spells, or syncope.  Patient returns to the office today for follow-up of severe symptomatic primary mitral regurgitation with tentative plans to proceed with minimally invasive mitral valve repair later this week. She was originally seen in consultation on 10/17/2015 and she was seen more recently on 01/02/2016. Since then she has remained clinically stable. She describes stable symptoms of mild exertional shortness of breath. Symptoms have not progressed. She denies any recent fevers, chills, or productive cough. Appetite is stable. She is anxious to get her surgery over with.  Past Medical History:  Diagnosis Date  . Cancer [facility]) 1983   Cervical cancer.  Skin Cancer- leg  . Chronic headaches    "? Migraines"- does not have then any more  . Constipation   . Elevated C-reactive protein (CRP)   . Family history of breast cancer    Mother  . Fatigue   . Heart murmur    ECHO 02/10/13 EF = 60-65%, moderate posterior mitral valve prolapse w/possible flail segment, severe MR, mitral regurgitant jet is anteriorly directed.  Marland Kitchen History of blood transfusion    pre-eclampsia   . Hyperpotassemia    Migrated  . Medicare annual wellness visit, subsequent   . Mitral valve  prolapse   . MR (mitral regurgitation)    ECHO 02/10/13 EF = 60-65%, moderate posterior mitral valve prolapse w/possible flail segment, severe MR, mitral regurgitant jet is anteriorly directed.  . Risk for falls   . Routine general medical examination at a health care facility   . Seizures ([place])    with pre- echa  . Shortness of breath dyspnea     with exertion    Past Surgical History:  Procedure Laterality Date  . BREAST BIOPSY Right 1998   benign cyst  . CARDIAC CATHETERIZATION N/A 12/21/2015   Procedure: Right/Left Heart Cath and Coronary Angiography;  Surgeon: Sherren Mocha, MD;  Location: [facility];  Service: Cardiovascular;  Laterality: N/A;  . COLONOSCOPY  08/2001   Colon cancer screening in 01/2012  . LAPAROSCOPIC CHOLECYSTECTOMY  1990  . TEE WITHOUT CARDIOVERSION N/A 10/14/2015   Procedure: TRANSESOPHAGEAL ECHOCARDIOGRAM (TEE);  Surgeon: Thayer Headings, MD;  Location: [facility];  Service: Cardiovascular;  Laterality: N/A;  . TOTAL ABDOMINAL HYSTERECTOMY  1983   Cervical cancer 1983    Family History  Problem Relation Age of Onset  . Cancer Mother     breast  . Hypertension Mother   . Heart disease Mother   . CAD Mother   . AAA (abdominal aortic aneurysm) Father   . CAD Father   . Hypertension Father   . Heart disease Father   . High Cholesterol Brother   . Cancer Brother     skin  . Hypertension Brother     Social History Social History  Substance Use Topics  . Smoking status: Former Smoker    Years: 25.00    Types: Cigarettes    Quit date: 06/18/1985  . Smokeless tobacco: Never Used  . Alcohol use 1.0 oz/week    2 Standard drinks or equivalent per week     Comment: rare    Prior to Admission medications   Medication Sig Start Date End Date Taking? Authorizing Provider  amiodarone (PACERONE) 200 MG tablet Take 1 tablet (200 mg total) by mouth daily. Begin 7 days prior to surgery 01/02/16  Yes Rexene Alberts, MD  aspirin 81 MG tablet Take 81 mg by mouth daily.   Yes Historical Provider, MD  calcium citrate-vitamin D (CITRACAL+D) 315-200 MG-UNIT tablet Take 1 tablet by mouth 3 (three) times daily. VITAMIN D DOSAGE IS 250MG  INSTEAD OF 200   Yes Historical Provider, MD    Allergies  Allergen Reactions  . Penicillins Anaphylaxis    Has patient had a PCN reaction causing immediate rash,  facial/tongue/throat swelling, SOB or lightheadedness with hypotension: Yes Has patient had a PCN reaction causing severe rash involving mucus membranes or skin necrosis: No Has patient had a PCN reaction that required hospitalization Yes Has patient had a PCN reaction occurring within the last 10 years: No If all of the above answers are "NO", then may proceed with Cephalosporin use.   . Codeine Other (See Comments)    AND CODEINE DERIVATIVES-  Abdominal pain  . Doxycycline Hyclate Nausea And Vomiting  . Ultram [Tramadol] Nausea And Vomiting      Review of Systems:                        General:  normal appetite, normal energy, no weight gain, no weight loss, no fever                       Cardiac:                                           no chest pain with exertion, no chest pain at rest, no SOB with no exertion, no resting SOB, no PND, no orthopnea, no palpitations, no arrhythmia, no atrial fibrillation, + chronic trace LE edema, no dizzy spells, no syncope                       Respiratory:                                     no shortness of breath, no home oxygen, no productive cough, no dry cough, no bronchitis, no wheezing, no hemoptysis, no asthma, no pain with inspiration or cough, no sleep apnea, no CPAP at night                       GI:                                                             no difficulty swallowing, no reflux, no frequent heartburn, no hiatal hernia, no abdominal pain, no constipation, no diarrhea, no hematochezia, no hematemesis, no melena                       GU:                                                            no dysuria,  no frequency, no urinary tract infection, no hematuria, no enlarged prostate, no kidney stones, no kidney disease                       Vascular:                                         no pain suggestive of claudication, no pain in feet, no leg cramps, no varicose veins, no DVT, no  non-healing foot ulcer                       Neuro:                                                       no stroke, no TIA's, no seizures, no headaches, no temporary blindness one eye,  no slurred speech, no peripheral neuropathy,  no chronic pain, no instability of gait, no memory/cognitive dysfunction                       Musculoskeletal:                   no arthritis, no joint swelling, no myalgias, no difficulty walking, normal mobility                        Skin:                                                          no rash, no itching, no skin infections, no pressure sores or ulcerations                       Psych:                                                       no anxiety, no depression, no nervousness, no unusual recent stress                       Eyes:                                                         no blurry vision, + floaters, no recent vision changes, + wears glasses or contacts                       ENT:                                                          no hearing loss, no loose or painful teeth, no dentures, last saw dentist November 2016                       Hematologic:                                   no easy bruising, no abnormal bleeding, no clotting disorder, no frequent epistaxis                       Endocrine:                                       no diabetes, does not check CBG's at home  Physical Exam:                        BP 128/71 mmHg  Pulse 71  Resp 16  Ht 5\' 4"  (1.626 m)  Wt 125 lb (56.7 kg)  BMI 21.45 kg/m2  SpO2 98%                       General:                                            well-appearing                       HEENT:                                           Unremarkable                        Neck:                                                         no JVD, no bruits, no adenopathy                        Chest:                                                         clear to auscultation, symmetrical breath sounds, no wheezes, no rhonchi                        CV:                                                            RRR, grade IV/VI holosystolic murmur                        Abdomen:                                        soft, non-tender, no masses                        Extremities:                                     warm, well-perfused, pulses palpable, no LE edema  Rectal/GU                                       Deferred                       Neuro:                                                       Grossly non-focal and symmetrical throughout                       Skin:                                                          Clean and dry, no rashes, no breakdown   Diagnostic Tests:  Transthoracic Echocardiography  Patient:  Ayveri, Bank[place] MR #:    [id number] Study Date: 10/01/2013 Gender:   F Age:    64 Height:   162.6cm Weight:   59kg BSA:    1.83m^2 Pt. Status: Room:  Rex Kras, Jasmine Awe  [name], Carrington Clamp, Dalton SONOGRAPHER Victorio Palm, RDCS PERFORMING  [facility], Outpatient cc:  ------------------------------------------------------------ LV EF: 60% -  65%  ------------------------------------------------------------ Indications:   Mitral valve prolapse 424.0. Murmur 785.2.  ------------------------------------------------------------ History:  PMH: Acquired from the patient and from the patient's chart. [id number] Systolic murmur. Severe mitral regurgitation. Moderate mitral valve prolapse. Risk factors: Former tobacco use.  ------------------------------------------------------------ Study Conclusions  - Left ventricle: The cavity size was normal. Wall thickness was normal. Systolic function was normal. The estimated ejection fraction was in the range of 60% to 65%. Wall motion was normal; there were no  regional wall motion abnormalities. Features are consistent with a pseudonormal left ventricular filling pattern, with concomitant abnormal relaxation and increased filling pressure (grade 2 diastolic dysfunction). - Aortic valve: There was no stenosis. - Mitral valve: Prolapse of posterior mitral leaflet withpartial flail P2 segment . Severe, anteriorly-directed mitral regurgitation. - Left atrium: The atrium was mildly dilated. - Tricuspid valve: Peak RV-RA gradient: 33mm Hg (S). - Pulmonary arteries: PA peak pressure: 31mm Hg (S). - Inferior vena cava: The vessel was normal in size; the respirophasic diameter changes were in the normal range (= 50%); findings are consistent with normal central venous pressure. Impressions:  - Normal LV size with EF 60-65%. Moderate diastolic dysfunction. Severe mitral regurgitation with flail P2 segment of posterior leaflet. Normal RV size and systolic function. Borderline pulmonary hypertension.  ------------------------------------------------------------ Labs, prior tests, procedures, and surgery: Echocardiography (August 2014).  The mitral valve showed severe regurgitation. EF was 65%. Moderate posterior leaflet mitral valve prolapse with possible flail segment.  Transthoracic echocardiography. M-mode, complete 2D, spectral Doppler, and color Doppler. Height: Height: 162.6cm. Height: 64in. Weight: Weight: 59kg. Weight: 129.7lb. Body mass index: BMI: 22.3kg/m^2. Body surface area:  BSA: 1.7m^2. Blood pressure:   119/70. Patient status: Outpatient. Location: [facility]  ------------------------------------------------------------  ------------------------------------------------------------ Left ventricle: The cavity size was normal. Wall thickness was normal. Systolic function was normal. The  estimated ejection fraction was in the range of 60% to 65%. Wall motion was normal; there  were no regional wall motion abnormalities. Features are consistent with a pseudonormal left ventricular filling pattern, with concomitant abnormal relaxation and increased filling pressure (grade 2 diastolic dysfunction).  ------------------------------------------------------------ Aortic valve:  Trileaflet; mildly calcified leaflets. Doppler:  There was no stenosis.  No regurgitation.  ------------------------------------------------------------ Aorta: Aortic root: The aortic root was normal in size. Ascending aorta: The ascending aorta was normal in size.  ------------------------------------------------------------ Mitral valve: Prolapse of posterior mitral leaflet withpartial flail P2 segment . Severe, anteriorly-directed mitral regurgitation. Doppler:  There was no evidence for stenosis.   Peak gradient: 73mm Hg (D).  ------------------------------------------------------------ Left atrium: The atrium was mildly dilated.  ------------------------------------------------------------ Right ventricle: The cavity size was normal. Systolic function was normal.  ------------------------------------------------------------ Pulmonic valve:  Structurally normal valve.  Cusp separation was normal. Doppler: Transvalvular velocity was within the normal range. No regurgitation.  ------------------------------------------------------------ Tricuspid valve:  Doppler:  Trivial regurgitation.  ------------------------------------------------------------ Right atrium: The atrium was normal in size.  ------------------------------------------------------------ Pericardium: There was no pericardial effusion.  ------------------------------------------------------------ Systemic veins: Inferior vena cava: The vessel was normal in size; the respirophasic diameter changes were in the normal range (= 50%); findings are consistent with normal central  venous pressure.  ------------------------------------------------------------  2D measurements    Normal Doppler measurements  Normal Left ventricle         Main pulmonary LVID ED,   35 mm   43-52  artery chord,             Pressure,   36 mm Hg =30 PLAX              S LVID ES,  21.1 mm   23-38  Left ventricle chord,             Ea, lat   6.91 cm/s ------ PLAX              ann, tiss FS, chord,  40 %   >29   DP PLAX              E/Ea, lat 23.44    ------ LVPW, ED  7.75 mm   ------ ann, tiss IVS/LVPW  1.16    <1.3  DP ratio, ED           Ea, med   8.77 cm/s ------ Ventricular septum       ann, tiss IVS, ED    9 mm   ------ DP LVOT              E/Ea, med 18.47    ------ Diam, S   20 mm   ------ ann, tiss Area    3.14 cm^2  ------ DP Diam     20 mm   ------ LVOT Aorta             Peak vel,  125 cm/s ------ Root diam,  29 mm   ------ S ED               VTI, S   21.8 cm  ------ Left atrium          Peak      6 mm Hg ------ AP dim   2.39 cm/m^2 <2.2  gradient, index             S                Stroke vol 68.5 ml  ------  Stroke    42 ml/m^ ------                index      2                Mitral valve                Peak E vel  162 cm/s ------                Peak A vel  138 cm/s ------                Decelerati  254 ms  150-23                on time        0                Peak     10 mm Hg ------                gradient,                D                Peak E/A   1.2    ------                 ratio                Regurg   32.1 cm/s ------                alias vel,                PISA                Regurg    12 mm  ------                radius,                PISA                Max regurg  539 cm/s ------                vel                Regurg VTI  170 cm  ------                ERO, PISA  0.54 cm^2 ------                Regurg    92 ml  ------                vol, PISA                RF, PISA  57.33 %   ------                Tricuspid valve                Regurg    288 cm/s ------                peak vel                Peak RV-RA  33 mm Hg ------                gradient,                S  Systemic veins                Estimated   3 mm Hg ------                CVP                Right ventricle                Sa vel,   15.4 cm/s ------                lat ann,                tiss DP  ------------------------------------------------------------ Prepared and Electronically Authenticated by  Loralie Champagne 2015-04-16T21:[id number]   Transthoracic Echocardiography  Patient: Pharrah, Seelinger MR #: [id number] Study Date: 10/10/2015 Gender: F Age: 60 Height: 162.6 cm Weight: 56.6 kg BSA: 1.6 m^2 Pt. Status: Room:  ATTENDING Jenkins Rouge, M.D. REFERRING Mertie Moores, M.D. SONOGRAPHER Oletta Lamas, Will PERFORMING [facility], Outpatient ORDERING [name], Jr  cc:  ------------------------------------------------------------------- LV EF: 60% -  65%  ------------------------------------------------------------------- Indications: (I34.1).  ------------------------------------------------------------------- History: PMH: Acquired from the patient and from the patient&'s chart. Mitral valve prolapse. Mitral regurgitation. Risk factors: Former tobacco use.  ------------------------------------------------------------------- Study Conclusions  - Left ventricle: The cavity size was normal. Wall thickness was  normal. Systolic function was normal. The estimated ejection  fraction was in the range of 60% to 65%. - Mitral valve: Flail segment of poserior leaflet with severe  eccentric MR. Consider f/u TEE, right and left cath and surgical  referral. - Left atrium: The atrium was mildly dilated. - Atrial septum: No defect or patent foramen ovale was identified.  Transthoracic echocardiography. M-mode, complete 2D, spectral Doppler, and color Doppler. Birthdate: Patient birthdate: 29-Jun-1945. Age: Patient is 70 yr old. Sex: Gender: female. BMI: 21.4 kg/m^2. Blood pressure: 100/66 Patient status: Outpatient. Study date: Study date: 10/10/2015. Study time: 01:01 PM. Location: [place]  -------------------------------------------------------------------  ------------------------------------------------------------------- Left ventricle: The cavity size was normal. Wall thickness was normal. Systolic function was normal. The estimated ejection fraction was in the range of 60% to 65%.  ------------------------------------------------------------------- Aortic valve: Structurally normal valve. Cusp separation was normal. Doppler: Transvalvular velocity was within the normal range. There was no stenosis. There was no regurgitation.  ------------------------------------------------------------------- Aorta: The aorta was normal, not dilated, and  non-diseased.  ------------------------------------------------------------------- Mitral valve: Flail segment of poserior leaflet with severe eccentric MR. Consider f/u TEE, right and left cath and surgical referral. Doppler: Peak gradient (D): 12 mm Hg.  ------------------------------------------------------------------- Left atrium: The atrium was mildly dilated.  ------------------------------------------------------------------- Atrial septum: No defect or patent foramen ovale was identified.  ------------------------------------------------------------------- Right ventricle: The cavity size was normal. Wall thickness was normal. Systolic function was normal.  ------------------------------------------------------------------- Pulmonic valve: Doppler: There was trivial regurgitation.  ------------------------------------------------------------------- Tricuspid valve: Doppler: There was mild regurgitation.  ------------------------------------------------------------------- Right atrium: The atrium was normal in size.  ------------------------------------------------------------------- Pericardium: The pericardium was normal in appearance.  ------------------------------------------------------------------- Post procedure conclusions Ascending Aorta:  - The aorta was normal, not dilated, and non-diseased.  ------------------------------------------------------------------- Measurements  Left ventricle Value Reference LV ID, ED, PLAX chordal (L) 36.7 mm 43 - 52 LV ID, ES, PLAX chordal (L) 19.7 mm 23 - 38 LV fx shortening, PLAX chordal 46 % >=29 LV PW thickness, ED 7.17 mm --------- IVS/LV PW ratio, ED (H) 1.39 <=1.3 Stroke volume, 2D 45 ml --------- Stroke  volume/bsa, 2D 28 ml/m^2 --------- LV ejection fraction, 1-p A4C 64 % ---------  LV end-diastolic volume, 2-p 86 ml --------- LV end-systolic volume, 2-p 29 ml --------- LV ejection fraction, 2-p 66 % --------- Stroke volume, 2-p 57 ml --------- LV end-diastolic volume/bsa, 2-p 54 ml/m^2 --------- LV end-systolic volume/bsa, 2-p 18 ml/m^2 --------- Stroke volume/bsa, 2-p 35.6 ml/m^2 --------- LV e&', lateral 7.31 cm/s --------- LV E/e&', lateral 23.39 --------- LV e&', medial 7.31 cm/s --------- LV E/e&', medial 23.39 --------- LV e&', average 7.31 cm/s --------- LV E/e&', average 23.39 ---------  Ventricular septum Value Reference IVS thickness, ED 9.97 mm ---------  LVOT Value Reference LVOT ID, S 17 mm --------- LVOT area 2.27 cm^2 --------- LVOT ID 17 mm --------- LVOT peak velocity, S 107 cm/s --------- LVOT mean velocity, S 70.2 cm/s --------- LVOT VTI, S 20 cm --------- LVOT peak gradient, S 5 mm Hg --------- Stroke volume (SV), LVOT DP 45.4 ml --------- Stroke index (SV/bsa), LVOT DP 28.4 ml/m^2 ---------  Aorta Value Reference Aortic root ID, ED 27 mm --------- Ascending aorta ID, A-P,  S 28 mm ---------  Left atrium Value Reference LA ID, A-P, ES 39 mm --------- LA ID/bsa, A-P (H) 2.44 cm/m^2 <=2.2 LA volume, S 56 ml --------- LA volume/bsa, S 35 ml/m^2 --------- LA volume, ES, 1-p A4C 70 ml --------- LA volume/bsa, ES, 1-p A4C 43.7 ml/m^2 --------- LA volume, ES, 1-p A2C 39 ml --------- LA volume/bsa, ES, 1-p A2C 24.4 ml/m^2 ---------  Mitral valve Value Reference Mitral E-wave peak velocity 171 cm/s --------- Mitral A-wave peak velocity 122 cm/s --------- Mitral deceleration time (H) 313 ms 150 - 230 Mitral peak gradient, D 12 mm Hg --------- Mitral E/A ratio, peak 1.4 ---------  Pulmonary arteries Value Reference PA pressure, S, DP 23 mm Hg <=30  Tricuspid valve Value Reference Tricuspid regurg peak velocity 224 cm/s --------- Tricuspid peak RV-RA gradient 20 mm Hg ---------  Systemic veins Value Reference Estimated CVP 3 mm Hg ---------  Right ventricle Value Reference RV pressure, S, DP 23 mm Hg <=30 RV s&', lateral, S 13.8 cm/s ---------  Legend: (L) and (H) mark values outside specified reference range.  ------------------------------------------------------------------- Prepared and Electronically Authenticated by  Jenkins Rouge,  M.D. 2017-04-24T13:51:28   Transesophageal Echocardiography  Patient: Soma, Gentle MR #: [id number] Study Date: 10/14/2015 Gender: F Age: 20 Height: 162.6 cm Weight: 56.8 kg BSA: 1.6 m^2 Pt. Status: Room:  ATTENDING Mertie Moores, M.D. PERFORMING Mertie Moores, M.D. SONOGRAPHER Johny Chess, RDCS, CCT ADMITTING [name], Peggyann Juba [name], Jr  cc:  -------------------------------------------------------------------  ------------------------------------------------------------------- Indications: Mitral regurgitation 424.0.  ------------------------------------------------------------------- Study Conclusions  - Left ventricle: The cavity size was normal. Wall thickness was  normal. Systolic function was normal. - Aortic valve: No evidence of vegetation. - Mitral valve: Severe prolapse, involving the posterior leaflet.  There was moderate to severe regurgitation. - Left atrium: No evidence of thrombus in the atrial cavity or  appendage. - Tricuspid valve: No evidence of vegetation. - Pulmonic valve: No evidence of vegetation.  Diagnostic transesophageal echocardiography. 2D and color Doppler. Birthdate: Patient birthdate: Feb 04, 1946. Age: Patient is 70 yr old. Sex: Gender: female. BMI: 21.5 kg/m^2. Blood pressure:  115/55 Patient status: Outpatient. Study date: Study date: 10/14/2015. Study time: 08:56 AM. Location: Endoscopy.  -------------------------------------------------------------------  ------------------------------------------------------------------- Left ventricle: The cavity size was normal. Wall thickness was normal. Systolic function was normal.  ------------------------------------------------------------------- Aortic valve: Structurally normal valve. Cusp separation was normal. No evidence of vegetation. Doppler: There was  no regurgitation.  ------------------------------------------------------------------- Mitral valve: There was an eccentric jet of MR that appeared to wrap around  the left atrium. There was no evidence of flow reversal in the pulmonary veins. Severe prolapse, involving the posterior leaflet. Doppler: There was no evidence for stenosis. There was moderate to severe regurgitation.  ------------------------------------------------------------------- Left atrium: No evidence of thrombus in the atrial cavity or appendage.  ------------------------------------------------------------------- Pulmonic valve: Structurally normal valve. Cusp separation was normal. No evidence of vegetation.  ------------------------------------------------------------------- Tricuspid valve: Structurally normal valve. Leaflet separation was normal. No evidence of vegetation. Doppler: There was trivial regurgitation.  ------------------------------------------------------------------- Prepared and Electronically Authenticated by  Mertie Moores, M.D. 2017-04-28T18:21:07  CARDIAC CATHETERIZATION         Procedures      Right/Left Heart Cath and Coronary Angiography        Conclusion       There is hyperdynamic left ventricular systolic function.  1. Patent coronary arteries, right-dominant, with no obstructive CAD 2. Vigorous LV systolic function without regional wall motion abnormalities 3. Severe mitral regurgitation by angiographic and hemodynamic assessment 4. Normal right-sided heart pressures         Indications      Severe mitral regurgitation [I34.0 (ICD-10-CM)]        Technique and Indications      INDICATION: Severe mitral regurgitation, preoperative study for mitral valve repair  PROCEDURAL DETAILS: There was an indwelling IV in a right antecubital vein. Using normal sterile technique, the IV was changed out for a 5 Fr brachial sheath over a  0.018 inch wire. The right wrist was then prepped, draped, and anesthetized with 1% lidocaine. Using the modified Seldinger technique a 5/6 French Slender sheath was placed in the right radial artery. Intra-arterial verapamil was administered through the radial artery sheath. IV heparin was administered after a JR4 catheter was advanced into the central aorta. A Swan-Ganz catheter was used for the right heart catheterization. Standard protocol was followed for recording of right heart pressures and sampling of oxygen saturations. Fick cardiac output was calculated. Standard Judkins catheters were used for selective coronary angiography and left ventriculography. There were no immediate procedural complications. The patient was transferred to the post catheterization recovery area for further monitoring.  During this procedure the patient is administered a total of Versed 2 mg and Fentanyl 50 mg to achieve and maintain moderate conscious sedation. The patient's heart rate, blood pressure, and oxygen saturation are monitored continuously during the procedure. The period of conscious sedation is 39 minutes, of which I was present face-to-face 100% of this time.  Estimated blood loss <50 mL. There were no immediate complications during the procedure.        Coronary Findings      Dominance: Right     Left Anterior Descending  . Vessel is angiographically normal.       Left Circumflex  . Vessel is angiographically normal.       Right Coronary Artery  . Vessel is angiographically normal.           Right Heart Pressures LV EDP is normal. Normal right heart pressures, except for large V waves in the PCWP tracing, consistent with the patient's known severe mitral regurgitation.        Wall Motion                      Left Heart      Left Ventricle The left ventricular size is normal. There is hyperdynamic left ventricular systolic function. The left ventricular ejection fraction  is greater tha 65% by visual estimate. There are no wall  motion abnormalities in the left ventricle.     Mitral Valve There is severe (4+) mitral regurgitation.        Coronary Diagrams         Diagnostic Diagram              Implants       No implant documentation for this case.           PACS Images    Show images for Cardiac catheterization        Link to Procedure Log    Procedure Log        Hemo Data         Most Recent Value     Fick Cardiac Output  5.42 L/min     Fick Cardiac Output Index  3.35 (L/min)/BSA     RA A Wave  5 mmHg     RA V Wave  3 mmHg     RA Mean  1 mmHg     RV Systolic Pressure  25 mmHg     RV Diastolic Pressure  0 mmHg     RV EDP  4 mmHg     PA Systolic Pressure  24 mmHg     PA Diastolic Pressure  8 mmHg     PA Mean  18 mmHg     PW A Wave  16 mmHg     PW V Wave  24 mmHg     PW Mean  13 mmHg     AO Systolic Pressure  [id number] mmHg     AO Diastolic Pressure  59 mmHg     AO Mean  79 mmHg     LV Systolic Pressure  123456 mmHg     LV Diastolic Pressure  0 mmHg     LV EDP  15 mmHg     Arterial Occlusion Pressure Extended Systolic Pressure  123456 mmHg     Arterial Occlusion Pressure Extended Diastolic Pressure  61 mmHg     Arterial Occlusion Pressure Extended Mean Pressure  86 mmHg     Left Ventricular Apex Extended Systolic Pressure  [id number] mmHg     Left Ventricular Apex Extended Diastolic Pressure  0 mmHg     Left Ventricular Apex Extended EDP Pressure  15 mmHg     QP/QS  0.83     TPVR Index  6.46 HRUI     TSVR Index  23.63 HRUI     PVR SVR Ratio  0.08     TPVR/TSVR Ratio  0.27      CT ANGIOGRAPHY CHEST, ABDOMEN AND PELVIS  TECHNIQUE: Multidetector CT imaging through the chest, abdomen and pelvis was performed using the standard protocol during bolus administration of intravenous contrast. Multiplanar reconstructed images and MIPs were obtained and reviewed to  evaluate the vascular anatomy.  CONTRAST: 75 cc Isovue 370 IV  COMPARISON: None.  FINDINGS: CTA CHEST FINDINGS  Cardiovascular: Heart is normal size. Aorta is normal caliber. No evidence of dissection. Scattered calcifications in the aortic arch. No visible coronary artery calcifications.  Mediastinum/Nodes: No mediastinal, hilar, or axillary adenopathy.  Lungs/Pleura: Lungs are clear. No focal airspace opacities or suspicious nodules. No effusions.  Musculoskeletal: No acute bony abnormality.  Review of the MIP images confirms the above findings.  CTA ABDOMEN AND PELVIS FINDINGS  Hepatobiliary: Prior cholecystectomy. No focal hepatic abnormality or biliary ductal dilatation.  Pancreas: No focal abnormality or ductal dilatation.  Spleen: No focal abnormality. Normal size.  Adrenals/Urinary Tract: No adrenal abnormality.  No focal renal abnormality. No stones or hydronephrosis. Urinary bladder is unremarkable.  Stomach/Bowel: Stomach, large and small bowel grossly unremarkable.  Vascular/Lymphatic: Aorta is normal caliber. No dissection. Scattered calcifications in the distal aorta and proximal right common iliac artery. Iliofemoral vessels are widely patent. No adenopathy.  Reproductive: No visible focal abnormality. Mild prominence of the prostate.  Other: No free fluid or free air.  Musculoskeletal: No acute bony abnormality or focal bone lesion.  Review of the MIP images confirms the above findings.  IMPRESSION: No evidence of aortic aneurysm or dissection.  No acute findings in the chest, abdomen or pelvis.  Scattered aortic atherosclerosis in the aortic arch and distal abdominal aorta.   Electronically Signed  By: Rolm Baptise M.D.  On: 12/23/2015 09:34   Impression:  Patient has stage D severe symptomatic primary mitral regurgitation. She has recently developed symptoms of exertional shortness of breath that only  occur with more strenuous physical exertion. She is quite active physically, but she has definitely noticed a change in her exercise tolerance. I have previously reviewed the patient's transthoracic and transesophageal echocardiograms. She has mitral valve prolapse with an obvious flail segment of the middle scallop of the posterior leaflet. 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 There is an eccentric jet of mitral regurgitation that courses anteriorly around the left atrium with severe mitral regurgitation. There are no other complicating features. I feel that there is a very high likelihood that her valve should be repairable with low operative risks. Diagnostic cardiac catheterization is notable for the absence of significant coronary artery disease. CT angiography reveals adequate vascular access to facilitate peripheral cannulation for surgery.   Plan:  The patient and her family were again counseled at length regarding the indications, risks and potential benefits of mitral valve repair.  The rationale for elective surgery has been explained, including a comparison between surgery and continued medical therapy with close follow-up.  The likelihood of successful and durable valve repair has been discussed with particular reference to the findings of their recent echocardiogram.  Based upon these findings and previous experience, I have quoted them a greater than 95 percent likelihood of successful valve repair.  In the unlikely event that their valve cannot be successfully repaired, we discussed the possibility of replacing the mitral valve using a mechanical prosthesis with the attendant need for long-term anticoagulation versus the alternative of replacing it using a bioprosthetic tissue valve with its potential for late structural valve deterioration and failure, depending upon the patient's longevity.  The patient specifically requests that if the mitral valve must be replaced that it be  done using a bioprosthetic tissue valve.   The patient understands and accepts all potential risks of surgery including but not limited to risk of death, stroke or other neurologic complication, myocardial infarction, congestive heart failure, respiratory failure, renal failure, bleeding requiring transfusion and/or reexploration, arrhythmia, infection or other wound complications, pneumonia, pleural and/or pericardial effusion, pulmonary embolus, aortic dissection or other major vascular complication, or delayed complications related to valve repair or replacement including but not limited to structural valve deterioration and failure, thrombosis, embolization, endocarditis, or paravalvular leak.  Alternative surgical approaches have been discussed including a comparison between conventional sternotomy and minimally-invasive techniques.  The relative risks and benefits of each have been reviewed as they pertain to the patient's specific circumstances, and all of their questions have been addressed.  Specific risks potentially related to the minimally-invasive approach were discussed at length, including but not limited to risk of  conversion to full or partial sternotomy, aortic dissection or other major vascular complication, unilateral acute lung injury or pulmonary edema, phrenic nerve dysfunction or paralysis, rib fracture, chronic pain, lung hernia, or lymphocele.  Expectations for the patient's postoperative recovery has been discussed. All of their questions have been answered.     Valentina Gu. Roxy Manns, MD 01/30/2016 3:50 PM

## 2016-02-01 NOTE — Transfer of Care (Signed)
Immediate Anesthesia Transfer of Care Note  Patient: Destiny Andersen [id number]  Procedure(s) Performed: Procedure(s): MINIMALLY INVASIVE MITRAL VALVE REPAIR (MVR) with closure of PFO (Right) TRANSESOPHAGEAL ECHOCARDIOGRAM (TEE) (N/A) PATENT FORAMEN OVALE CLOSURE (N/A)  Patient Location: SICU  Anesthesia Type:General  Level of Consciousness: sedated and Patient remains intubated per anesthesia plan  Airway & Oxygen Therapy: Patient remains intubated per anesthesia plan and Patient placed on Ventilator (see vital sign flow sheet for setting)  Post-op Assessment: Report given to RN and Post -op Vital signs reviewed and stable  Post vital signs: Reviewed and stable  Last Vitals:  Vitals:   02/01/16 0741  BP: 132/64  Pulse: 81  Resp: 20  Temp: 36.5 C    Last Pain:  Vitals:   02/01/16 0741  TempSrc: Oral         Complications: No apparent anesthesia complications

## 2016-02-02 ENCOUNTER — Encounter (HOSPITAL_COMMUNITY): Payer: Self-pay | Admitting: Thoracic Surgery (Cardiothoracic Vascular Surgery)

## 2016-02-02 ENCOUNTER — Inpatient Hospital Stay (HOSPITAL_COMMUNITY): Payer: Medicare Other

## 2016-02-02 LAB — POCT I-STAT 3, ART BLOOD GAS (G3+)
ACID-BASE DEFICIT: 3 mmol/L — AB (ref 0.0–2.0)
ACID-BASE EXCESS: 2 mmol/L (ref 0.0–2.0)
Bicarbonate: 24.8 mEq/L — ABNORMAL HIGH (ref 20.0–24.0)
Bicarbonate: 26.8 mEq/L — ABNORMAL HIGH (ref 20.0–24.0)
Bicarbonate: 22.5 mEq/L (ref 20.0–24.0)
O2 SAT: 98 %
O2 SAT: 98 %
O2 Saturation: 97 %
PCO2 ART: 40.9 mmHg (ref 35.0–45.0)
PO2 ART: 96 mmHg (ref 80.0–100.0)
Patient temperature: 36.3
TCO2: 26 mmol/L (ref 0–100)
TCO2: 28 mmol/L (ref 0–100)
TCO2: 24 mmol/L (ref 0–100)
pCO2 arterial: 40.5 mmHg (ref 35.0–45.0)
pCO2 arterial: 40.7 mmHg (ref 35.0–45.0)
pH, Arterial: 7.388 (ref 7.350–7.450)
pH, Arterial: 7.428 (ref 7.350–7.450)
pH, Arterial: 7.35 (ref 7.350–7.450)
pO2, Arterial: 110 mmHg — ABNORMAL HIGH (ref 80.0–100.0)
pO2, Arterial: 91 mmHg (ref 80.0–100.0)

## 2016-02-02 LAB — CBC
HEMATOCRIT: 29.6 % — AB (ref 36.0–46.0)
HEMATOCRIT: 28.2 % — AB (ref 36.0–46.0)
HEMOGLOBIN: 9.8 g/dL — AB (ref 12.0–15.0)
Hemoglobin: 9.2 g/dL — ABNORMAL LOW (ref 12.0–15.0)
MCH: 30.8 pg (ref 26.0–34.0)
MCH: 30.9 pg (ref 26.0–34.0)
MCHC: 33.1 g/dL (ref 30.0–36.0)
MCHC: 32.6 g/dL (ref 30.0–36.0)
MCV: 93.1 fL (ref 78.0–100.0)
MCV: 94.6 fL (ref 78.0–100.0)
Platelets: 161 10*3/uL (ref 150–400)
Platelets: 123 10*3/uL — ABNORMAL LOW (ref 150–400)
RBC: 3.18 MIL/uL — ABNORMAL LOW (ref 3.87–5.11)
RBC: 2.98 MIL/uL — ABNORMAL LOW (ref 3.87–5.11)
RDW: 17 % — ABNORMAL HIGH (ref 11.5–15.5)
RDW: 17.3 % — AB (ref 11.5–15.5)
WBC: 7.5 10*3/uL (ref 4.0–10.5)
WBC: 8.7 10*3/uL (ref 4.0–10.5)

## 2016-02-02 LAB — MAGNESIUM
Magnesium: 2.4 mg/dL (ref 1.7–2.4)
Magnesium: 2.2 mg/dL (ref 1.7–2.4)

## 2016-02-02 LAB — PREPARE FRESH FROZEN PLASMA
UNIT DIVISION: 0
Unit division: 0

## 2016-02-02 LAB — GLUCOSE, CAPILLARY
GLUCOSE-CAPILLARY: 102 mg/dL — AB (ref 65–99)
GLUCOSE-CAPILLARY: 104 mg/dL — AB (ref 65–99)
GLUCOSE-CAPILLARY: 121 mg/dL — AB (ref 65–99)
GLUCOSE-CAPILLARY: 136 mg/dL — AB (ref 65–99)
GLUCOSE-CAPILLARY: 138 mg/dL — AB (ref 65–99)
GLUCOSE-CAPILLARY: 85 mg/dL (ref 65–99)
GLUCOSE-CAPILLARY: 90 mg/dL (ref 65–99)
GLUCOSE-CAPILLARY: 93 mg/dL (ref 65–99)
Glucose-Capillary: 104 mg/dL — ABNORMAL HIGH (ref 65–99)
Glucose-Capillary: 118 mg/dL — ABNORMAL HIGH (ref 65–99)
Glucose-Capillary: 155 mg/dL — ABNORMAL HIGH (ref 65–99)
Glucose-Capillary: 168 mg/dL — ABNORMAL HIGH (ref 65–99)
Glucose-Capillary: 171 mg/dL — ABNORMAL HIGH (ref 65–99)

## 2016-02-02 LAB — BASIC METABOLIC PANEL
ANION GAP: 7 (ref 5–15)
BUN: 9 mg/dL (ref 6–20)
CHLORIDE: 112 mmol/L — AB (ref 101–111)
CO2: 21 mmol/L — AB (ref 22–32)
Calcium: 6.9 mg/dL — ABNORMAL LOW (ref 8.9–10.3)
Creatinine, Ser: 0.78 mg/dL (ref 0.44–1.00)
GFR calc non Af Amer: >60 mL/min (ref >60)
GLUCOSE: 102 mg/dL — AB (ref 65–99)
Potassium: 3.1 mmol/L — ABNORMAL LOW (ref 3.5–5.1)
Sodium: 140 mmol/L (ref 135–145)

## 2016-02-02 LAB — PREPARE PLATELET PHERESIS
Unit division: 0
Unit division: 0

## 2016-02-02 LAB — CREATININE, SERUM
Creatinine, Ser: 0.95 mg/dL (ref 0.44–1.00)
GFR calc non Af Amer: 59 mL/min — ABNORMAL LOW (ref >60)

## 2016-02-02 LAB — POCT I-STAT, CHEM 8
BUN: 11 mg/dL (ref 6–20)
CALCIUM ION: 1 mmol/L — AB (ref 1.12–1.23)
CHLORIDE: 101 mmol/L (ref 101–111)
Creatinine, Ser: 0.9 mg/dL (ref 0.44–1.00)
Glucose, Bld: 126 mg/dL — ABNORMAL HIGH (ref 65–99)
HEMATOCRIT: 28 % — AB (ref 36.0–46.0)
Hemoglobin: 9.5 g/dL — ABNORMAL LOW (ref 12.0–15.0)
Potassium: 3.6 mmol/L (ref 3.5–5.1)
SODIUM: 141 mmol/L (ref 135–145)
TCO2: 24 mmol/L (ref 0–100)

## 2016-02-02 MED ORDER — INSULIN ASPART 100 UNIT/ML ~~LOC~~ SOLN
0.0000 [IU] | SUBCUTANEOUS | Status: DC
  Administered 2016-02-02: 2 [IU] via SUBCUTANEOUS

## 2016-02-02 MED ORDER — POTASSIUM CHLORIDE 10 MEQ/50ML IV SOLN
10.0000 meq | INTRAVENOUS | Status: AC
Start: 2016-02-02 — End: 2016-02-02
  Administered 2016-02-02 (×3): 10 meq via INTRAVENOUS
  Filled 2016-02-02: qty 50

## 2016-02-02 MED ORDER — WARFARIN - PHYSICIAN DOSING INPATIENT: Freq: Every day | Status: DC

## 2016-02-02 MED ORDER — POTASSIUM CHLORIDE 10 MEQ/50ML IV SOLN
10.0000 meq | INTRAVENOUS | Status: AC
  Administered 2016-02-02 (×3): 10 meq via INTRAVENOUS
  Filled 2016-02-02 (×3): qty 50

## 2016-02-02 MED ORDER — FUROSEMIDE 10 MG/ML IJ SOLN
20.0000 mg | Freq: Four times a day (QID) | INTRAMUSCULAR | Status: AC
  Administered 2016-02-02 (×3): 20 mg via INTRAVENOUS
  Filled 2016-02-02 (×3): qty 2

## 2016-02-02 MED ORDER — WARFARIN SODIUM 2.5 MG PO TABS
2.5000 mg | ORAL_TABLET | Freq: Every day | ORAL | Status: DC
  Administered 2016-02-02 – 2016-02-06 (×5): 2.5 mg via ORAL
  Filled 2016-02-02 (×5): qty 1

## 2016-02-02 MED ORDER — MORPHINE SULFATE (PF) 2 MG/ML IV SOLN
1.0000 mg | INTRAVENOUS | Status: DC | PRN
  Administered 2016-02-02 – 2016-02-03 (×5): 2 mg via INTRAVENOUS
  Filled 2016-02-02 (×5): qty 1

## 2016-02-02 MED ORDER — INSULIN ASPART 100 UNIT/ML ~~LOC~~ SOLN
0.0000 [IU] | SUBCUTANEOUS | Status: DC
  Administered 2016-02-02 (×2): 2 [IU] via SUBCUTANEOUS

## 2016-02-02 MED FILL — Heparin Sodium (Porcine) Inj 1000 Unit/ML: INTRAMUSCULAR | Qty: 10 | Status: AC

## 2016-02-02 MED FILL — Sodium Chloride IV Soln 0.9%: INTRAVENOUS | Qty: 2000 | Status: AC

## 2016-02-02 MED FILL — Lidocaine HCl IV Inj 20 MG/ML: INTRAVENOUS | Qty: 5 | Status: AC

## 2016-02-02 MED FILL — Sodium Bicarbonate IV Soln 8.4%: INTRAVENOUS | Qty: 50 | Status: AC

## 2016-02-02 MED FILL — Electrolyte-R (PH 7.4) Solution: INTRAVENOUS | Qty: 6000 | Status: AC

## 2016-02-02 MED FILL — Mannitol IV Soln 20%: INTRAVENOUS | Qty: 500 | Status: AC

## 2016-02-02 MED FILL — Albumin, Human Inj 5%: INTRAVENOUS | Qty: 500 | Status: AC

## 2016-02-02 MED FILL — Heparin Sodium (Porcine) Inj 1000 Unit/ML: INTRAMUSCULAR | Qty: 2500 | Status: AC

## 2016-02-02 NOTE — Addendum Note (Signed)
Addendum  created 02/02/16 1048 by Fidela Juneau, CRNA   Anesthesia Intra Flowsheets edited

## 2016-02-02 NOTE — Procedures (Signed)
Upon attempting to obtain respiratory mechanics to determine readiness for extubation, pt's WOB increased and RR increased to 42.  Weaning terminated and pt placed on full support.

## 2016-02-02 NOTE — Care Management Note (Signed)
Case Management Note  Patient Details  Name: Destiny Andersen MRN: [id number] Date of Birth: 1946/05/12  Subjective/Objective:    S/p S/P Procedure(s) (LRB): MINIMALLY INVASIVE MITRAL VALVE REPAIR (MVR) with closure of PFO (Right) TRANSESOPHAGEAL ECHOCARDIOGRAM (TEE) (N/A) PATENT FORAMEN OVALE CLOSURE (N/A)              Action/Plan:  PTA independent from home with husband.  CM will continue to follow for discharge needs   Expected Discharge Date:                  Expected Discharge Plan:  Home/Self Care  In-House Referral:     Discharge planning Services  CM Consult  Post Acute Care Choice:    Choice offered to:     DME Arranged:    DME Agency:     HH Arranged:    HH Agency:     Status of Service:  In process, will continue to follow  If discussed at Long Length of Stay Meetings, dates discussed:    Additional Comments:  Maryclare Labrador, RN 02/02/2016, 2:09 PM

## 2016-02-02 NOTE — Progress Notes (Signed)
[address]       [place] [address]             [phone]        CARDIOTHORACIC SURGERY PROGRESS NOTE   R1 Day Post-Op Procedure(s) (LRB): MINIMALLY INVASIVE MITRAL VALVE REPAIR (MVR) with closure of PFO (Right) TRANSESOPHAGEAL ECHOCARDIOGRAM (TEE) (N/A) PATENT FORAMEN OVALE CLOSURE (N/A)  Subjective: Looks good.  Expected soreness across chest.  No SOB.  Feels tired and swollen.  Objective: Vital signs: BP Readings from Last 1 Encounters:  02/02/16 95/63   Pulse Readings from Last 1 Encounters:  02/02/16 70   Resp Readings from Last 1 Encounters:  02/02/16 12   Temp Readings from Last 1 Encounters:  02/02/16 97.9 F (36.6 C) (Oral)    Hemodynamics: PAP: (22-34)/(9-22) 27/11 CO:  [2.3 L/min-4.2 L/min] 3.1 L/min CI:  [1.4 L/min/m2-2.6 L/min/m2] 2 L/min/m2  Physical Exam:  Rhythm:   Junctional w/ HR 30-40 - AAI pacing  Breath sounds: clear  Heart sounds:  RRR w/out murmur  Incisions:  Dressings dry, intact  Abdomen:  Soft, non-distended, non-tender  Extremities:  Warm, well-perfused  Chest tubes:  Decreasing volume thin serosanguinous output, no air leak    Intake/Output from previous day: 08/16 0701 - 08/17 0700 In: 8797.3 [I.V.:4651.9; Blood:2375.3; IV Piggyback:1770] Out: 6410 [Urine:4480; Blood:1300; Chest Tube:630] Intake/Output this shift: Total I/O In: 50 [IV Piggyback:50] Out: 50 [Chest Tube:50]  Lab Results:  CBC: Recent Labs  02/01/16 2200 02/01/16 2202 02/02/16 0500  WBC 7.3  --  7.5  HGB 10.1* 10.2* 9.8*  HCT 30.6* 30.0* 29.6*  PLT 141*  --  161    BMET:  Recent Labs  01/30/16 1311  02/01/16 2202 02/02/16 0500  NA 136  < > 143 140  K 4.1  < > 4.6 3.1*  CL 108  < > 109 112*  CO2 22  --   --  21*  GLUCOSE 97  < > 150* 102*  BUN 14  < > 13 9  CREATININE 1.01*  < > 0.70 0.78  CALCIUM 9.6  --   --  6.9*  < > = values in this interval not displayed.   PT/INR:   Recent Labs  02/01/16 1637    LABPROT 26.5*  INR 2.39    CBG (last 3)   Recent Labs  02/02/16 0550 02/02/16 0653 02/02/16 0843  GLUCAP 90 93 104*    ABG    Component Value Date/Time   PHART 7.350 02/02/2016 0505   PCO2ART 40.7 02/02/2016 0505   PO2ART 91.0 02/02/2016 0505   HCO3 22.5 02/02/2016 0505   TCO2 24 02/02/2016 0505   ACIDBASEDEF 3.0 (H) 02/02/2016 0505   O2SAT 97.0 02/02/2016 0505    CXR: PORTABLE CHEST 1 VIEW  COMPARISON:  02/01/2016, 02/01/2016, 01/30/2016.  CT 12/23/2015.  FINDINGS: Interim removal of endotracheal tube and NG tube. Swan-Ganz catheter and right chest tube in stable position. Prior cardiac valve replacement. Partial clearing of right lung infiltrate and/or edema. Low lung volumes with bibasilar atelectasis. Tiny bilateral pleural effusions cannot be excluded. No pneumothorax.  IMPRESSION: 1. Interim removal of endotracheal tube and NG tube. Swan-Ganz catheter right chest tube in stable position.  2. Slight clearing of right lung infiltrate/edema. Persistent bibasilar atelectasis. Small bilateral pleural effusions cannot be excluded.  3. Prior cardiac valve replacement . Heart size stable.   Electronically Signed   By: Marcello Moores  Register   On: 02/02/2016 07:22   Assessment/Plan: S/P  Procedure(s) (LRB): MINIMALLY INVASIVE MITRAL VALVE REPAIR (MVR) with closure of PFO (Right) TRANSESOPHAGEAL ECHOCARDIOGRAM (TEE) (N/A) PATENT FORAMEN OVALE CLOSURE (N/A)  Overall doing well POD1 Maintaining AAI paced rhythm w/ stable hemodynamics, no drips Breathing comfortably w/ O2 sats 98-100% on 2 L/min via [place] Expected post op acute blood loss anemia, Hgb stable 9.8 this morning Expected post op volume excess, moderate Expected post op atelectasis, mild, R>L   Mobilize  Diuresis  Leave chest tubes in for now  Hold beta blockers and continue AAI pacing for now  Start coumadin slowly   Rexene Alberts, MD 02/02/2016 8:58 AM

## 2016-02-02 NOTE — Procedures (Signed)
Extubation Procedure Note  Patient Details:   Name: Destiny Andersen [id number] DOB: October 13, 1945 MRN: [id number]   Airway Documentation:  Airway 35 mm (Active)  Secured at (cm) 21 cm 02/01/2016  8:03 PM  Measured From Lips 02/01/2016  8:03 PM  Secured Location Right 02/01/2016  8:03 PM  Secured By Pink Tape 02/01/2016  8:03 PM  Site Condition Dry 02/01/2016  8:03 PM    Evaluation  O2 sats: stable throughout Complications: No apparent complications Patient did tolerate procedure well. Bilateral Breath Sounds: Diminished   Yes  Pt extubated per SICU cardiac wean protocol.  NIF (-20) VC (777mL) with good effort.  Pt had + cuff leak.    Nelia Shi M 02/02/2016, 4:18 AM

## 2016-02-02 NOTE — Anesthesia Postprocedure Evaluation (Signed)
Anesthesia Post Note  Patient: Destiny Andersen [id number]  Procedure(s) Performed: Procedure(s) (LRB): MINIMALLY INVASIVE MITRAL VALVE REPAIR (MVR) with closure of PFO (Right) TRANSESOPHAGEAL ECHOCARDIOGRAM (TEE) (N/A) PATENT FORAMEN OVALE CLOSURE (N/A)  Patient location during evaluation: SICU Anesthesia Type: General Level of consciousness: awake and alert, patient cooperative and oriented Pain management: pain level controlled Vital Signs Assessment: post-procedure vital signs reviewed and stable Respiratory status: spontaneous breathing, nonlabored ventilation, respiratory function stable and patient connected to nasal cannula oxygen Cardiovascular status: blood pressure returned to baseline and stable Postop Assessment: no signs of nausea or vomiting Anesthetic complications: no Comments: Pt c/o of mild sore throat and shoulder pain, no TMJ discomfort, nausea, or recall.      Last Vitals:  Vitals:   02/02/16 0845 02/02/16 0900  BP:  (!) 101/59  Pulse:  70  Resp:  (!) 28  Temp: 36.6 C     Last Pain:  Vitals:   02/02/16 0900  TempSrc:   PainSc: 0-No pain                 Gerilyn Stargell,E. Adriyana Greenbaum

## 2016-02-02 NOTE — Progress Notes (Signed)
[facility] BRIEF SICU PROGRESS NOTE  1 Day Post-Op  S/P Procedure(s) (LRB): MINIMALLY INVASIVE MITRAL VALVE REPAIR (MVR) with closure of PFO (Right) TRANSESOPHAGEAL ECHOCARDIOGRAM (TEE) (N/A) PATENT FORAMEN OVALE CLOSURE (N/A)   Stable day.  Feels better. AAI paced w/ stable BP Breathing comfortably w/ O2 sats 94% on RA UOP excellent - diuresing well Labs okay  Plan: Continue current plan  Rexene Alberts, MD 02/02/2016 6:17 PM

## 2016-02-03 ENCOUNTER — Inpatient Hospital Stay (HOSPITAL_COMMUNITY): Payer: Medicare Other

## 2016-02-03 LAB — GLUCOSE, CAPILLARY: GLUCOSE-CAPILLARY: 96 mg/dL (ref 65–99)

## 2016-02-03 LAB — CBC
HCT: 26.4 % — ABNORMAL LOW (ref 36.0–46.0)
HEMOGLOBIN: 9.1 g/dL — AB (ref 12.0–15.0)
MCH: 31.9 pg (ref 26.0–34.0)
MCHC: 34.5 g/dL (ref 30.0–36.0)
MCV: 92.6 fL (ref 78.0–100.0)
PLATELETS: 117 10*3/uL — AB (ref 150–400)
RBC: 2.85 MIL/uL — AB (ref 3.87–5.11)
RDW: 16.8 % — ABNORMAL HIGH (ref 11.5–15.5)
WBC: 8.5 10*3/uL (ref 4.0–10.5)

## 2016-02-03 LAB — BASIC METABOLIC PANEL
ANION GAP: 6 (ref 5–15)
BUN: 13 mg/dL (ref 6–20)
CALCIUM: 7.2 mg/dL — AB (ref 8.9–10.3)
CO2: 25 mmol/L (ref 22–32)
CREATININE: 1.07 mg/dL — AB (ref 0.44–1.00)
Chloride: 104 mmol/L (ref 101–111)
GFR, EST AFRICAN AMERICAN: 59 mL/min — AB (ref >60)
GFR, EST NON AFRICAN AMERICAN: 51 mL/min — AB (ref >60)
Glucose, Bld: 95 mg/dL (ref 65–99)
Potassium: 3.7 mmol/L (ref 3.5–5.1)
SODIUM: 135 mmol/L (ref 135–145)

## 2016-02-03 LAB — PROTIME-INR
INR: 1.4
PROTHROMBIN TIME: 17.3 s — AB (ref 11.4–15.2)

## 2016-02-03 MED ORDER — SODIUM CHLORIDE 0.9% FLUSH
3.0000 mL | Freq: Two times a day (BID) | INTRAVENOUS | Status: DC
  Administered 2016-02-03 – 2016-02-06 (×8): 3 mL via INTRAVENOUS

## 2016-02-03 MED ORDER — POTASSIUM CHLORIDE CRYS ER 20 MEQ PO TBCR
20.0000 meq | EXTENDED_RELEASE_TABLET | Freq: Every day | ORAL | Status: DC
  Filled 2016-02-03: qty 1

## 2016-02-03 MED ORDER — SODIUM CHLORIDE 0.9% FLUSH
3.0000 mL | INTRAVENOUS | Status: DC | PRN
  Administered 2016-02-04: 3 mL via INTRAVENOUS
  Filled 2016-02-03: qty 3

## 2016-02-03 MED ORDER — FUROSEMIDE 10 MG/ML IJ SOLN
20.0000 mg | Freq: Four times a day (QID) | INTRAMUSCULAR | Status: AC
  Administered 2016-02-03 (×3): 20 mg via INTRAVENOUS
  Filled 2016-02-03 (×3): qty 2

## 2016-02-03 MED ORDER — SODIUM CHLORIDE 0.9 % IV SOLN: 250.0000 mL | INTRAVENOUS | Status: DC | PRN

## 2016-02-03 MED ORDER — ACETAMINOPHEN 160 MG/5ML PO SOLN
1000.0000 mg | Freq: Four times a day (QID) | ORAL | Status: DC
  Administered 2016-02-03 – 2016-02-07 (×11): 1000 mg via ORAL
  Filled 2016-02-03 (×14): qty 40.6

## 2016-02-03 MED ORDER — MOVING RIGHT ALONG BOOK
Freq: Once | Status: AC
  Administered 2016-02-03: 09:00:00
  Filled 2016-02-03: qty 1

## 2016-02-03 MED ORDER — POTASSIUM CHLORIDE 10 MEQ/50ML IV SOLN
10.0000 meq | INTRAVENOUS | Status: AC
Start: 2016-02-03 — End: 2016-02-03
  Administered 2016-02-03 (×3): 10 meq via INTRAVENOUS
  Filled 2016-02-03 (×3): qty 50

## 2016-02-03 MED ORDER — ASPIRIN 325 MG PO TABS: 325.0000 mg | ORAL_TABLET | Freq: Every day | ORAL | Status: DC

## 2016-02-03 MED ORDER — FUROSEMIDE 40 MG PO TABS
40.0000 mg | ORAL_TABLET | Freq: Every day | ORAL | Status: DC
  Administered 2016-02-04 – 2016-02-07 (×4): 40 mg via ORAL
  Filled 2016-02-03 (×4): qty 1

## 2016-02-03 MED ORDER — POTASSIUM CHLORIDE 10 MEQ/50ML IV SOLN
10.0000 meq | INTRAVENOUS | Status: AC
  Administered 2016-02-03 (×2): 10 meq via INTRAVENOUS

## 2016-02-03 NOTE — Progress Notes (Signed)
      [address]       [place] [address]             [phone]      POD # 2 min invasive mitral valve  BP 100/60 (BP Location: Right Arm)   Pulse 69   Temp 98.5 F (36.9 C) (Oral)   Resp (!) 23   Ht 5\' 4"  (1.626 m)   Wt 141 lb 15.6 oz (64.4 kg)   SpO2 90%   BMI 24.37 kg/m    Intake/Output Summary (Last 24 hours) at 02/03/16 1705 Last data filed at 02/03/16 1600  Gross per 24 hour  Intake              883 ml  Output             3840 ml  Net            -2957 ml    Awaiting bed on [place] C. Roxan Hockey, MD [facility] [phone]

## 2016-02-03 NOTE — Progress Notes (Signed)
[address]       [place] [address]             [phone]        CARDIOTHORACIC SURGERY PROGRESS NOTE   R2 Days Post-Op Procedure(s) (LRB): MINIMALLY INVASIVE MITRAL VALVE REPAIR (MVR) with closure of PFO (Right) TRANSESOPHAGEAL ECHOCARDIOGRAM (TEE) (N/A) PATENT FORAMEN OVALE CLOSURE (N/A)  Subjective: Looks good and reports feeling much better.  Minimal soreness.  No nausea but not much of an appetite.  Objective: Vital signs: BP Readings from Last 1 Encounters:  02/03/16 115/64   Pulse Readings from Last 1 Encounters:  02/03/16 70   Resp Readings from Last 1 Encounters:  02/03/16 19   Temp Readings from Last 1 Encounters:  02/03/16 98.1 F (36.7 C) (Oral)    Hemodynamics:    Physical Exam:  Rhythm:   Junctional 40-50 - AAI pacing  Breath sounds: clear  Heart sounds:  RRR w/out murmur  Incisions:  Dressings dry, intact  Abdomen:  Soft, non-distended, non-tender  Extremities:  Warm, well-perfused  Chest tubes:  Decreasing but still significant volume thin serosanguinous output, no air leak    Intake/Output from previous day: 08/17 0701 - 08/18 0700 In: 1443 [P.O.:940; I.V.:3; IV Piggyback:500] Out: [id number]; Chest Tube:920] Intake/Output this shift: No intake/output data recorded.  Lab Results:  CBC: Recent Labs  02/02/16 1633 02/03/16 0400  WBC 8.7 8.5  HGB 9.2* 9.1*  HCT 28.2* 26.4*  PLT 123* 117*    BMET:  Recent Labs  02/02/16 0500 02/02/16 1620 02/02/16 1633 02/03/16 0400  NA 140 141  --  135  K 3.1* 3.6  --  3.7  CL 112* 101  --  104  CO2 21*  --   --  25  GLUCOSE 102* 126*  --  95  BUN 9 11  --  13  CREATININE 0.78 0.90 0.95 1.07*  CALCIUM 6.9*  --   --  7.2*     PT/INR:   Recent Labs  02/03/16 0400  LABPROT 17.3*  INR 1.40    CBG (last 3)   Recent Labs  02/02/16 1917 02/02/16 2349 02/03/16 0358  GLUCAP 121* 85 96    ABG    Component Value Date/Time   PHART 7.350  02/02/2016 0505   PCO2ART 40.7 02/02/2016 0505   PO2ART 91.0 02/02/2016 0505   HCO3 22.5 02/02/2016 0505   TCO2 24 02/02/2016 1620   ACIDBASEDEF 3.0 (H) 02/02/2016 0505   O2SAT 97.0 02/02/2016 0505    CXR: PORTABLE CHEST 1 VIEW  COMPARISON:  February 02, 2016  FINDINGS: Chest tube remains on the right with minimal right apical pneumothorax present. Cordis tip is in the left innominate vein; the Swan-Ganz catheter has been removed. Temporary pacemaker wires are attached to the right heart. There is patchy consolidation in the left base with small left pleural effusion. There is mild right base atelectasis. Lungs elsewhere clear. Heart size and pulmonary vascular normal. Mitral valve replacement present.  IMPRESSION: Minimal right apical pneumothorax with right chest tube present. Cordis tip in left innominate vein. Left lower lobe consolidation with small left effusion. Mild right base atelectasis. Stable cardiac silhouette.   Electronically Signed   By: Lowella Grip III M.D.   On: 02/03/2016 07:17   Assessment/Plan: S/P Procedure(s) (LRB): MINIMALLY INVASIVE MITRAL VALVE REPAIR (MVR) with closure of PFO (Right) TRANSESOPHAGEAL ECHOCARDIOGRAM (TEE) (N/A) PATENT FORAMEN OVALE CLOSURE (N/A)  Doing well POD2 Maintaining AAI paced rhythm  w/ stable BP, junctional rhythm underneath pacer w/ adequate HR Breathing comfortably w/ O2 sats 98% on 2 L/min Expected post op acute blood loss anemia, Hgb stable 9.1 this morning Expected post op volume excess, moderate but diuresing well Chest tubes still draining thin serosanguinous fluid Expected post op atelectasis, mild, R>L   Mobilize  Diuresis  Continue AAI pacing and hold beta blockers  Leave chest tubes in place until output decreases  Coumadin  Transfer step down  Rexene Alberts, MD 02/03/2016 9:00 AM

## 2016-02-04 ENCOUNTER — Inpatient Hospital Stay (HOSPITAL_COMMUNITY): Payer: Medicare Other

## 2016-02-04 LAB — CBC
HEMATOCRIT: 29.8 % — AB (ref 36.0–46.0)
HEMOGLOBIN: 9.5 g/dL — AB (ref 12.0–15.0)
MCH: 30.4 pg (ref 26.0–34.0)
MCHC: 31.9 g/dL (ref 30.0–36.0)
MCV: 95.5 fL (ref 78.0–100.0)
Platelets: 112 10*3/uL — ABNORMAL LOW (ref 150–400)
RBC: 3.12 MIL/uL — AB (ref 3.87–5.11)
RDW: 16.3 % — AB (ref 11.5–15.5)
WBC: 8.8 10*3/uL (ref 4.0–10.5)

## 2016-02-04 LAB — PROTIME-INR
INR: 1.58
Prothrombin Time: 19.1 seconds — ABNORMAL HIGH (ref 11.4–15.2)

## 2016-02-04 LAB — BASIC METABOLIC PANEL
ANION GAP: 6 (ref 5–15)
BUN: 12 mg/dL (ref 6–20)
CALCIUM: 7.7 mg/dL — AB (ref 8.9–10.3)
CO2: 28 mmol/L (ref 22–32)
Chloride: 103 mmol/L (ref 101–111)
Creatinine, Ser: 0.98 mg/dL (ref 0.44–1.00)
GFR calc non Af Amer: 57 mL/min — ABNORMAL LOW (ref >60)
GLUCOSE: 112 mg/dL — AB (ref 65–99)
POTASSIUM: 3.5 mmol/L (ref 3.5–5.1)
Sodium: 137 mmol/L (ref 135–145)

## 2016-02-04 MED ORDER — ASPIRIN 81 MG PO CHEW
81.0000 mg | CHEWABLE_TABLET | Freq: Every day | ORAL | Status: DC
  Administered 2016-02-04 – 2016-02-07 (×4): 81 mg via ORAL
  Filled 2016-02-04 (×4): qty 1

## 2016-02-04 MED ORDER — POTASSIUM CHLORIDE CRYS ER 20 MEQ PO TBCR
40.0000 meq | EXTENDED_RELEASE_TABLET | Freq: Once | ORAL | Status: AC
  Administered 2016-02-04: 40 meq via ORAL
  Filled 2016-02-04: qty 2

## 2016-02-04 MED ORDER — POTASSIUM CHLORIDE 20 MEQ/15ML (10%) PO SOLN
10.0000 meq | Freq: Two times a day (BID) | ORAL | Status: DC
  Administered 2016-02-04 – 2016-02-06 (×4): 10 meq via ORAL
  Filled 2016-02-04 (×4): qty 15

## 2016-02-04 NOTE — Progress Notes (Signed)
      [address]       [place] [address]             [phone]      No complaints  BP 119/65 (BP Location: Right Arm)   Pulse 67   Temp 97.9 F (36.6 C) (Oral)   Resp 19   Ht 5\' 4"  (1.626 m)   Wt 136 lb 1.6 oz (61.7 kg)   SpO2 96%   BMI 23.36 kg/m    Intake/Output Summary (Last 24 hours) at 02/04/16 1755 Last data filed at 02/04/16 1320  Gross per 24 hour  Intake              783 ml  Output             3515 ml  Net            -2732 ml    Still waiting on bed on [place]. Roxan Hockey, MD [facility] [phone]

## 2016-02-04 NOTE — Progress Notes (Signed)
3 Days Post-Op Procedure(s) (LRB): MINIMALLY INVASIVE MITRAL VALVE REPAIR (MVR) with closure of PFO (Right) TRANSESOPHAGEAL ECHOCARDIOGRAM (TEE) (N/A) PATENT FORAMEN OVALE CLOSURE (N/A) Subjective: C/o having to urinate frequently overnight  Objective: Vital signs in last 24 hours: Temp:  [97.8 F (36.6 C)-98.6 F (37 C)] 98.5 F (36.9 C) (08/19 0758) Pulse Rate:  [67-70] 67 (08/19 0700) Cardiac Rhythm: Atrial paced (08/19 0752) Resp:  [2-27] 27 (08/19 0700) BP: (93-119)/(52-89) 108/89 (08/19 0700) SpO2:  [90 %-100 %] 96 % (08/19 0700) Weight:  [136 lb 1.6 oz (61.7 kg)] 136 lb 1.6 oz (61.7 kg) (08/19 0500)  Hemodynamic parameters for last 24 hours:    Intake/Output from previous day: 08/18 0701 - 08/19 0700 In: 760 [P.O.:660; IV Piggyback:100] Out: [id number] [Urine:4200; Chest Tube:730] Intake/Output this shift: No intake/output data recorded.  General appearance: alert, cooperative and no distress Neurologic: intact Heart: regular rate and rhythm Lungs: diminished breath sounds bibasilar Abdomen: normal findings: soft, non-tender  Lab Results:  Recent Labs  02/03/16 0400 02/04/16 0312  WBC 8.5 8.8  HGB 9.1* 9.5*  HCT 26.4* 29.8*  PLT 117* 112*   BMET:  Recent Labs  02/03/16 0400 02/04/16 0312  NA 135 137  K 3.7 3.5  CL 104 103  CO2 25 28  GLUCOSE 95 112*  BUN 13 12  CREATININE 1.07* 0.98  CALCIUM 7.2* 7.7*    PT/INR:  Recent Labs  02/04/16 0312  LABPROT 19.1*  INR 1.58   ABG    Component Value Date/Time   PHART 7.350 02/02/2016 0505   HCO3 22.5 02/02/2016 0505   TCO2 24 02/02/2016 1620   ACIDBASEDEF 3.0 (H) 02/02/2016 0505   O2SAT 97.0 02/02/2016 0505   CBG (last 3)   Recent Labs  02/02/16 1917 02/02/16 2349 02/03/16 0358  GLUCAP 121* 85 96    Assessment/Plan: S/P Procedure(s) (LRB): MINIMALLY INVASIVE MITRAL VALVE REPAIR (MVR) with closure of PFO (Right) TRANSESOPHAGEAL ECHOCARDIOGRAM (TEE) (N/A) PATENT FORAMEN OVALE CLOSURE  (N/A) -  CV- s/p MV repair. Still atrial paced with junctional rhythm at 45 underneath pacer  On warfarin, INR up a little today  Change ASA from 325 to 81 mg  RESP- IS for basilar atelectasis  Ct drained 730 ml yesterday- keep in place  RENAL- creatinine OK, still has volume overload- PO lasix  K= 3.5- supplement  ENDO_ CBG well controlled  ambulate   LOS: 3 days    Melrose Nakayama 02/04/2016

## 2016-02-05 ENCOUNTER — Inpatient Hospital Stay (HOSPITAL_COMMUNITY): Payer: Medicare Other

## 2016-02-05 LAB — TYPE AND SCREEN
ABO/RH(D): A POS
ANTIBODY SCREEN: NEGATIVE
UNIT DIVISION: 0
UNIT DIVISION: 0
UNIT DIVISION: 0
UNIT DIVISION: 0
Unit division: 0
Unit division: 0

## 2016-02-05 LAB — PROTIME-INR
INR: 1.81
Prothrombin Time: 21.2 seconds — ABNORMAL HIGH (ref 11.4–15.2)

## 2016-02-05 LAB — BASIC METABOLIC PANEL
Anion gap: 7 (ref 5–15)
BUN: 9 mg/dL (ref 6–20)
CHLORIDE: 106 mmol/L (ref 101–111)
CO2: 28 mmol/L (ref 22–32)
CREATININE: 0.89 mg/dL (ref 0.44–1.00)
Calcium: 9 mg/dL (ref 8.9–10.3)
GFR calc Af Amer: >60 mL/min (ref >60)
GFR calc non Af Amer: >60 mL/min (ref >60)
GLUCOSE: 102 mg/dL — AB (ref 65–99)
POTASSIUM: 4.4 mmol/L (ref 3.5–5.1)
Sodium: 141 mmol/L (ref 135–145)

## 2016-02-05 MED ORDER — DICLOFENAC SODIUM 1 % TD GEL
2.0000 g | TRANSDERMAL | Status: DC | PRN
  Administered 2016-02-06 (×2): 2 g via TOPICAL
  Filled 2016-02-05: qty 100

## 2016-02-05 NOTE — Progress Notes (Addendum)
[address]       [place] [address]             [phone]      4 Days Post-Op Procedure(s) (LRB): MINIMALLY INVASIVE MITRAL VALVE REPAIR (MVR) with closure of PFO (Right) TRANSESOPHAGEAL ECHOCARDIOGRAM (TEE) (N/A) PATENT FORAMEN OVALE CLOSURE (N/A) Subjective: Feeling better overall  Objective: Vital signs in last 24 hours: Temp:  [97.6 F (36.4 C)-98.6 F (37 C)] 98.1 F (36.7 C) (08/20 0436) Pulse Rate:  [69-75] 75 (08/20 0436) Cardiac Rhythm: Atrial paced (08/19 1600) Resp:  [18-23] 18 (08/20 0436) BP: (107-121)/(55-66) 109/61 (08/20 0436) SpO2:  [96 %-98 %] 98 % (08/20 0436) Weight:  [132 lb 8 oz (60.1 kg)-134 lb 11.2 oz (61.1 kg)] 132 lb 8 oz (60.1 kg) (08/20 0457)  Hemodynamic parameters for last 24 hours:    Intake/Output from previous day: 08/19 0701 - 08/20 0700 In: 243 [P.O.:240; I.V.:3] Out: 1785 [Urine:1675; Chest Tube:110] Intake/Output this shift: Total I/O In: -  Out: 80 [Chest Tube:80]  General appearance: alert, cooperative and no distress Heart: regular rate and rhythm and no murmur or rub Lungs: clear to auscultation bilaterally Abdomen: benign Extremities: + LE edema Wound: incis healing well  Lab Results:  Recent Labs  02/03/16 0400 02/04/16 0312  WBC 8.5 8.8  HGB 9.1* 9.5*  HCT 26.4* 29.8*  PLT 117* 112*   BMET:  Recent Labs  02/04/16 0312 02/05/16 0308  NA 137 141  K 3.5 4.4  CL 103 106  CO2 28 28  GLUCOSE 112* 102*  BUN 12 9  CREATININE 0.98 0.89  CALCIUM 7.7* 9.0    PT/INR:  Recent Labs  02/05/16 0308  LABPROT 21.2*  INR 1.81   ABG    Component Value Date/Time   PHART 7.350 02/02/2016 0505   HCO3 22.5 02/02/2016 0505   TCO2 24 02/02/2016 1620   ACIDBASEDEF 3.0 (H) 02/02/2016 0505   O2SAT 97.0 02/02/2016 0505   CBG (last 3)   Recent Labs  02/02/16 1917 02/02/16 2349 02/03/16 0358  GLUCAP 121* 85 96    Meds Scheduled Meds: . acetaminophen (TYLENOL) oral liquid 160 mg/5  mL  1,000 mg Oral Q6H  . aspirin  81 mg Oral Daily  . bisacodyl  10 mg Oral Daily   Or  . bisacodyl  10 mg Rectal Daily  . docusate sodium  200 mg Oral Daily  . furosemide  40 mg Oral Daily  . pantoprazole  40 mg Oral Daily  . potassium chloride  10 mEq Oral BID  . sodium chloride flush  3 mL Intravenous Q12H  . sodium chloride flush  3 mL Intravenous Q12H  . warfarin  2.5 mg Oral q1800  . Warfarin - Physician Dosing Inpatient   Does not apply q1800   Continuous Infusions: . sodium chloride 250 mL (02/02/16 0600)   PRN Meds:.sodium chloride, metoprolol, morphine injection, ondansetron (ZOFRAN) IV, oxyCODONE, sodium chloride flush, sodium chloride flush, traMADol  Xrays Dg Chest Port 1 View  Result Date: 02/05/2016 CLINICAL DATA:  Status post mitral valve repair. EXAM: PORTABLE CHEST 1 VIEW COMPARISON:  02/04/2016. FINDINGS: Cardiac silhouette within normal limits for size. MVR remain satisfactory position. Overlying electrodes appears stable. RIGHT chest tube in good position. The apical pneumothorax seen on yesterday's film is not visible today. Small BILATERAL effusions, greater on the LEFT. IMPRESSION: RIGHT pneumothorax no longer visualized.  Otherwise stable chest. Electronically Signed   By: Roderic Ovens.D.  On: 02/05/2016 07:39   Dg Chest Port 1 View  Result Date: 02/04/2016 CLINICAL DATA:  Follow-up chest tube on the right. EXAM: PORTABLE CHEST 1 VIEW COMPARISON:  02/03/2016 FINDINGS: Tiny right apical pneumothorax is without change from the previous day's study. Right chest tube is stable with its tip at the right apex. Lung base opacity is similar to the previous day's exam, greater on the left. This may reflect pneumonia, atelectasis or a combination. Suspect small effusions. No convincing pulmonary edema. Cardiac silhouette is normal in size. Changes from valve replacement stable. Normal mediastinal contours. IMPRESSION: 1. No significant change from the previous day's  study. 2. Tiny right apical pneumothorax and a stable right chest tube. 3. Left greater than right lung base opacity which may reflect pneumonia, atelectasis or combination. Suspect small associated pleural effusions. No convincing pulmonary edema. Electronically Signed   By: Lajean Manes M.D.   On: 02/04/2016 07:45    Assessment/Plan: S/P Procedure(s) (LRB): MINIMALLY INVASIVE MITRAL VALVE REPAIR (MVR) with closure of PFO (Right) TRANSESOPHAGEAL ECHOCARDIOGRAM (TEE) (N/A) PATENT FORAMEN OVALE CLOSURE (N/A)  1 conts Apacing for underlying brady 2 CT- no air leak- 310cc out yesterday, 80 cc so far today- keep one more day 3 volume status improved- cont current diuretic dosing 4 cont coumadin- INR trending appropriatly 5 CXR is improved in appearance 6 labs stable, CBG's well controlled- not diabetic  LOS: 4 days    GOLD,WAYNE E 02/05/2016  Patient seen and examined, agree with Mr. Victorio Palm assessment  Revonda Standard. Roxan Hockey, MD [facility] [phone]

## 2016-02-05 NOTE — Discharge Instructions (Addendum)
Mitral Valve Repair, Care After Refer to this sheet in the next few weeks. These instructions provide you with information on caring for yourself after your procedure. Your health care provider may also give you specific instructions. Your treatment has been planned according to current medical practices, but problems sometimes occur. Call your health care provider if you have any problems or questions after your procedure.  HOME CARE INSTRUCTIONS   Take medicines only as directed by your health care provider.  Take your temperature every morning for the first 7 days after surgery. Write these down.  Weigh yourself every morning for at least 7 days after surgery. Write your weight down.  Wear elastic stockings during the day for at least 2 weeks after surgery or as directed by your health care provider. Use them longer if your ankles are swollen. The stockings help blood flow and help reduce swelling in the legs.  Take frequent naps or rest often throughout the day.  Avoid lifting more than 10 lb (4.5 kg) or pushing or pulling things with your arms for 6-8 weeks or as directed by your health care provider.  Avoid driving or airplane travel for 4-6 weeks after surgery or as directed. If you are riding in a car for an extended period, stop every 1-2 hours to stretch your legs.  Avoid crossing your legs.  Avoid climbing stairs and using the handrail to pull yourself up for the first 2-3 weeks after surgery.  Do not take baths for 2-4 weeks after surgery. Take showers once your health care provider approves. Pat incisions dry. Do not rub incisions with a washcloth or towel.  Return to work as directed by your health care provider.  Drink enough fluids to keep your urine clear or pale yellow.  Do not strain to have a bowel movement. Eat high-fiber foods if you become constipated. You may also take a medicine to help you have a bowel movement (laxative) as directed by your health care  provider.  Resume sexual activity as directed by your health care provider. SEEK MEDICAL CARE IF:   You develop a skin rash.   Your weight is increasing each day over 2-3 days.  Your weight increases by 2 or more pounds (1 kg) in a single day.  You have a fever. SEEK IMMEDIATE MEDICAL CARE IF:   You develop chest pain that is not coming from your incision.  You develop shortness of breath or difficulty breathing.  You have drainage, redness, swelling, or pain at your incision site.  You have pus coming from your incision.  You develop light-headedness. MAKE SURE YOU:  Understand these directions.  Will watch your condition.  Will get help right away if you are not doing well or get worse.   This information is not intended to replace advice given to you by your health care provider. Make sure you discuss any questions you have with your health care provider.   Document Released: 12/22/2004 Document Revised: 06/25/2014 Document Reviewed: 11/04/2012 Elsevier Interactive Patient Education 2016 [place] on my medicine - Coumadin   (Warfarin)  This medication education was reviewed with me or my healthcare representative as part of my discharge preparation.  The pharmacist that spoke with me during my hospital stay was:  Dareen Piano, [facility]  Why was Coumadin prescribed for you? Coumadin was prescribed for you because you have a blood clot or a medical condition that can cause an increased risk of forming blood clots. Blood clots  can cause serious health problems by blocking the flow of blood to the heart, lung, or brain. Coumadin can prevent harmful blood clots from forming. As a reminder your indication for Coumadin is:   Valve repair  What test will check on my response to Coumadin? While on Coumadin (warfarin) you will need to have an INR test regularly to ensure that your dose is keeping you in the desired range. The INR (international normalized  ratio) number is calculated from the result of the laboratory test called prothrombin time (PT).  If an INR APPOINTMENT HAS NOT ALREADY BEEN MADE FOR YOU please schedule an appointment to have this lab work done by your health care provider within 7 days. Your INR goal is usually a number between:  2 to 3 or your provider may give you a more narrow range like 2-2.5.  Ask your health care provider during an office visit what your goal INR is.  What  do you need to  know  About  COUMADIN? Take Coumadin (warfarin) exactly as prescribed by your healthcare provider about the same time each day.  DO NOT stop taking without talking to the doctor who prescribed the medication.  Stopping without other blood clot prevention medication to take the place of Coumadin may increase your risk of developing a new clot or stroke.  Get refills before you run out.  What do you do if you miss a dose? If you miss a dose, take it as soon as you remember on the same day then continue your regularly scheduled regimen the next day.  Do not take two doses of Coumadin at the same time.  Important Safety Information A possible side effect of Coumadin (Warfarin) is an increased risk of bleeding. You should call your healthcare provider right away if you experience any of the following: ? Bleeding from an injury or your nose that does not stop. ? Unusual colored urine (red or dark brown) or unusual colored stools (red or black). ? Unusual bruising for unknown reasons. ? A serious fall or if you hit your head (even if there is no bleeding).  Some foods or medicines interact with Coumadin (warfarin) and might alter your response to warfarin. To help avoid this: ? Eat a balanced diet, maintaining a consistent amount of Vitamin K. ? Notify your provider about major diet changes you plan to make. ? Avoid alcohol or limit your intake to 1 drink for women and 2 drinks for men per day. (1 drink is 5 oz. wine, 12 oz. beer, or 1.5  oz. liquor.)  Make sure that ANY health care provider who prescribes medication for you knows that you are taking Coumadin (warfarin).  Also make sure the healthcare provider who is monitoring your Coumadin knows when you have started a new medication including herbals and non-prescription products.  Coumadin (Warfarin)  Major Drug Interactions  Increased Warfarin Effect Decreased Warfarin Effect  Alcohol (large quantities) Antibiotics (esp. Septra/Bactrim, Flagyl, Cipro) Amiodarone (Cordarone) Aspirin (ASA) Cimetidine (Tagamet) Megestrol (Megace) NSAIDs (ibuprofen, naproxen, etc.) Piroxicam (Feldene) Propafenone (Rythmol SR) Propranolol (Inderal) Isoniazid (INH) Posaconazole (Noxafil) Barbiturates (Phenobarbital) Carbamazepine (Tegretol) Chlordiazepoxide (Librium) Cholestyramine (Questran) Griseofulvin Oral Contraceptives Rifampin Sucralfate (Carafate) Vitamin K   Coumadin (Warfarin) Major Herbal Interactions  Increased Warfarin Effect Decreased Warfarin Effect  Garlic Ginseng Ginkgo biloba Coenzyme Q10 Green tea St. Johns wort    Coumadin (Warfarin) FOOD Interactions  Eat a consistent number of servings per week of foods HIGH in Vitamin K (1 serving =  cup)  Collards (cooked, or boiled & drained) Kale (cooked, or boiled & drained) Mustard greens (cooked, or boiled & drained) Parsley *serving size only =  cup Spinach (cooked, or boiled & drained) Swiss chard (cooked, or boiled & drained) Turnip greens (cooked, or boiled & drained)  Eat a consistent number of servings per week of foods MEDIUM-HIGH in Vitamin K (1 serving = 1 cup)  Asparagus (cooked, or boiled & drained) Broccoli (cooked, boiled & drained, or raw & chopped) Brussel sprouts (cooked, or boiled & drained) *serving size only =  cup Lettuce, raw (green leaf, endive, romaine) Spinach, raw Turnip greens, raw & chopped   These websites have more information on Coumadin (warfarin):   FailFactory.se; VeganReport.com.au;

## 2016-02-06 LAB — PROTIME-INR
INR: 1.99
Prothrombin Time: 22.9 seconds — ABNORMAL HIGH (ref 11.4–15.2)

## 2016-02-06 MED ORDER — POTASSIUM CHLORIDE 20 MEQ/15ML (10%) PO SOLN
20.0000 meq | Freq: Every day | ORAL | Status: DC
  Administered 2016-02-06 – 2016-02-07 (×2): 20 meq via ORAL
  Filled 2016-02-06 (×2): qty 15

## 2016-02-06 NOTE — Discharge Summary (Signed)
Physician Discharge Summary       [address]       [place] [address]             [phone]    Patient ID: Destiny Andersen MRN: [id number] DOB/AGE: [birth year] 70 y.o.  Admit date: 02/01/2016 Discharge date: 02/07/2016  Admission Diagnoses: Severe mitral regurgitation  Active Diagnoses:  1. PFO 2. Cervical and skin cancer ([place]) 3. Chronic headaches 4. Tobacco abuse 5. Mitral valve prolapse 6. Pre eclampsia-blood transfusion and seizures 7. Risk for falls 8. Mild thrombocytopenia 9. ABL anemia   Procedure (s):   Minimally-Invasive Mitral Valve Repair                       Complex valvuloplasty including triangular resection of flail segment of posterior leaflet                       Artificial Gore-tex neochord placement x10                       Sorin Memo 3D Ring Annuloplasty (size 55mm, catalog # [id number], serial # [id number])                       Closure of patent foramen ovale                       Placement of left femoral arterial line by Dr. Roxy Manns on 02/01/2016.  History of Presenting Illness: Patient is a 70 year old female with long history of mitral valve prolapse who has been referred for surgical consultation to discuss treatment options for management of severe asymptomatic primary mitral regurgitation. The patient reportedly was told that she had mitral valve prolapse many years ago. Approximately 3 years ago she was noted by her primary care physician to have a prominent murmur on physical exam and echocardiogram revealed the presence of mitral valve prolapse with severe mitral regurgitation. She has been followed carefully by Dr. Acie Fredrickson ever since. She has remained physically active and reportedly asymptomatic. She was seen in follow-up recently by Dr. Acie Fredrickson and transthoracic echocardiogram revealed mitral valve prolapse with a flail segment of the posterior leaflet of mitral valve and severe mitral regurgitation. Left ventricular size and  systolic function remains normal. Transesophageal echocardiogram was performed, confirming the presence of a flail segment of the posterior leaflet with ruptured chordae tendineae. The patient was referred for surgical consultation.  The patient is married and lives locally in [place] with her husband. 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 Now that she has been completely retired for 10 years, the patient remains fairly active physically. She walks her dog every day and exercises regularly with a trainer. She does not typically push herself with intense aerobic physical activity but she certainly remains active. She denies any symptoms of exertional shortness of breath or fatigue. She denies any recent significant change in her exercise tolerance, although she states that she would get winded if she pushed herself strenuously. She has never experienced any exertional chest pain or chest tightness. She denies any history of PND, orthopnea, palpitations, dizzy spells, or syncope.  Patient returns to the office today for follow-up of severe symptomatic primary mitral regurgitation with tentative plans to proceed with minimally invasive  mitral valve repair later this week. She was originally seen in consultation on 10/17/2015 and she was seen more recently on 01/02/2016. Since then she has remained clinically stable. She describes stable symptoms of mild exertional shortness of breath. Symptoms have not progressed. She denies any recent fevers, chills, or productive cough. Appetite is stable. She is anxious to get her surgery over with.  Patient has stage D severe symptomatic primary mitral regurgitation. The patient and her family were againcounseled at length regarding the indications, risks and potential benefits of mitral valve repair. The rationale for elective  surgery has been explained, including a comparison between surgery and continued medical therapy with close follow-up. The likelihood of successful and durable valve repair has been discussed with particular reference to the findings of their recent echocardiogram. Based upon these findings and previous experience, I have quoted them a greater than 95percent likelihood of successful valve repair. In the unlikely event that their valve cannot be successfully repaired, we discussed the possibility of replacing the mitral valve using a mechanical prosthesis with the attendant need for long-term anticoagulation versus the alternative of replacing it using a bioprosthetic tissue valve with its potential for late structural valve deterioration and failure, depending upon the patient's longevity. The patient specifically requests that if the mitral valve must be replaced that it be done using a bioprosthetic tissuevalve. She was admitted on 02/01/2016 in order to undergo a minimally invasive mitral valve repair, closure of PFO, and placement of femoral a line by Dr. Roxy Manns.  Brief Hospital Course:  The patient was extubated the morning of post operative day one. She remained afebrile and hemodynamically stable. She was initially AAI paced. Gordy Councilman, a line, and foley were removed early in the post operative course. Chest tubes remained for several days and were removed on 08/21. She was not started on Lopressor secondary to bradycardia.She was volume over loaded and diuresed. She was started on Coumadin. Her PT and INR were monitored daily. Her last INR was up to 2.09. She is currently on Coumadin 2.5 mg daily. She had ABL anemia. She did not require a post op transfusion. Her last H and H was 9.5 and 29.8. She also had mild thrombocytopenia. Her last platelet count was 112,000. She was weaned off the insulin drip.  The patient's HGA1C pre op was  5.5. The patient was felt surgically stable for transfer from the  ICU to [facility] for further convalescence on 02/05/2016. She continues to progress with cardiac rehab.She was ambulating on room air. She has been tolerating a diet and has had a bowel movement. Epicardial pacing wires were removed on 08/21. Chest tube sutures will be removed in the office after discharge. The patient is felt surgically stable for discharge today.  Latest Vital Signs: Blood pressure (!) 99/56, pulse 74, temperature 98.1 F (36.7 C), temperature source Oral, resp. rate 18, height 5\' 4"  (1.626 m), weight 123 lb (55.8 kg), SpO2 98 %.  Physical Exam: Cardiovascular: RRR, no murmur Pulmonary: Slightly diminished at both bases Abdomen: Soft, non tender, bowel sounds present. Extremities: Mild lower extremity edema. Wounds: Clean and dry.  No erythema or signs of infection. Ecchymosis right anterior chest wall  Discharge Condition:Stable and discharged to home.  Recent laboratory studies:  Lab Results  Component Value Date   WBC 8.8 02/04/2016   HGB 9.5 (L) 02/04/2016   HCT 29.8 (L) 02/04/2016   MCV 95.5 02/04/2016   PLT 112 (L) 02/04/2016   Lab Results  Component Value  Date   NA 141 02/05/2016   K 4.4 02/05/2016   CL 106 02/05/2016   CO2 28 02/05/2016   CREATININE 0.89 02/05/2016   GLUCOSE 102 (H) 02/05/2016    Diagnostic Studies:    Dg Chest Port 1 View  Result Date: 02/05/2016 CLINICAL DATA:  Status post mitral valve repair. EXAM: PORTABLE CHEST 1 VIEW COMPARISON:  02/04/2016. FINDINGS: Cardiac silhouette within normal limits for size. MVR remain satisfactory position. Overlying electrodes appears stable. RIGHT chest tube in good position. The apical pneumothorax seen on yesterday's film is not visible today. Small BILATERAL effusions, greater on the LEFT. IMPRESSION: RIGHT pneumothorax no longer visualized.  Otherwise stable chest. Electronically Signed   By: Staci Righter M.D.   On: 02/05/2016 07:39   Discharge Instructions    Amb Referral to Cardiac Rehabilitation     Complete by:  As directed   Diagnosis:  Valve Repair Comment - min invasive, PFO closure   Valve:  Mitral      Discharge Medications:   Medication List    STOP taking these medications   amiodarone 200 MG tablet Commonly known as:  PACERONE     TAKE these medications   aspirin 81 MG tablet Take 81 mg by mouth daily.   calcium citrate-vitamin D 315-200 MG-UNIT tablet Commonly known as:  CITRACAL+D Take 1 tablet by mouth 3 (three) times daily. VITAMIN D DOSAGE IS 250MG  INSTEAD OF 200   diclofenac sodium 1 % Gel Commonly known as:  VOLTAREN Apply 2 g topically as needed (back pain).   furosemide 40 MG tablet Commonly known as:  LASIX Take 1 tablet (40 mg total) by mouth daily. For 4 days then stop.   potassium chloride 20 MEQ/15ML (10%) Soln Take 15 mLs (20 mEq total) by mouth daily. For 4 days then stop.   traMADol 50 MG tablet Commonly known as:  ULTRAM Take 1 tablet (50 mg total) by mouth every 6 (six) hours as needed for moderate pain.   warfarin 2.5 MG tablet Commonly known as:  COUMADIN Take 1 tablet (2.5 mg total) by mouth daily at 6 PM. Or as directed.      The patient has been discharged on:   1.Beta Blocker:  Yes [   ]                              No   [ x  ]                              If No, reason:Bradycardia  2.Ace Inhibitor/ARB: Yes [   ]                                     No  [  x  ]                                     If No, reason:Labile BP  3.Statin:   Yes [   ]                  No  [ x  ]                  If No, reason:No CAD  4.Ecasa:  Yes  [ x  ]                  No   [   ]                  If No, reason:  Follow Up Appointments: Follow-up Information    Rexene Alberts, MD Follow up on 02/21/2016.   Specialty:  Cardiothoracic Surgery Why:  PA/LAT CXR to be taken (at [facility] which is in the same building as Dr. Guy Sandifer office) on 02/21/2016 at 4:00 pm;Appointment time is at 4:30 pm Contact information: [place]  [id number] [phone]           Signed: Lars Pinks MPA-C 02/07/2016, 2:35 PM

## 2016-02-06 NOTE — Progress Notes (Addendum)
      [address]       [place] [address]             [phone]        5 Days Post-Op Procedure(s) (LRB): MINIMALLY INVASIVE MITRAL VALVE REPAIR (MVR) with closure of PFO (Right) TRANSESOPHAGEAL ECHOCARDIOGRAM (TEE) (N/A) PATENT FORAMEN OVALE CLOSURE (N/A)  Subjective: Patient had loose stools, but resolved since stool softeners stopped.  Objective: Vital signs in last 24 hours: Temp:  [98.7 F (37.1 C)-99 F (37.2 C)] 98.9 F (37.2 C) (08/21 0318) Pulse Rate:  [62-78] 67 (08/21 0318) Cardiac Rhythm: Junctional rhythm (08/20 1900) Resp:  [18] 18 (08/21 0318) BP: (97-118)/(51-57) 97/51 (08/21 0318) SpO2:  [96 %-99 %] 96 % (08/21 0318) Weight:  [129 lb 12.8 oz (58.9 kg)] 129 lb 12.8 oz (58.9 kg) (08/21 0318)  Pre op weight 57 kg Current Weight  02/06/16 129 lb 12.8 oz (58.9 kg)      Intake/Output from previous day: 08/20 0701 - 08/21 0700 In: -  Out: 530 [Urine:450; Chest Tube:80]   Physical Exam:  Cardiovascular: RRR, no murmur Pulmonary: Coarse on the right and clear on the left. Abdomen: Soft, non tender, bowel sounds present. Extremities: Mild bilateral lower extremity edema. Wounds: Clean and dry.  No erythema or signs of infection. Ecchymosis right anterior chest wall  Lab Results: CBC: Recent Labs  02/04/16 0312  WBC 8.8  HGB 9.5*  HCT 29.8*  PLT 112*   BMET:  Recent Labs  02/04/16 0312 02/05/16 0308  NA 137 141  K 3.5 4.4  CL 103 106  CO2 28 28  GLUCOSE 112* 102*  BUN 12 9  CREATININE 0.98 0.89  CALCIUM 7.7* 9.0    PT/INR:  Lab Results  Component Value Date   INR 1.99 02/06/2016   INR 1.81 02/05/2016   INR 1.58 02/04/2016   ABG:  INR: Will add last result for INR, ABG once components are confirmed Will add last 4 CBG results once components are confirmed  Assessment/Plan:  1. CV - Previous bradycardia. SR in the 60's to low 70's this am. INR increased from 1.81 to 1.99. Continue with current Coumadin  dose. 2.  Pulmonary - On room air. Chest tubes with 80 cc last 24 hours. Will likely remove chest tubes. Check CXR in am. Encourage incentive spirometer. 3. Volume Overload - On Lasix 40 mg daily. 4.  Acute blood loss anemia - Last H and H 9.5 and 29.8 5. Mild thrombocytopenia-last platelets 112,000.   ZIMMERMAN,DONIELLE MPA-C 02/06/2016,7:49 AM  I have seen and examined the patient and agree with the assessment and plan as outlined.  Maintaining NSR.  D/C pacing wires and chest tubes.  Tentatively plan D/C home tomorrow.  Weight still slightly above pre-op baseline - continue lasix and potassium for 3 more days.  Recheck INR in am tomorrow before confirming coumadin dose, but it looks like 2.5 mg/day may be a good starting dose.  Rexene Alberts, MD 02/06/2016 8:28 AM

## 2016-02-06 NOTE — Progress Notes (Signed)
CARDIAC REHAB PHASE I   PRE:  Rate/Rhythm: 24 SR  BP:  Sitting: 118/66        SaO2: 98 RA  MODE:  Ambulation: 550 ft   POST:  Rate/Rhythm: 81 SR  BP:  Sitting: 101/52         SaO2: 99 RA  Pt ambulated 550 ft on RA, chest tube, pacer, rolling walker, assist x1, steady gait, tolerated well. Pt c/o mild DOE, states she did not walk in hallway all weekend, denies any other complaints, declined rest stop. Encouraged ambulation x2 more today, pt verbalized understanding. Pt to recliner after walk, feet elevated call bell within reach. Will follow.   [id number] Lenna Sciara, RN, BSN 02/06/2016 9:23 AM

## 2016-02-06 NOTE — Progress Notes (Signed)
EPW removed. Pt educated on hour bed rest. Vital signs taken. Will continue to monitor pt.

## 2016-02-06 NOTE — Progress Notes (Signed)
Chest tubes removed. Pt tolerated it well. Will continue to monitor pt.

## 2016-02-06 NOTE — Care Management Important Message (Signed)
Important Message  Patient Details  Name: Destiny Andersen MRN: [id number] Date of Birth: [birth year]   Medicare Important Message Given:  Yes    Layth Cerezo Abena 02/06/2016, 11:09 AM

## 2016-02-07 ENCOUNTER — Inpatient Hospital Stay (HOSPITAL_COMMUNITY): Payer: Medicare Other

## 2016-02-07 LAB — PROTIME-INR
INR: 2.09
Prothrombin Time: 23.8 seconds — ABNORMAL HIGH (ref 11.4–15.2)

## 2016-02-07 MED ORDER — DICLOFENAC SODIUM 1 % TD GEL: 2.0000 g | TRANSDERMAL | Status: DC | PRN

## 2016-02-07 MED ORDER — FUROSEMIDE 40 MG PO TABS: 40.0000 mg | ORAL_TABLET | Freq: Every day | ORAL | 0 refills | Status: DC

## 2016-02-07 MED ORDER — TRAMADOL HCL 50 MG PO TABS: 50.0000 mg | ORAL_TABLET | Freq: Four times a day (QID) | ORAL | 0 refills | Status: DC | PRN

## 2016-02-07 MED ORDER — WARFARIN SODIUM 2.5 MG PO TABS: 2.5000 mg | ORAL_TABLET | Freq: Every day | ORAL | 1 refills | Status: DC

## 2016-02-07 MED ORDER — POTASSIUM CHLORIDE 20 MEQ/15ML (10%) PO SOLN: 20.0000 meq | Freq: Every day | ORAL | 0 refills | Status: DC

## 2016-02-07 NOTE — Progress Notes (Signed)
CARDIAC REHAB PHASE I   Pt ambulating independently with family and rolling walker (pt already walked this morning, declines additional ambulation at this time). Cardiac surgery discharge education completed with pt and daughter at bedside. Reviewed IS, restrictions, activity progression, exercise,  heart healthy diet, and phase 2 cardiac rehab. Pt verbalized understanding, receptive to education.  Pt agrees to phase 2 cardiac rehab referral, will send to [facility] per pt request. Pt would benefit from RW for home use, case manager notified. Pt in recliner, call bell within reach.  [id number] Lenna Sciara, RN, BSN 02/07/2016 10:08 AM

## 2016-02-07 NOTE — Progress Notes (Signed)
      [address]       [place] [address]             [phone]        6 Days Post-Op Procedure(s) (LRB): MINIMALLY INVASIVE MITRAL VALVE REPAIR (MVR) with closure of PFO (Right) TRANSESOPHAGEAL ECHOCARDIOGRAM (TEE) (N/A) PATENT FORAMEN OVALE CLOSURE (N/A)  Subjective: Patient without complaints and wants to go home.  Objective: Vital signs in last 24 hours: Temp:  [97.5 F (36.4 C)-98.4 F (36.9 C)] 98.1 F (36.7 C) (08/22 0500) Pulse Rate:  [62-74] 74 (08/22 0500) Cardiac Rhythm: Normal sinus rhythm (08/21 1900) Resp:  [18-19] 18 (08/22 0500) BP: (98-136)/(56-80) 109/57 (08/22 0500) SpO2:  [97 %-100 %] 97 % (08/22 0500) Weight:  [123 lb (55.8 kg)] 123 lb (55.8 kg) (08/22 0500)  Pre op weight 57 kg Current Weight  02/07/16 123 lb (55.8 kg)      Intake/Output from previous day: 08/21 0701 - 08/22 0700 In: [id number] [P.O.:1440; I.V.:3] Out: 1351 [Urine:1300; Stool:1; Chest Tube:50]   Physical Exam:  Cardiovascular: RRR, no murmur Pulmonary: Slightly diminished at both bases Abdomen: Soft, non tender, bowel sounds present. Extremities: Mild bilateral lower extremity edema. Wounds: Clean and dry.  No erythema or signs of infection. Ecchymosis right anterior chest wall and both breasts  Lab Results: CBC:No results for input(s): WBC, HGB, HCT, PLT in the last 72 hours. BMET:   Recent Labs  02/05/16 0308  NA 141  K 4.4  CL 106  CO2 28  GLUCOSE 102*  BUN 9  CREATININE 0.89  CALCIUM 9.0    PT/INR:  Lab Results  Component Value Date   INR 2.09 02/07/2016   INR 1.99 02/06/2016   INR 1.81 02/05/2016   ABG:  INR: Will add last result for INR, ABG once components are confirmed Will add last 4 CBG results once components are confirmed  Assessment/Plan:  1. CV - Previous bradycardia. SR in the 60's this am. INR increased from 1.99 to 2.09. Continue with current Coumadin dose. 2.  Pulmonary - On room air. CXR this am shows small  bilateral pleural effusions and no pneumothorax . Encourage incentive spirometer. 3. Volume Overload - On Lasix 40 mg daily. She is below pre op weight but still with small effusions. Will give a few more days of Lasix then stop. 4.  Acute blood loss anemia - Last H and H 9.5 and 29.8 5. Mild thrombocytopenia-last platelets 112,000. 6. Likely discharge later today  Asherah Lavoy MPA-C 02/07/2016,7:31 AM

## 2016-02-07 NOTE — Progress Notes (Signed)
Nurse reported that patient states she sees floaters and her m's look like n's and her t's look like I's. Apparently, she had floaters prior to surgery. Her vital signs have been stable and she has NO acute neurologic deficit. I discussed with Dr. Roxy Manns and is ok for discharge home today.

## 2016-02-09 ENCOUNTER — Ambulatory Visit (INDEPENDENT_AMBULATORY_CARE_PROVIDER_SITE_OTHER): Payer: Medicare Other | Admitting: *Deleted

## 2016-02-09 ENCOUNTER — Encounter (INDEPENDENT_AMBULATORY_CARE_PROVIDER_SITE_OTHER): Payer: Self-pay

## 2016-02-09 DIAGNOSIS — Z7901 Long term (current) use of anticoagulants: Secondary | ICD-10-CM | POA: Insufficient documentation

## 2016-02-09 DIAGNOSIS — Z9889 Other specified postprocedural states: Secondary | ICD-10-CM

## 2016-02-09 LAB — POCT INR: INR: 2.1

## 2016-02-09 NOTE — Patient Instructions (Signed)

## 2016-02-15 ENCOUNTER — Encounter: Payer: Self-pay | Admitting: *Deleted

## 2016-02-16 NOTE — Progress Notes (Signed)
Cardiology Office Note    Date:  02/17/2016   ID:  RABECCA Andersen, DOB 1946-04-17, MRN [id number]  PCP:  Marjorie Smolder, MD  Cardiologist:  Dr. Acie Fredrickson  CC: post hospital follow up after minimally invasive mitral valve repair  History of Present Illness:  Destiny Andersen is a 70 y.o. female with a history of mitral valve prolapse with severe MR s/p minimally invasive mitral valve repair and PFO closure who presents to clinic for post hospital follow up.  The patient reportedly was told that she had mitral valve prolapse many years ago. Approximately 3 years ago she was noted by her primary care physician to have a prominent murmur on physical exam and echocardiogram revealed the presence of mitral valve prolapse with severe mitral regurgitation. She has been followed carefully by Dr. Acie Fredrickson ever since. She has remained physically active and reportedly asymptomatic. She was seen in follow-up recently by Dr. Acie Fredrickson and transthoracic echocardiogram revealed mitral valve prolapse with a flail segment of the posterior leaflet of mitral valve and severe mitral regurgitation. Left ventricular size and systolic function remains normal. Transesophageal echocardiogram was performed, confirming the presence of a flail segment of the posterior leaflet with ruptured chordae tendineae. L/RHC on 12/21/15 showed normal coronary arteries, normal LV function and severe MR. The patient was referred for surgical consultation.   She ultimately underwent minimally invasive mitral valve repair and PFO closure with Dr. Roxy Manns on 02/01/16. Her post op course was c/b post op thrombocytopenia and volume overload requiring diuresis.   Today she presents to clinic for follow up. She has been feeling pretty good since the surgery except her back hurts. This all started after the surgery and she complained of this during her admission and they treated it with analgesic cream. Otherwise, no CP and some occasional SOB. She has had  some twinges in her chest. No LE edema, orthopnea or PND. No dizziness or syncope. No blood in her stool or urine.     Past Medical History:  Diagnosis Date  . Cancer [facility]) 1983   Cervical cancer.  Skin Cancer- leg  . Chronic headaches    "? Migraines"- does not have then any more  . Constipation   . Elevated C-reactive protein (CRP)   . Family history of breast cancer    Mother  . Fatigue   . Heart murmur    ECHO 02/10/13 EF = 60-65%, moderate posterior mitral valve prolapse w/possible flail segment, severe MR, mitral regurgitant jet is anteriorly directed.  Marland Kitchen History of blood transfusion    pre-eclampsia   . Hyperpotassemia    Migrated  . Medicare annual wellness visit, subsequent   . Mitral valve prolapse   . MR (mitral regurgitation)    ECHO 02/10/13 EF = 60-65%, moderate posterior mitral valve prolapse w/possible flail segment, severe MR, mitral regurgitant jet is anteriorly directed.  . Risk for falls   . Routine general medical examination at a health care facility   . S/P minimally invasive mitral valve repair 02/01/2016   Complex valvuloplasty including triangular resection of flail posterior leaflet, artificial Gore-tex neochord placement x10 and 28 mm Sorin Memo 3D ring annuloplasty via right mini thoracotomy approach  . Seizures ([place])    with pre- echa  . Shortness of breath dyspnea    with exertion    Past Surgical History:  Procedure Laterality Date  . BREAST BIOPSY Right 1998   benign cyst  . CARDIAC CATHETERIZATION N/A 12/21/2015   Procedure: Right/Left Heart  Cath and Coronary Angiography;  Surgeon: Sherren Mocha, MD;  Location: [place] [facility];  Service: Cardiovascular;  Laterality: N/A;  . COLONOSCOPY  08/2001   Colon cancer screening in 01/2012  . LAPAROSCOPIC CHOLECYSTECTOMY  1990  . MITRAL VALVE REPAIR Right 02/01/2016   Procedure: MINIMALLY INVASIVE MITRAL VALVE REPAIR (MVR) with closure of PFO;  Surgeon: Rexene Alberts, MD;  Location: [place];   Service: Open Heart Surgery;  Laterality: Right;  . TEE WITHOUT CARDIOVERSION N/A 10/14/2015   Procedure: TRANSESOPHAGEAL ECHOCARDIOGRAM (TEE);  Surgeon: Thayer Headings, MD;  Location: [place];  Service: Cardiovascular;  Laterality: N/A;  . TEE WITHOUT CARDIOVERSION N/A 02/01/2016   Procedure: TRANSESOPHAGEAL ECHOCARDIOGRAM (TEE);  Surgeon: Rexene Alberts, MD;  Location: [place];  Service: Open Heart Surgery;  Laterality: N/A;  . TOTAL ABDOMINAL HYSTERECTOMY  1983   Cervical cancer 1983    Current Medications: Outpatient Medications Prior to Visit  Medication Sig Dispense Refill  . aspirin 81 MG tablet Take 81 mg by mouth daily.    . calcium citrate-vitamin D (CITRACAL+D) 315-200 MG-UNIT tablet Take 1 tablet by mouth 3 (three) times daily. VITAMIN D DOSAGE IS 250MG  INSTEAD OF 200    . diclofenac sodium (VOLTAREN) 1 % GEL Apply 2 g topically as needed (back pain).    . traMADol (ULTRAM) 50 MG tablet Take 1 tablet (50 mg total) by mouth every 6 (six) hours as needed for moderate pain. 30 tablet 0  . warfarin (COUMADIN) 2.5 MG tablet Take 1 tablet (2.5 mg total) by mouth daily at 6 PM. Or as directed. 30 tablet 1  . furosemide (LASIX) 40 MG tablet Take 1 tablet (40 mg total) by mouth daily. For 4 days then stop. 4 tablet 0  . potassium chloride 20 MEQ/15ML (10%) SOLN Take 15 mLs (20 mEq total) by mouth daily. For 4 days then stop. 60 mL 0   No facility-administered medications prior to visit.      Allergies:   Penicillins; Codeine; Doxycycline hyclate; and Ultram [tramadol]   Social History   Social History  . Marital status: Married    Spouse name: N/A  . Number of children: 1  . Years of education: N/A   Social History Main Topics  . Smoking status: Former Smoker    Years: 25.00    Types: Cigarettes    Quit date: 06/18/1985  . Smokeless tobacco: Never Used  . Alcohol use 1.0 oz/week    2 Standard drinks or equivalent per week     Comment: rare  . Drug use: No  . Sexual  activity: No   Other Topics Concern  . None   Social History Narrative  . None     Family History:  The patient's family history includes AAA (abdominal aortic aneurysm) in her father; Breast cancer in her mother; CAD in her father and mother; Heart disease in her father and mother; High Cholesterol in her brother; Hypertension in her brother, father, and mother; Skin cancer in her brother.     ROS:   Please see the history of present illness.    ROS All other systems reviewed and are negative.   PHYSICAL EXAM:   VS:  BP 115/68   Pulse (!) 103   Ht 5\' 4"  (1.626 m)   Wt 123 lb 12.8 oz (56.2 kg)   BMI 21.25 kg/m    GEN: Well nourished, well developed, in no acute distress  HEENT: normal  Neck: no JVD, carotid bruits, or  masses Cardiac: RRR; no murmurs, rubs, or gallops,no edema  Respiratory:  clear to auscultation bilaterally, normal work of breathing GI: soft, nontender, nondistended, + BS MS: no deformity or atrophy  Skin: warm and dry, no rash Neuro:  Alert and Oriented x 3, Strength and sensation are intact Psych: euthymic mood, full affect  Wt Readings from Last 3 Encounters:  02/17/16 123 lb 12.8 oz (56.2 kg)  02/07/16 123 lb (55.8 kg)  01/30/16 126 lb (57.2 kg)      Studies/Labs Reviewed:   EKG:  EKG is ordered today.  The ekg ordered today demonstrates sinus tachy HR 103  Recent Labs: 01/30/2016: ALT 17 02/02/2016: Magnesium 2.2 02/04/2016: Hemoglobin 9.5; Platelets 112 02/05/2016: BUN 9; Creatinine, Ser 0.89; Potassium 4.4; Sodium 141   Lipid Panel No results found for: CHOL, TRIG, HDL, CHOLHDL, VLDL, LDLCALC, LDLDIRECT  Additional studies/ records that were reviewed today include:  Conclusion 12/21/15  There is hyperdynamic left ventricular systolic function.   1. Patent coronary arteries, right-dominant, with no obstructive CAD 2. Vigorous LV systolic function without regional wall motion abnormalities 3. Severe mitral regurgitation by angiographic and  hemodynamic assessment 4. Normal right-sided heart pressures    Minimally-Invasive Mitral Valve Repair Complex valvuloplasty including triangular resection of flail segment of posterior leaflet Artificial Gore-tex neochord placement x10 Sorin Memo 3D Ring Annuloplasty (size 12mm, catalog # [id number], serial # [id number]) Closure of patent foramen ovale Placement of left femoral arterial line by Dr. Roxy Manns on 02/01/2016.   TEE: 02/01/2016 LV EF: 60% -   65% Study Conclusions - Left ventricle: The cavity size was normal. Wall thickness was normal. Systolic function was normal. The estimated ejection fraction was in the range of 60% to 65%. Wall motion was normal; there were no regional wall motion abnormalities. - Mitral valve: Normal-sized annulus. Mildly thickened posterior leaflet . Leaflet separation was normal. Mobility was not restricted. Flail motion of the P2 segment of the posterior  leaflet. There appear to be several ruptured chordae. Transvalvular velocity was within the normal range. There was no evidence for stenosis. There was severe regurgitation, with multiple jets directed anteriorly, toward the septum, and along  the left atrial wall. There is systolic blunting of pulmonary vein flow, with intermittent flow reversal. - Left atrium: The atrium was dilated. No evidence of thrombus in the atrial cavity or appendage. - Atrial septum: The septum is extremely bowed from left to right,  consistent with increased left atrial pressure. No defect or  patent foramen ovale was identified on color doppler interrogation. - Staged echo: Limited post CPB study: Good LVEF, unchanged from the pre-op exam. No change post bypass in aortic valve function. The new mitral annular ring is in good position. Post mitral valve repair images demonstrate no residual MR, no MS (mean gradient 3 mmHg), and no  perivalvular leak. Impressions: - Post mitral valve repair images demonstrate no residual mitral regurgitation, stenosis or perivalvular leak. The new mitral annular ring is in good position. No other change from pre-bypass images.   ASSESSMENT & PLAN:   Mitral valve prolapse with severe MR s/p minimally invasive mitral valve repair and PFO closure: doing well. Continue coumadin for now.    Back pain: likely surgery related. Continue Tramadol and Tylenol   Medication Adjustments/Labs and Tests Ordered: Current medicines are reviewed at length with the patient today.  Concerns regarding medicines are outlined above.  Medication changes, Labs and Tests ordered today are listed in the Patient Instructions below. Patient Instructions  Medication  Instructions:  Your physician recommends that you continue on your current medications as directed. Please refer to the Current Medication list given to you today.   Labwork: None ordered  Testing/Procedures: None ordered  Follow-Up: Your physician recommends that you schedule a follow-up appointment in: SEE DR. [name] AS PLANNED   Any Other Special Instructions Will Be Listed Below (If Applicable).     If you need a refill on your cardiac medications before your next appointment, please call your pharmacy.      Signed, Angelena Form, PA-C  02/17/2016 3:52 PM    [facility] [address], [place], [place]  [address] Phone: [phone]; Fax: [phone]

## 2016-02-17 ENCOUNTER — Ambulatory Visit (INDEPENDENT_AMBULATORY_CARE_PROVIDER_SITE_OTHER): Payer: Medicare Other | Admitting: Pharmacist

## 2016-02-17 ENCOUNTER — Encounter: Payer: Self-pay | Admitting: Physician Assistant

## 2016-02-17 ENCOUNTER — Other Ambulatory Visit: Payer: Self-pay

## 2016-02-17 ENCOUNTER — Encounter (INDEPENDENT_AMBULATORY_CARE_PROVIDER_SITE_OTHER): Payer: Self-pay

## 2016-02-17 ENCOUNTER — Ambulatory Visit (INDEPENDENT_AMBULATORY_CARE_PROVIDER_SITE_OTHER): Payer: Medicare Other | Admitting: Physician Assistant

## 2016-02-17 VITALS — BP 115/68 | HR 103 | Ht 64.0 in | Wt 123.8 lb

## 2016-02-17 DIAGNOSIS — I341 Nonrheumatic mitral (valve) prolapse: Secondary | ICD-10-CM | POA: Diagnosis not present

## 2016-02-17 DIAGNOSIS — Z9889 Other specified postprocedural states: Secondary | ICD-10-CM

## 2016-02-17 DIAGNOSIS — I340 Nonrheumatic mitral (valve) insufficiency: Secondary | ICD-10-CM

## 2016-02-17 DIAGNOSIS — Z7901 Long term (current) use of anticoagulants: Secondary | ICD-10-CM

## 2016-02-17 LAB — POCT INR: INR: 1.8

## 2016-02-17 NOTE — Patient Instructions (Addendum)
Medication Instructions:  Your physician recommends that you continue on your current medications as directed. Please refer to the Current Medication list given to you today.   Labwork: None ordered  Testing/Procedures: None ordered  Follow-Up: Your physician recommends that you schedule a follow-up appointment in: SEE DR. [name] AS PLANNED   Any Other Special Instructions Will Be Listed Below (If Applicable).     If you need a refill on your cardiac medications before your next appointment, please call your pharmacy.

## 2016-02-21 ENCOUNTER — Ambulatory Visit (INDEPENDENT_AMBULATORY_CARE_PROVIDER_SITE_OTHER): Payer: Self-pay | Admitting: Thoracic Surgery (Cardiothoracic Vascular Surgery)

## 2016-02-21 ENCOUNTER — Encounter: Payer: Self-pay | Admitting: Thoracic Surgery (Cardiothoracic Vascular Surgery)

## 2016-02-21 ENCOUNTER — Ambulatory Visit
Admission: RE | Admit: 2016-02-21 | Discharge: 2016-02-21 | Disposition: A | Discharge status: Home / Self Care | Payer: Medicare Other | Source: Ambulatory Visit | Attending: Thoracic Surgery (Cardiothoracic Vascular Surgery) | Admitting: Thoracic Surgery (Cardiothoracic Vascular Surgery)

## 2016-02-21 VITALS — BP 120/78 | HR 110 | Resp 20 | Ht 64.0 in | Wt 123.0 lb

## 2016-02-21 DIAGNOSIS — Z9889 Other specified postprocedural states: Secondary | ICD-10-CM

## 2016-02-21 DIAGNOSIS — I340 Nonrheumatic mitral (valve) insufficiency: Secondary | ICD-10-CM

## 2016-02-21 DIAGNOSIS — I341 Nonrheumatic mitral (valve) prolapse: Secondary | ICD-10-CM

## 2016-02-21 MED ORDER — METOPROLOL TARTRATE 25 MG PO TABS: 12.5000 mg | ORAL_TABLET | Freq: Two times a day (BID) | ORAL | 1 refills | Status: DC

## 2016-02-21 NOTE — Patient Instructions (Signed)
You may continue to gradually increase your physical activity as tolerated.  Refrain from any heavy lifting or strenuous use of your arms and shoulders until at least 8 weeks from the time of your surgery, and avoid activities that cause increased pain in your chest on the side of your surgical incision.  Otherwise you may continue to increase activities without any particular limitations.  Increase the intensity and duration of physical activity gradually.  You may return to driving an automobile as long as you are no longer requiring oral narcotic pain relievers during the daytime.  It would be wise to start driving only short distances during the daylight and gradually increase from there as you feel comfortable.  You are encouraged to enroll and participate in the outpatient cardiac rehab program beginning as soon as practical.  Begin taking metoprolol as directed

## 2016-02-21 NOTE — Progress Notes (Signed)
[address]       [place] [address]             [phone]     CARDIOTHORACIC SURGERY OFFICE NOTE  Referring Provider is [name], Wonda Cheng, MD PCP is Marjorie Smolder, MD   HPI:  Patient is a 70 year old female who returns to the office for routine follow-up today status post minimally invasive mitral valve repair and closure of patent foramen ovale on 02/01/2016. The patient's early postoperative recovery in the hospital was uncomplicated and she was discharged home on the sixth postoperative day. Since hospital discharge the patient has had her prothrombin time checked and Coumadin dose adjusted through the Coumadin clinic.  Her most recent INR was measured 1.8 on 02/17/2016.  She was seen in follow-up by Angelena Form at [facility] on 02/17/2016.  EKG performed at that time revealed sinus tachycardia with heart rate 110.  No changes in her medications were made at that time.  The patient returns for office today with her daughter present for follow-up. She has been doing quite well. She walks at least a mile twice daily and has been making steady progress. She has no exertional shortness of breath. Her only complaint is that of pain related to her surgery that has been located in the right upper back near her scapula. The pain is exacerbated by movement of the right arm and shoulder. The pain is usually easily controlled with Tylenol. She has not been taking any other pain relievers. Her appetite is improved. She has no shortness of breath. She has no productive cough. Her energy level continues to improve. Overall she is pleased with her progress.   Current Outpatient Prescriptions  Medication Sig Dispense Refill  . acetaminophen (TYLENOL) 325 MG tablet Take 650 mg by mouth every 6 (six) hours as needed.    Marland Kitchen aspirin 81 MG tablet Take 81 mg by mouth daily.    . calcium citrate-vitamin D (CITRACAL+D) 315-200 MG-UNIT tablet Take 1 tablet by mouth 3 (three)  times daily. VITAMIN D DOSAGE IS 250MG  INSTEAD OF 200    . diclofenac sodium (VOLTAREN) 1 % GEL Apply 2 g topically as needed (back pain).    . traMADol (ULTRAM) 50 MG tablet Take 1 tablet (50 mg total) by mouth every 6 (six) hours as needed for moderate pain. 30 tablet 0  . warfarin (COUMADIN) 2.5 MG tablet Take 1 tablet (2.5 mg total) by mouth daily at 6 PM. Or as directed. 30 tablet 1   No current facility-administered medications for this visit.       Physical Exam:   BP 120/78 (BP Location: Right Arm, Patient Position: Sitting, Cuff Size: Normal)   Pulse (!) 110   Resp 20   Ht 5\' 4"  (1.626 m)   Wt 123 lb (55.8 kg)   SpO2 96% Comment: RA  BMI 21.11 kg/m   General:  Well appearing  Chest:   Clear to auscultation  CV:   Regular rate and rhythm without murmur  Incisions:  Healing nicely, chest tube sutures are removed  Abdomen:  Soft nontender  Extremities:  Warm and well-perfused  Diagnostic Tests:  CHEST  2 VIEW  COMPARISON:  02/07/2016.  FINDINGS: RIGHT pleural effusion is decreased but not yet resolved. LEFT pleural effusion is essentially resolved. Mitral valve prosthesis overlies the spine. Cardiac size upper limits normal. No pneumothorax. No infiltrates or failure. No acute osseous findings although a small cervical rib is seen. Cholecystectomy.  IMPRESSION: Improving aeration but with small RIGHT pleural effusion residua. No active infiltrates or failure.   Electronically Signed   By: Staci Righter M.D.   On: 02/21/2016 16:14   Impression:  Patient is doing very well less than 3 weeks status post minimally invasive mitral valve repair.  Plan:  I have given the patient prescription for metoprolol 12.5 mg by mouth twice daily. The patient was not discharged from the hospital on a beta blocker because of bradycardia during the first couple days after surgery. I have encouraged the patient to continue to gradually increase her physical activity as  tolerated. I think she may resume driving an automobile. I have encouraged her to enroll and participate in outpatient cardiac rehabilitation program. All of her questions have been addressed. The patient will return to our office for routine follow-up in approximately 6 weeks.   Valentina Gu. Roxy Manns, MD 02/21/2016 4:33 PM

## 2016-02-24 ENCOUNTER — Ambulatory Visit (INDEPENDENT_AMBULATORY_CARE_PROVIDER_SITE_OTHER): Payer: Medicare Other | Admitting: Pharmacist

## 2016-02-24 DIAGNOSIS — Z9889 Other specified postprocedural states: Secondary | ICD-10-CM

## 2016-02-24 DIAGNOSIS — Z7901 Long term (current) use of anticoagulants: Secondary | ICD-10-CM | POA: Diagnosis not present

## 2016-02-24 LAB — POCT INR: INR: 1.6

## 2016-03-02 ENCOUNTER — Ambulatory Visit (INDEPENDENT_AMBULATORY_CARE_PROVIDER_SITE_OTHER): Payer: Medicare Other | Admitting: Pharmacist

## 2016-03-02 ENCOUNTER — Encounter (INDEPENDENT_AMBULATORY_CARE_PROVIDER_SITE_OTHER): Payer: Self-pay

## 2016-03-02 DIAGNOSIS — Z7901 Long term (current) use of anticoagulants: Secondary | ICD-10-CM

## 2016-03-02 DIAGNOSIS — Z9889 Other specified postprocedural states: Secondary | ICD-10-CM | POA: Diagnosis not present

## 2016-03-02 LAB — POCT INR: INR: 1.8

## 2016-03-05 ENCOUNTER — Other Ambulatory Visit: Payer: Self-pay | Admitting: *Deleted

## 2016-03-05 ENCOUNTER — Encounter: Payer: Self-pay | Admitting: Cardiovascular Disease

## 2016-03-05 MED ORDER — WARFARIN SODIUM 2.5 MG PO TABS: 2.5000 mg | ORAL_TABLET | Freq: Every day | ORAL | 3 refills | Status: DC

## 2016-03-09 ENCOUNTER — Ambulatory Visit (INDEPENDENT_AMBULATORY_CARE_PROVIDER_SITE_OTHER): Payer: Medicare Other | Admitting: Pharmacist Clinician (PhC)/ Clinical Pharmacy Specialist

## 2016-03-09 DIAGNOSIS — Z7901 Long term (current) use of anticoagulants: Secondary | ICD-10-CM | POA: Diagnosis not present

## 2016-03-09 DIAGNOSIS — Z9889 Other specified postprocedural states: Secondary | ICD-10-CM | POA: Diagnosis not present

## 2016-03-09 LAB — POCT INR: INR: 2.1

## 2016-03-09 MED ORDER — WARFARIN SODIUM 2.5 MG PO TABS: ORAL_TABLET | ORAL | 3 refills | Status: DC

## 2016-03-16 ENCOUNTER — Ambulatory Visit (INDEPENDENT_AMBULATORY_CARE_PROVIDER_SITE_OTHER): Payer: Medicare Other | Admitting: Physician Assistant

## 2016-03-16 ENCOUNTER — Ambulatory Visit (INDEPENDENT_AMBULATORY_CARE_PROVIDER_SITE_OTHER): Payer: Medicare Other | Admitting: *Deleted

## 2016-03-16 ENCOUNTER — Encounter: Payer: Self-pay | Admitting: Physician Assistant

## 2016-03-16 VITALS — BP 122/82 | HR 102 | Ht 64.0 in | Wt 123.8 lb

## 2016-03-16 DIAGNOSIS — Z9889 Other specified postprocedural states: Secondary | ICD-10-CM

## 2016-03-16 DIAGNOSIS — I341 Nonrheumatic mitral (valve) prolapse: Secondary | ICD-10-CM

## 2016-03-16 DIAGNOSIS — Z7901 Long term (current) use of anticoagulants: Secondary | ICD-10-CM

## 2016-03-16 DIAGNOSIS — I340 Nonrheumatic mitral (valve) insufficiency: Secondary | ICD-10-CM | POA: Diagnosis not present

## 2016-03-16 DIAGNOSIS — R000 Tachycardia, unspecified: Secondary | ICD-10-CM

## 2016-03-16 LAB — POCT INR: INR: 1.8

## 2016-03-16 NOTE — Progress Notes (Signed)
Cardiology Office Note    Date:  03/16/2016   ID:  GERARDO GIAMMARINO, DOB [birth year], MRN [id number]  PCP:  Marjorie Smolder, MD  Cardiologist:  Dr. Acie Fredrickson  Chief Complaint: Heart racing  History of Present Illness:   Destiny Andersen is a 70 y.o. female with a history of mitral valve prolapse with severe MR s/p minimally invasive mitral valve repair and PFO closure who walked in for heart racing.  The patient reportedly was told that she had mitral valve prolapse many years ago. Approximately 3 years ago she was noted by her primary care physician to have a prominent murmur on physical exam and echocardiogram revealed the presence of mitral valve prolapse with severe mitral regurgitation. She has been followed carefully by Dr. Acie Fredrickson ever since. She has remained physically active and reportedly asymptomatic. She was seen in follow-up recently by Dr. Acie Fredrickson and transthoracic echocardiogram revealed mitral valve prolapse with a flail segment of the posterior leaflet of mitral valve and severe mitral regurgitation. Left ventricular size and systolic function remains normal. Transesophageal echocardiogram was performed, confirming the presence of a flail segment of the posterior leaflet with ruptured chordae tendineae. L/RHC on 12/21/15 showed normal coronary arteries, normal LV function and severe MR. The patient was referred for surgical consultation.   She ultimately underwent minimally invasive mitral valve repair and PFO closure with Dr. Roxy Manns on 02/01/16. Her post op course was c/b post op thrombocytopenia and volume overload requiring diuresis. The patient was not discharged from the hospital on a beta blocker because of bradycardia during the first couple days after surgery.  Last seen in clinic 02/17/16 for  for follow up. She has been feeling pretty good since the surgery except her back hurts. EKG showed sinus tachycardia at rate of 103 bpm.  The patient was started on metorpolol 12.5mg  BID  due to tachycardia by Dr. Clydene Laming 02/21/16.   Came for INR check and while walking back to car in parking lot she noted HR of 110 and came back for further evaluation. The patient denies nausea, vomiting, fever, chest pain, palpitations, shortness of breath, orthopnea, PND, dizziness, syncope, cough, congestion, abdominal pain, hematochezia, melena, lower extremity edema. Had one episode of throat sensation this morning.   Past Medical History:  Diagnosis Date  . Cancer [facility]) 1983   Cervical cancer.  Skin Cancer- leg  . Chronic headaches    "? Migraines"- does not have then any more  . Constipation   . Elevated C-reactive protein (CRP)   . Family history of breast cancer    Mother  . Fatigue   . Heart murmur    ECHO 02/10/13 EF = 60-65%, moderate posterior mitral valve prolapse w/possible flail segment, severe MR, mitral regurgitant jet is anteriorly directed.  Marland Kitchen History of blood transfusion    pre-eclampsia   . Hyperpotassemia    Migrated  . Medicare annual wellness visit, subsequent   . Mitral valve prolapse   . MR (mitral regurgitation)    ECHO 02/10/13 EF = 60-65%, moderate posterior mitral valve prolapse w/possible flail segment, severe MR, mitral regurgitant jet is anteriorly directed.  . Risk for falls   . Routine general medical examination at a health care facility   . S/P minimally invasive mitral valve repair 02/01/2016   Complex valvuloplasty including triangular resection of flail posterior leaflet, artificial Gore-tex neochord placement x10 and 28 mm Sorin Memo 3D ring annuloplasty via right mini thoracotomy approach  . Seizures ([place])    with pre-  echa  . Shortness of breath dyspnea    with exertion    Past Surgical History:  Procedure Laterality Date  . BREAST BIOPSY Right 1998   benign cyst  . CARDIAC CATHETERIZATION N/A 12/21/2015   Procedure: Right/Left Heart Cath and Coronary Angiography;  Surgeon: Sherren Mocha, MD;  Location: [place] [facility];  Service:  Cardiovascular;  Laterality: N/A;  . COLONOSCOPY  08/2001   Colon cancer screening in 01/2012  . LAPAROSCOPIC CHOLECYSTECTOMY  1990  . MITRAL VALVE REPAIR Right 02/01/2016   Procedure: MINIMALLY INVASIVE MITRAL VALVE REPAIR (MVR) with closure of PFO;  Surgeon: Rexene Alberts, MD;  Location: [place];  Service: Open Heart Surgery;  Laterality: Right;  . TEE WITHOUT CARDIOVERSION N/A 10/14/2015   Procedure: TRANSESOPHAGEAL ECHOCARDIOGRAM (TEE);  Surgeon: Thayer Headings, MD;  Location: [place];  Service: Cardiovascular;  Laterality: N/A;  . TEE WITHOUT CARDIOVERSION N/A 02/01/2016   Procedure: TRANSESOPHAGEAL ECHOCARDIOGRAM (TEE);  Surgeon: Rexene Alberts, MD;  Location: [place];  Service: Open Heart Surgery;  Laterality: N/A;  . TOTAL ABDOMINAL HYSTERECTOMY  1983   Cervical cancer 1983    Current Medications: Prior to Admission medications   Medication Sig Start Date End Date Taking? Authorizing Provider  acetaminophen (TYLENOL) 325 MG tablet Take 650 mg by mouth every 6 (six) hours as needed.    Historical Provider, MD  aspirin 81 MG tablet Take 81 mg by mouth daily.    Historical Provider, MD  calcium citrate-vitamin D (CITRACAL+D) 315-200 MG-UNIT tablet Take 1 tablet by mouth 3 (three) times daily. VITAMIN D DOSAGE IS 250MG  INSTEAD OF 200    Historical Provider, MD  diclofenac sodium (VOLTAREN) 1 % GEL Apply 2 g topically as needed (back pain). 02/07/16   Donielle Liston Alba, PA-C  metoprolol tartrate (LOPRESSOR) 25 MG tablet Take 0.5 tablets (12.5 mg total) by mouth 2 (two) times daily. 02/21/16   Rexene Alberts, MD  traMADol (ULTRAM) 50 MG tablet Take 1 tablet (50 mg total) by mouth every 6 (six) hours as needed for moderate pain. 02/07/16   Donielle Liston Alba, PA-C  warfarin (COUMADIN) 2.5 MG tablet Take 1 to 1.5 tablets by mouth daily as directed by coumadin clinic 03/09/16   Thayer Headings, MD    Allergies:   Penicillins; Codeine; Doxycycline hyclate; and Ultram [tramadol]   Social  History   Social History  . Marital status: Married    Spouse name: N/A  . Number of children: 1  . Years of education: N/A   Social History Main Topics  . Smoking status: Former Smoker    Years: 25.00    Types: Cigarettes    Quit date: 06/18/1985  . Smokeless tobacco: Never Used  . Alcohol use 1.0 oz/week    2 Standard drinks or equivalent per week     Comment: rare  . Drug use: No  . Sexual activity: No   Other Topics Concern  . None   Social History Narrative  . None     Family History:  The patient's family history includes AAA (abdominal aortic aneurysm) in her father; Breast cancer in her mother; CAD in her father and mother; Heart disease in her father and mother; High Cholesterol in her brother; Hypertension in her brother, father, and mother; Skin cancer in her brother.   ROS:   Please see the history of present illness.    ROS All other systems reviewed and are negative.   PHYSICAL EXAM:   VS:  BP 122/82   Pulse (!) 102   Ht 5\' 4"  (1.626 m)   Wt 123 lb 12.8 oz (56.2 kg)   BMI 21.25 kg/m    GEN: thin anxious female in no acute distress  HEENT: normal  Neck: no JVD, carotid bruits, or masses Cardiac: RRR; no murmurs, rubs, or gallops,no edema  Respiratory:  clear to auscultation bilaterally, normal work of breathing GI: soft, nontender, nondistended, + BS MS: no deformity or atrophy  Skin: warm and dry, no rash Neuro:  Alert and Oriented x 3, Strength and sensation are intact Psych: euthymic mood, full affect  Wt Readings from Last 3 Encounters:  03/16/16 123 lb 12.8 oz (56.2 kg)  02/21/16 123 lb (55.8 kg)  02/17/16 123 lb 12.8 oz (56.2 kg)      Studies/Labs Reviewed:   EKG:  EKG is ordered today.  The ekg ordered today demonstrates sinus tachycardia at rate of 102 bpm.   Recent Labs: 01/30/2016: ALT 17 02/02/2016: Magnesium 2.2 02/04/2016: Hemoglobin 9.5; Platelets 112 02/05/2016: BUN 9; Creatinine, Ser 0.89; Potassium 4.4; Sodium 141   Lipid  Panel No results found for: CHOL, TRIG, HDL, CHOLHDL, VLDL, LDLCALC, LDLDIRECT  Additional studies/ records that were reviewed today include:   As above  ASSESSMENT & PLAN:   1. Sinus tachycardia - She wear fitbit. Reviewed HR graph. Seems HR in high 70s to low 90s. One episode of rate in 113. She is asymptomatic. EKG reassuring. She seems very anxious.  Reassurance given. Will continue current dose of metoprolol 12.5mg  BID (does not wants to increase currently). She will let us know if constant tachycardia and will increase BB dose. Might consider 24 holter to get baseline rate if ongoing issue if no improvement after increase dosage.   2. Mitral valve prolapse with severe MR s/p minimally invasive mitral valve repair and PFO closure: doing well. Continue coumadin for now.     Medication Adjustments/Labs and Tests Ordered: Current medicines are reviewed at length with the patient today.  Concerns regarding medicines are outlined above.  Medication changes, Labs and Tests ordered today are listed in the Patient Instructions below. Patient Instructions  Medication Instructions:  Your physician recommends that you continue on your current medications as directed. Please refer to the Current Medication list given to you today.   Labwork: None ordered  Testing/Procedures: None ordered  Follow-Up: Your physician recommends that you schedule a follow-up appointment in: SEE DR. [name] AS PLANNED   Any Other Special Instructions Will Be Listed Below (If Applicable).     If you need a refill on your cardiac medications before your next appointment, please call your pharmacy.      Jarrett Soho, [place]  03/16/2016 3:18 PM    [facility] [address], [place], [place]  [address] Phone: [phone]; Fax: [phone]

## 2016-03-16 NOTE — Patient Instructions (Signed)
Medication Instructions:  Your physician recommends that you continue on your current medications as directed. Please refer to the Current Medication list given to you today.   Labwork: None ordered  Testing/Procedures: None ordered  Follow-Up: Your physician recommends that you schedule a follow-up appointment in: SEE DR. [name] AS PLANNED   Any Other Special Instructions Will Be Listed Below (If Applicable).     If you need a refill on your cardiac medications before your next appointment, please call your pharmacy.

## 2016-03-19 NOTE — Addendum Note (Signed)
Addended by: Leanord Asal T on: 03/19/2016 03:53 PM   Modules accepted: Orders

## 2016-03-23 ENCOUNTER — Ambulatory Visit (INDEPENDENT_AMBULATORY_CARE_PROVIDER_SITE_OTHER): Payer: Medicare Other | Admitting: *Deleted

## 2016-03-23 DIAGNOSIS — Z9889 Other specified postprocedural states: Secondary | ICD-10-CM

## 2016-03-23 DIAGNOSIS — Z7901 Long term (current) use of anticoagulants: Secondary | ICD-10-CM

## 2016-03-23 LAB — POCT INR: INR: 2.1

## 2016-03-30 ENCOUNTER — Ambulatory Visit (INDEPENDENT_AMBULATORY_CARE_PROVIDER_SITE_OTHER): Payer: Medicare Other | Admitting: Pharmacist

## 2016-03-30 DIAGNOSIS — Z9889 Other specified postprocedural states: Secondary | ICD-10-CM | POA: Diagnosis not present

## 2016-03-30 DIAGNOSIS — Z7901 Long term (current) use of anticoagulants: Secondary | ICD-10-CM | POA: Diagnosis not present

## 2016-03-30 LAB — POCT INR: INR: 2

## 2016-04-02 ENCOUNTER — Encounter: Payer: Medicare Other | Admitting: Thoracic Surgery (Cardiothoracic Vascular Surgery)

## 2016-04-09 ENCOUNTER — Ambulatory Visit (INDEPENDENT_AMBULATORY_CARE_PROVIDER_SITE_OTHER): Payer: Medicare Other | Admitting: Cardiovascular Disease

## 2016-04-09 ENCOUNTER — Encounter: Payer: Self-pay | Admitting: Thoracic Surgery (Cardiothoracic Vascular Surgery)

## 2016-04-09 ENCOUNTER — Ambulatory Visit (INDEPENDENT_AMBULATORY_CARE_PROVIDER_SITE_OTHER): Payer: Self-pay | Admitting: Thoracic Surgery (Cardiothoracic Vascular Surgery)

## 2016-04-09 ENCOUNTER — Ambulatory Visit (INDEPENDENT_AMBULATORY_CARE_PROVIDER_SITE_OTHER): Payer: Medicare Other | Admitting: *Deleted

## 2016-04-09 ENCOUNTER — Encounter: Payer: Self-pay | Admitting: Cardiovascular Disease

## 2016-04-09 VITALS — BP 125/85 | HR 100 | Resp 16 | Ht 64.0 in | Wt 123.0 lb

## 2016-04-09 VITALS — BP 100/70 | HR 96 | Ht 64.0 in | Wt 124.4 lb

## 2016-04-09 DIAGNOSIS — Z7901 Long term (current) use of anticoagulants: Secondary | ICD-10-CM

## 2016-04-09 DIAGNOSIS — Z9889 Other specified postprocedural states: Secondary | ICD-10-CM

## 2016-04-09 DIAGNOSIS — I340 Nonrheumatic mitral (valve) insufficiency: Secondary | ICD-10-CM | POA: Diagnosis not present

## 2016-04-09 DIAGNOSIS — I341 Nonrheumatic mitral (valve) prolapse: Secondary | ICD-10-CM

## 2016-04-09 LAB — POCT INR: INR: 1.8

## 2016-04-09 MED ORDER — METOPROLOL TARTRATE 25 MG PO TABS: ORAL_TABLET | ORAL | 3 refills | Status: DC

## 2016-04-09 NOTE — Progress Notes (Signed)
Cardiology Office Note   Date:  04/09/2016   ID:  Destiny Andersen, DOB Nov 04, 1945, MRN [id number]  PCP:  Marjorie Smolder, MD  Cardiologist:   Mertie Moores, MD   No chief complaint on file.  1.  MVP /  Severe mitral regurgitation - s/p MV repair 2.      Destiny Andersen is a 70 yo with hx of MVP for years. She has been very healthy and has never had any cardiac symptoms. She works out regularly. She is working with a Clinical research associate as of this year.   October 08, 2013:  Destiny Andersen is doing well. She had an echo recently that revealed severe MR with prolapse of the posterior leaflet. She has slight dyspnea walking up a hill on her usual route but otherwise, she does not have any problems. Works out with a Clinical research associate once a week.   Oct. 20, 2015:  Destiny Andersen is doing well.  She is walking regularly - able to walk her usual route in the mornings - 2 - 3 miles a day. She does not have any episodes of chest pain or shortness of breath. She thinks she is able to walk better now than she was at the first of this year   October 05, 2014:  Destiny Andersen is a 70 y.o. female who presents for follow up of her mitral valve prolapse.   Oct. 26, 2016:  Destiny Andersen is doing well. Still exercising .   Had an episode of abdominal pain / chest pressure in May .  Has felt well since that time Walks 2-3 miles a day without DOE,  No CP   October 10, 2015: Back for eval. Echo this am shows severe MR with flail scallop  Walks her dog without any problems. Climbs her stairs without any problems  No CP , no dypsnea   December 15, 2015:  Destiny Andersen is seen back today for pre-cath visit . She has been seen by Dr. Roxy Manns and the plan is for cardiac catheterization next week in preparation for her mitral valve repair. Still has no CP. Some mild dyspnea ( which is new )  Some DOE walking up the hills.   Oct. 23, 2017:    Destiny Andersen is seen today for follow up of her  MV repair ( Aug. 16, 2017)  Has had some sinus tachy  Several  questions. How long will she take coumadin    Past Medical History:  Diagnosis Date  . Cancer [facility]) 1983   Cervical cancer.  Skin Cancer- leg  . Chronic headaches    "? Migraines"- does not have then any more  . Constipation   . Elevated C-reactive protein (CRP)   . Family history of breast cancer    Mother  . Fatigue   . Heart murmur    ECHO 02/10/13 EF = 60-65%, moderate posterior mitral valve prolapse w/possible flail segment, severe MR, mitral regurgitant jet is anteriorly directed.  Marland Kitchen History of blood transfusion    pre-eclampsia   . Hyperpotassemia    Migrated  . Medicare annual wellness visit, subsequent   . Mitral valve prolapse   . MR (mitral regurgitation)    ECHO 02/10/13 EF = 60-65%, moderate posterior mitral valve prolapse w/possible flail segment, severe MR, mitral regurgitant jet is anteriorly directed.  . Risk for falls   . Routine general medical examination at a health care facility   . S/P minimally invasive mitral valve repair 02/01/2016   Complex valvuloplasty including triangular resection of  flail posterior leaflet, artificial Gore-tex neochord placement x10 and 28 mm Sorin Memo 3D ring annuloplasty via right mini thoracotomy approach  . Seizures ([place])    with pre- echa  . Shortness of breath dyspnea    with exertion    Past Surgical History:  Procedure Laterality Date  . BREAST BIOPSY Right 1998   benign cyst  . CARDIAC CATHETERIZATION N/A 12/21/2015   Procedure: Right/Left Heart Cath and Coronary Angiography;  Surgeon: Sherren Mocha, MD;  Location: [place] [facility];  Service: Cardiovascular;  Laterality: N/A;  . COLONOSCOPY  08/2001   Colon cancer screening in 01/2012  . LAPAROSCOPIC CHOLECYSTECTOMY  1990  . MITRAL VALVE REPAIR Right 02/01/2016   Procedure: MINIMALLY INVASIVE MITRAL VALVE REPAIR (MVR) with closure of PFO;  Surgeon: Rexene Alberts, MD;  Location: [place];  Service: Open Heart Surgery;  Laterality: Right;  . TEE WITHOUT CARDIOVERSION  N/A 10/14/2015   Procedure: TRANSESOPHAGEAL ECHOCARDIOGRAM (TEE);  Surgeon: Thayer Headings, MD;  Location: [place];  Service: Cardiovascular;  Laterality: N/A;  . TEE WITHOUT CARDIOVERSION N/A 02/01/2016   Procedure: TRANSESOPHAGEAL ECHOCARDIOGRAM (TEE);  Surgeon: Rexene Alberts, MD;  Location: [place];  Service: Open Heart Surgery;  Laterality: N/A;  . TOTAL ABDOMINAL HYSTERECTOMY  1983   Cervical cancer 1983     Current Outpatient Prescriptions  Medication Sig Dispense Refill  . aspirin 81 MG tablet Take 81 mg by mouth daily.    . calcium citrate-vitamin D (CITRACAL+D) 315-200 MG-UNIT tablet Take 1 tablet by mouth 3 (three) times daily. VITAMIN D DOSAGE IS 250MG  INSTEAD OF 200    . metoprolol tartrate (LOPRESSOR) 25 MG tablet Take 0.5 tablets (12.5 mg total) by mouth 2 (two) times daily. 60 tablet 1  . warfarin (COUMADIN) 2.5 MG tablet Take 1 to 1.5 tablets by mouth daily as directed by coumadin clinic 45 tablet 3   No current facility-administered medications for this visit.     Allergies:   Penicillins; Codeine; Doxycycline hyclate; and Ultram [tramadol]    Social History:  The patient  reports that she quit smoking about 30 years ago. Her smoking use included Cigarettes. She quit after 25.00 years of use. She has never used smokeless tobacco. She reports that she drinks about 1.0 oz of alcohol per week . She reports that she does not use drugs.   Family History:  The patient's family history includes AAA (abdominal aortic aneurysm) in her father; Breast cancer in her mother; CAD in her father and mother; Heart disease in her father and mother; High Cholesterol in her brother; Hypertension in her brother, father, and mother; Skin cancer in her brother.    ROS:  Please see the history of present illness.    Review of Systems: Constitutional:  denies fever, chills, diaphoresis, appetite change and fatigue.  HEENT: denies photophobia, eye pain, redness, hearing loss, ear pain,  congestion, sore throat, rhinorrhea, sneezing, neck pain, neck stiffness and tinnitus.  Respiratory: denies SOB, DOE, cough, chest tightness, and wheezing.  Cardiovascular: denies chest pain, palpitations and leg swelling.  Gastrointestinal: denies nausea, vomiting, abdominal pain, diarrhea, constipation, blood in stool.  Genitourinary: denies dysuria, urgency, frequency, hematuria, flank pain and difficulty urinating.  Musculoskeletal: denies  myalgias, back pain, joint swelling, arthralgias and gait problem.   Skin: denies pallor, rash and wound.  Neurological: denies dizziness, seizures, syncope, weakness, light-headedness, numbness and headaches.   Hematological: denies adenopathy, easy bruising, personal or family bleeding history.  Psychiatric/ Behavioral: denies suicidal ideation, mood  changes, confusion, nervousness, sleep disturbance and agitation.       All other systems are reviewed and negative.    PHYSICAL EXAM: VS:  BP 100/70 (BP Location: Right Arm, Patient Position: Sitting, Cuff Size: Normal)   Pulse 96   Ht 5\' 4"  (1.626 m)   Wt 124 lb 6.4 oz (56.4 kg)   BMI 21.35 kg/m  , BMI Body mass index is 21.35 kg/m. GEN: Well nourished, well developed, in no acute distress  HEENT: normal  Neck: no JVD, carotid bruits, or masses Cardiac: RRR, tachy No  rubs, or gallops,no edema , Rib tenderness below the left breast.  Respiratory:  clear to auscultation bilaterally, normal work of breathing GI: soft, nontender, nondistended, + BS,  + pulsitile abdominal mass,  MS: no deformity or atrophy  Skin: warm and dry, no rash Neuro:  Strength and sensation are intact Psych: normal   EKG:  EKG is ordered today. NSR at 76.  Normal ECG   Recent Labs: 01/30/2016: ALT 17 02/02/2016: Magnesium 2.2 02/04/2016: Hemoglobin 9.5; Platelets 112 02/05/2016: BUN 9; Creatinine, Ser 0.89; Potassium 4.4; Sodium 141    Lipid Panel No results found for: CHOL, TRIG, HDL, CHOLHDL, VLDL,  LDLCALC, LDLDIRECT    Wt Readings from Last 3 Encounters:  04/09/16 124 lb 6.4 oz (56.4 kg)  03/16/16 123 lb 12.8 oz (56.2 kg)  02/21/16 123 lb (55.8 kg)      Other studies Reviewed: Additional studies/ records that were reviewed today include: . Review of the above records demonstrates:    ASSESSMENT AND PLAN:  1.  Mitral regurgitation:   S/p MV repair. Overall doing well. Remains tachycardic She's on low-dose metoprolol. We will increase the metoprolol to 12.5 mg in the morning with 25 mg at night. I'll see her in several months for a follow-up visit.Marland Kitchen   3. pulsitile abdominal aorta Has a hx of MVP.  Abdominal duplex shows no evidence of AAA   Current medicines are reviewed at length with the patient today.  The patient does not have concerns regarding medicines.  The following changes have been made:  no change  Labs/ tests ordered today include:  No orders of the defined types were placed in this encounter.   Signed, Mertie Moores, MD  04/09/2016 9:56 AM    [facility] [address], [place], [place]  [address] Phone: [phone]; Fax: [phone]

## 2016-04-09 NOTE — Patient Instructions (Signed)
Medication Instructions:  INCREASE Metoprolol to 12.5 mg in the morning and 25 mg in the evening   Labwork: None Ordered   Testing/Procedures: None Ordered   Follow-Up: Your physician recommends that you schedule a follow-up appointment in: 2-3 months   If you need a refill on your cardiac medications before your next appointment, please call your pharmacy.   Thank you for choosing [facility]! Christen Bame, RN [phone]

## 2016-04-09 NOTE — Progress Notes (Signed)
      [address]       [place] [address]             [phone]     CARDIOTHORACIC SURGERY OFFICE NOTE  Referring Provider is [name], Wonda Cheng, MD PCP is Marjorie Smolder, MD   HPI:  Patient is a 70 year old female who returns to the office for routine follow-up status post minimally invasive mitral valve repair on 02/01/2016. Her postoperative recovery has been uneventful. She was last seen here in our office on 02/21/2016 at which time she was doing well. She was seen in follow-up at [facility] on 03/16/2016 at which time her dose of metoprolol was increased.  Since then she has continued to do remarkably well. She reports no problems whatsoever. She has mild residual soreness across her right chest and underneath her right breast. She does not take any sort of pain relievers. She has no shortness of breath and she notes that her breathing is better than it was prior to surgery. She is quite active physically and walks every day. She reports no limitations. She is delighted with her progress. She remains chronically anticoagulated using warfarin.     Current Outpatient Prescriptions  Medication Sig Dispense Refill  . aspirin 81 MG tablet Take 81 mg by mouth daily.    . calcium citrate-vitamin D (CITRACAL+D) 315-200 MG-UNIT tablet Take 1 tablet by mouth 3 (three) times daily. VITAMIN D DOSAGE IS 250MG  INSTEAD OF 200    . metoprolol tartrate (LOPRESSOR) 25 MG tablet Take 1/2 tab (12.5 mg) in the morning and 1 tab (25 mg) in the evening 135 tablet 3  . warfarin (COUMADIN) 2.5 MG tablet Take 1 to 1.5 tablets by mouth daily as directed by coumadin clinic 45 tablet 3   No current facility-administered medications for this visit.       Physical Exam:   BP 125/85   Pulse 100   Resp 16   Ht 5\' 4"  (1.626 m)   Wt 123 lb (55.8 kg)   SpO2 98% Comment: ON RA  BMI 21.11 kg/m   General:  Well-appearing  Chest:   Clear to auscultation  CV:   Regular rate and rhythm  without murmur  Incisions:  Well-healed  Abdomen:  Soft nontender  Extremities:  Warm and well-perfused  Diagnostic Tests:  n/a  Impression:  Patient is doing very well approximately 2 months following minimally invasive mitral valve repair.  Plan:  I have encouraged the patient to continue to gradually increase her physical activity as tolerated without any particular limitations at this time. We have not recommended any changes to her current medications. I've instructed her that she may stop taking warfarin in approximately 3 months after her surgery, oral proximally one month from now. She will remain on low-dose aspirin after she stops taking warfarin.  I have answered all of her questions about physical activity and limitations.  The patient has been reminded regarding the importance of dental hygiene and the lifelong need for antibiotic prophylaxis for all dental cleanings and other related invasive procedures.  She will return to our office for routine follow-up next August, approximately 1 year following her original surgery. She will call and return sooner should specific problems or questions arise.   Valentina Gu. Roxy Manns, MD 04/09/2016 12:40 PM

## 2016-04-09 NOTE — Patient Instructions (Signed)
Continue all previous medications without any changes at this time  Stop Coumadin in 1 month  Endocarditis is a potentially serious infection of heart valves or inside lining of the heart.  It occurs more commonly in patients with diseased heart valves (such as patient's with aortic or mitral valve disease) and in patients who have undergone heart valve repair or replacement.  Certain surgical and dental procedures may put you at risk, such as dental cleaning, other dental procedures, or any surgery involving the respiratory, urinary, gastrointestinal tract, gallbladder or prostate gland.   To minimize your chances for develooping endocarditis, maintain good oral health and seek prompt medical attention for any infections involving the mouth, teeth, gums, skin or urinary tract.    Always notify your doctor or dentist about your underlying heart valve condition before having any invasive procedures. You will need to take antibiotics before certain procedures, including all routine dental cleanings or other dental procedures.  Your cardiologist or dentist should prescribe these antibiotics for you to be taken ahead of time.

## 2016-04-17 ENCOUNTER — Encounter (HOSPITAL_COMMUNITY)
Admission: RE | Admit: 2016-04-17 | Discharge: 2016-04-17 | Disposition: A | Discharge status: Home / Self Care | Payer: Medicare Other | Source: Ambulatory Visit | Attending: Cardiovascular Disease | Admitting: Cardiovascular Disease

## 2016-04-17 VITALS — BP 109/73 | HR 97 | Ht 65.0 in | Wt 124.6 lb

## 2016-04-17 DIAGNOSIS — I340 Nonrheumatic mitral (valve) insufficiency: Secondary | ICD-10-CM | POA: Insufficient documentation

## 2016-04-17 DIAGNOSIS — Z9889 Other specified postprocedural states: Secondary | ICD-10-CM | POA: Diagnosis not present

## 2016-04-17 NOTE — Progress Notes (Signed)
Cardiac Rehab Medication Review by a Pharmacist  Does the patient  feel that his/her medications are working for him/her? yes  Has the patient been experiencing any side effects to the medications prescribed?  no  Does the patient measure his/her own blood pressure or blood glucose at home? No, but has BP cuff at home and can measure if needed.   Does the patient have any problems obtaining medications due to transportation or finances? no  Understanding of regimen: excellent  Understanding of indications: excellent Potential of compliance: excellent   Pharmacist comments:  Patient presents ambulating unassisted and in good spirits. Home medications, allergies, and pharmacy of choice reviewed with patient. No changes to medication. Patient reports some odd sensation of head moving forward when she stops from ambulating. She reports she never feels dizzy or lightheaded. I suggested this was unlikely due to any medication she is taking, especially if she does not have lightheaded or dizziness, which she denies. I encouraged her to ask her PCP or surgeon at follow-up if this continues. She reports she is not concerned about falling. Time allotted for discussion regarding medication-related concerns. Patient reports no further questions or concerns at this time.   Argie Ramming, PharmD Pharmacy Resident  Pager [phone] 04/17/16 3:03 PM

## 2016-04-19 ENCOUNTER — Encounter (HOSPITAL_COMMUNITY): Payer: Self-pay

## 2016-04-19 ENCOUNTER — Other Ambulatory Visit: Payer: Self-pay | Admitting: Family Medicine

## 2016-04-19 DIAGNOSIS — Z1231 Encounter for screening mammogram for malignant neoplasm of breast: Secondary | ICD-10-CM

## 2016-04-19 NOTE — Progress Notes (Signed)
Cardiac Individual Treatment Plan  Patient Details  Name: Destiny Andersen MRN: [id number] Date of Birth: 05/09/1946 Referring Provider:   Flowsheet Row CARDIAC REHAB PHASE II ORIENTATION from 04/17/2016 in [place]  Referring Provider  [name], Arnette Norris, MD      Initial Encounter Date:  Flowsheet Row CARDIAC REHAB PHASE II ORIENTATION from 04/17/2016 in [place]  Date  04/17/16  Referring Provider  [name], Arnette Norris, MD      Visit Diagnosis: S/P mitral valve repair  Patient's Home Medications on Admission:  Current Outpatient Prescriptions:  .  aspirin 81 MG tablet, Take 81 mg by mouth daily., Disp: , Rfl:  .  calcium citrate-vitamin D (CITRACAL+D) 315-200 MG-UNIT tablet, Take 1 tablet by mouth 3 (three) times daily. VITAMIN D DOSAGE IS 250MG  INSTEAD OF 200, Disp: , Rfl:  .  metoprolol tartrate (LOPRESSOR) 25 MG tablet, Take 1/2 tab (12.5 mg) in the morning and 1 tab (25 mg) in the evening, Disp: 135 tablet, Rfl: 3 .  warfarin (COUMADIN) 2.5 MG tablet, Take 1 to 1.5 tablets by mouth daily as directed by coumadin clinic, Disp: 45 tablet, Rfl: 3  Past Medical History: Past Medical History:  Diagnosis Date  . Cancer [facility]) 1983   Cervical cancer.  Skin Cancer- leg  . Chronic headaches    "? Migraines"- does not have then any more  . Constipation   . Elevated C-reactive protein (CRP)   . Family history of breast cancer    Mother  . Fatigue   . Heart murmur    ECHO 02/10/13 EF = 60-65%, moderate posterior mitral valve prolapse w/possible flail segment, severe MR, mitral regurgitant jet is anteriorly directed.  Marland Kitchen History of blood transfusion    pre-eclampsia   . Hyperpotassemia    Migrated  . Medicare annual wellness visit, subsequent   . Mitral valve prolapse   . MR (mitral regurgitation)    ECHO 02/10/13 EF = 60-65%, moderate posterior mitral valve prolapse w/possible flail segment, severe MR, mitral regurgitant  jet is anteriorly directed.  . Risk for falls   . Routine general medical examination at a health care facility   . S/P minimally invasive mitral valve repair 02/01/2016   Complex valvuloplasty including triangular resection of flail posterior leaflet, artificial Gore-tex neochord placement x10 and 28 mm Sorin Memo 3D ring annuloplasty via right mini thoracotomy approach  . Seizures ([place])    with pre- echa  . Shortness of breath dyspnea    with exertion    Tobacco Use: History  Smoking Status  . Former Smoker  . Years: 25.00  . Types: Cigarettes  . Quit date: 06/18/1985  Smokeless Tobacco  . Never Used    Labs: Recent Review Flowsheet Data    Labs for ITP Cardiac and Pulmonary Rehab Latest Ref Rng & Units 02/01/2016 02/02/2016 02/02/2016 02/02/2016 02/02/2016   Hemoglobin A1c 4.8 - 5.6 % - - - - -   PHART 7.350 - 7.450 - 7.388 7.428 7.350 -   PCO2ART 35.0 - 45.0 mmHg - 40.9 40.5 40.7 -   HCO3 20.0 - 24.0 mEq/L - 24.8(H) 26.8(H) 22.5 -   TCO2 0 - 100 mmol/L 25 26 28 24 24    ACIDBASEDEF 0.0 - 2.0 mmol/L - - - 3.0(H) -   O2SAT % - 98.0 98.0 97.0 -      Capillary Blood Glucose: Lab Results  Component Value Date   GLUCAP 96 02/03/2016   GLUCAP 85 02/02/2016  GLUCAP 121 (H) 02/02/2016   GLUCAP 136 (H) 02/02/2016   GLUCAP 155 (H) 02/02/2016     Exercise Target Goals: Date: 04/17/16  Exercise Program Goal: Individual exercise prescription set with THRR, safety & activity barriers. Participant demonstrates ability to understand and report RPE using BORG scale, to self-measure pulse accurately, and to acknowledge the importance of the exercise prescription.  Exercise Prescription Goal: Starting with aerobic activity 30 plus minutes a day, 3 days per week for initial exercise prescription. Provide home exercise prescription and guidelines that participant acknowledges understanding prior to discharge.  Activity Barriers & Risk Stratification:     Activity Barriers & Cardiac  Risk Stratification - 04/17/16 1441      Activity Barriers & Cardiac Risk Stratification   Activity Barriers None   Cardiac Risk Stratification Moderate      6 Minute Walk:     6 Minute Walk    Row Name 04/18/16 1636         6 Minute Walk   Phase Initial     Distance 1756 feet     Walk Time 6 minutes     # of Rest Breaks 0     MPH 3.33     METS 3.98     RPE 9     VO2 Peak 13.93     Symptoms No     Resting HR 97 bpm     Resting BP 109/73     Max Ex. HR 118 bpm     Max Ex. BP 112/70     2 Minute Post BP 102/70        Initial Exercise Prescription:     Initial Exercise Prescription - 04/18/16 1600      Date of Initial Exercise RX and Referring Provider   Date 04/17/16   Referring Provider [name], Arnette Norris, MD     Treadmill   MPH 2.8   Grade 1   Minutes 10   METs 3.53     Bike   Level 0.9   Minutes 10   METs 4     NuStep   Level 2   Minutes 10   METs 3     Prescription Details   Frequency (times per week) 3   Duration Progress to 30 minutes of continuous aerobic without signs/symptoms of physical distress     Intensity   THRR 40-80% of Max Heartrate 60-119   Ratings of Perceived Exertion 11-13   Perceived Dyspnea 0-4     Progression   Progression Continue to progress workloads to maintain intensity without signs/symptoms of physical distress.     Resistance Training   Training Prescription Yes   Weight 2lbs.   Reps 10-12      Perform Capillary Blood Glucose checks as needed.  Exercise Prescription Changes:   Exercise Comments:   Discharge Exercise Prescription (Final Exercise Prescription Changes):   Nutrition:  Target Goals: Understanding of nutrition guidelines, daily intake of sodium 1500mg , cholesterol 200mg , calories 30% from fat and 7% or less from saturated fats, daily to have 5 or more servings of fruits and vegetables.  Biometrics:     Pre Biometrics - 04/18/16 1645      Pre Biometrics   Height 5\' 5"  (1.651 m)    Weight 124 lb 9 oz (56.5 kg)   Waist Circumference 27 inches   Hip Circumference 36.25 inches   Waist to Hip Ratio 0.74 %   BMI (Calculated) 20.8   Triceps Skinfold 24 mm   %  Body Fat 21.2 %   Grip Strength 27.5 kg   Flexibility 17.5 in   Single Leg Stand 30 seconds       Nutrition Therapy Plan and Nutrition Goals:     Nutrition Therapy & Goals - 04/18/16 1000      Nutrition Therapy   Diet Therapeutic Lifestyle Changes     Personal Nutrition Goals   Personal Goal #1 Pt to maintain her wt while in [place], educate and counsel regarding individualized specific dietary modifications aiming towards targeted core components such as weight, hypertension, lipid management, diabetes, heart failure and other comorbidities.   Expected Outcomes Short Term Goal: Understand basic principles of dietary content, such as calories, fat, sodium, cholesterol and nutrients.;Long Term Goal: Adherence to prescribed nutrition plan.      Nutrition Discharge: Nutrition Scores:     Nutrition Assessments - 04/18/16 1000      MEDFICTS Scores   Pre Score 13      Nutrition Goals Re-Evaluation:   Psychosocial: Target Goals: Acknowledge presence or absence of depression, maximize coping skills, provide positive support system. Participant is able to verbalize types and ability to use techniques and skills needed for reducing stress and depression.  Initial Review & Psychosocial Screening:     Initial Psych Review & Screening - 04/19/16 [place]? Yes     Barriers   Psychosocial barriers to participate in program There are no identifiable barriers or psychosocial needs.     Screening Interventions   Interventions Encouraged to exercise      Quality of Life Scores:     Quality of Life - 04/17/16 1627      Quality of Life Scores   Health/Function Pre 25.38 %   Socioeconomic Pre 27.5 %    Psych/Spiritual Pre 20.57 %   Family Pre 23.63 %   GLOBAL Pre 24.45 %      PHQ-9: Recent Review Flowsheet Data    There is no flowsheet data to display.      Psychosocial Evaluation and Intervention:   Psychosocial Re-Evaluation:   Vocational Rehabilitation: Provide vocational rehab assistance to qualifying candidates.   Vocational Rehab Evaluation & Intervention:     Vocational Rehab - 04/19/16 1514      Initial Vocational Rehab Evaluation & Intervention   Assessment shows need for Vocational Rehabilitation No  Shalva is retired      Education: Education Goals: Education classes will be provided on a weekly basis, covering required topics. Participant will state understanding/return demonstration of topics presented.  Learning Barriers/Preferences:     Learning Barriers/Preferences - 04/18/16 1643      Learning Barriers/Preferences   Learning Barriers Sight   Learning Preferences Audio;Skilled Demonstration      Education Topics: Count Your Pulse:  -Group instruction provided by verbal instruction, demonstration, patient participation and written materials to support subject.  Instructors address importance of being able to find your pulse and how to count your pulse when at home without a heart monitor.  Patients get hands on experience counting their pulse with staff help and individually.   Heart Attack, Angina, and Risk Factor Modification:  -Group instruction provided by verbal instruction, video, and written materials to support subject.  Instructors address signs and symptoms of angina and heart attacks.    Also discuss risk factors for heart disease and how to make changes to improve heart health risk  factors.   Functional Fitness:  -Group instruction provided by verbal instruction, demonstration, patient participation, and written materials to support subject.  Instructors address safety measures for doing things around the house.  Discuss how to get  up and down off the floor, how to pick things up properly, how to safely get out of a chair without assistance, and balance training.   Meditation and Mindfulness:  -Group instruction provided by verbal instruction, patient participation, and written materials to support subject.  Instructor addresses importance of mindfulness and meditation practice to help reduce stress and improve awareness.  Instructor also leads participants through a meditation exercise.    Stretching for Flexibility and Mobility:  -Group instruction provided by verbal instruction, patient participation, and written materials to support subject.  Instructors lead participants through series of stretches that are designed to increase flexibility thus improving mobility.  These stretches are additional exercise for major muscle groups that are typically performed during regular warm up and cool down.   Hands Only CPR Anytime:  -Group instruction provided by verbal instruction, video, patient participation and written materials to support subject.  Instructors co-teach with AHA video for hands only CPR.  Participants get hands on experience with mannequins.   Nutrition I class: Heart Healthy Eating:  -Group instruction provided by PowerPoint slides, verbal discussion, and written materials to support subject matter. The instructor gives an explanation and review of the Therapeutic Lifestyle Changes diet recommendations, which includes a discussion on lipid goals, dietary fat, sodium, fiber, plant stanol/sterol esters, sugar, and the components of a well-balanced, healthy diet.   Nutrition II class: Lifestyle Skills:  -Group instruction provided by PowerPoint slides, verbal discussion, and written materials to support subject matter. The instructor gives an explanation and review of label reading, grocery shopping for heart health, heart healthy recipe modifications, and ways to make healthier choices when eating  out.   Diabetes Question & Answer:  -Group instruction provided by PowerPoint slides, verbal discussion, and written materials to support subject matter. The instructor gives an explanation and review of diabetes co-morbidities, pre- and post-prandial blood glucose goals, pre-exercise blood glucose goals, signs, symptoms, and treatment of hypoglycemia and hyperglycemia, and foot care basics.   Diabetes Blitz:  -Group instruction provided by PowerPoint slides, verbal discussion, and written materials to support subject matter. The instructor gives an explanation and review of the physiology behind type 1 and type 2 diabetes, diabetes medications and rational behind using different medications, pre- and post-prandial blood glucose recommendations and Hemoglobin A1c goals, diabetes diet, and exercise including blood glucose guidelines for exercising safely.    Portion Distortion:  -Group instruction provided by PowerPoint slides, verbal discussion, written materials, and food models to support subject matter. The instructor gives an explanation of serving size versus portion size, changes in portions sizes over the last 20 years, and what consists of a serving from each food group.   Stress Management:  -Group instruction provided by verbal instruction, video, and written materials to support subject matter.  Instructors review role of stress in heart disease and how to cope with stress positively.     Exercising on Your Own:  -Group instruction provided by verbal instruction, power point, and written materials to support subject.  Instructors discuss benefits of exercise, components of exercise, frequency and intensity of exercise, and end points for exercise.  Also discuss use of nitroglycerin and activating EMS.  Review options of places to exercise outside of rehab.  Review guidelines for sex with heart disease.  Cardiac Drugs I:  -Group instruction provided by verbal instruction and  written materials to support subject.  Instructor reviews cardiac drug classes: antiplatelets, anticoagulants, beta blockers, and statins.  Instructor discusses reasons, side effects, and lifestyle considerations for each drug class.   Cardiac Drugs II:  -Group instruction provided by verbal instruction and written materials to support subject.  Instructor reviews cardiac drug classes: angiotensin converting enzyme inhibitors (ACE-I), angiotensin II receptor blockers (ARBs), nitrates, and calcium channel blockers.  Instructor discusses reasons, side effects, and lifestyle considerations for each drug class.   Anatomy and Physiology of the Circulatory System:  -Group instruction provided by verbal instruction, video, and written materials to support subject.  Reviews functional anatomy of heart, how it relates to various diagnoses, and what role the heart plays in the overall system.   Knowledge Questionnaire Score:     Knowledge Questionnaire Score - 04/17/16 1626      Knowledge Questionnaire Score   Pre Score 22/24      Core Components/Risk Factors/Patient Goals at Admission:     Personal Goals and Risk Factors at Admission - 04/18/16 1632      Core Components/Risk Factors/Patient Goals on Admission   Hypertension Yes   Intervention Provide education on lifestyle modifcations including regular physical activity/exercise, weight management, moderate sodium restriction and increased consumption of fresh fruit, vegetables, and low fat dairy, alcohol moderation, and smoking cessation.;Monitor prescription use compliance.   Expected Outcomes Short Term: Continued assessment and intervention until BP is < 140/7mm HG in hypertensive participants. < 130/77mm HG in hypertensive participants with diabetes, heart failure or chronic kidney disease.;Long Term: Maintenance of blood pressure at goal levels.   Personal Goal Other Yes   Personal Goal Return to previous state of health prior to  surgery.   Intervention Develop an individualized exercise program including stretching, aerobic and resistance training program to help improve functional capacity and cardiorespiratory fitness, so that participant can resume previous activities that she was able to participate in prior to surgery.   Expected Outcomes Paticipant will be able resume previous activities of daily living that she was able to engage in prior to surgery.      Core Components/Risk Factors/Patient Goals Review:    Core Components/Risk Factors/Patient Goals at Discharge (Final Review):    ITP Comments:     ITP Comments    Row Name 04/17/16 1330           ITP Comments Medical Director- Dr. Fransico Him, MD          Comments: Patient attended orientation from 0800 to 1530 to review rules and guidelines for program. Completed 6 minute walk test, Intitial ITP, and exercise prescription.  VSS. Telemetry-Sinus Rhythm.  Asymptomatic.Barnet Pall, RN,BSN 04/19/2016 3:22 PM

## 2016-04-23 ENCOUNTER — Ambulatory Visit (INDEPENDENT_AMBULATORY_CARE_PROVIDER_SITE_OTHER): Payer: Medicare Other | Admitting: *Deleted

## 2016-04-23 ENCOUNTER — Encounter (HOSPITAL_COMMUNITY): Payer: Medicare Other

## 2016-04-23 ENCOUNTER — Encounter (HOSPITAL_COMMUNITY)
Admission: RE | Admit: 2016-04-23 | Discharge: 2016-04-23 | Disposition: A | Discharge status: Home / Self Care | Payer: Medicare Other | Source: Ambulatory Visit | Attending: Cardiovascular Disease | Admitting: Cardiovascular Disease

## 2016-04-23 DIAGNOSIS — I340 Nonrheumatic mitral (valve) insufficiency: Secondary | ICD-10-CM | POA: Insufficient documentation

## 2016-04-23 DIAGNOSIS — Z7901 Long term (current) use of anticoagulants: Secondary | ICD-10-CM | POA: Diagnosis not present

## 2016-04-23 DIAGNOSIS — Z9889 Other specified postprocedural states: Secondary | ICD-10-CM

## 2016-04-23 LAB — POCT INR: INR: 1.8

## 2016-04-23 NOTE — Progress Notes (Signed)
Daily Session Note  Patient Details  Name: Destiny Andersen MRN: [id number] Date of Birth: 05-09-1946 Referring Provider:   Flowsheet Row CARDIAC REHAB PHASE II ORIENTATION from 04/17/2016 in [place]  Referring Provider  Mertie Moores, MD      Encounter Date: 04/23/2016  Check In:     Session Check In - 04/23/16 1043      Check-In   Location [facility]   Staff Present Seward Carol, MS, ACSM CEP, Exercise Physiologist;Maria Whitaker, RN, Lise Auer, MS, Exercise Physiologist;Carlette Wilber Oliphant, RN, Luisa Hart, RN, BSN   Supervising physician immediately available to respond to emergencies [facility] immediately available   Physician(s) Dr Lonny Prude   Medication changes reported     No   Fall or balance concerns reported    No   Warm-up and Cool-down Performed as group-led instruction   Resistance Training Performed Yes   VAD Patient? No     Pain Assessment   Currently in Pain? No/denies      Capillary Blood Glucose: Results for orders placed or performed in visit on 04/23/16 (from the past 24 hour(s))  POCT INR     Status: None   Collection Time: 04/23/16  9:03 AM  Result Value Ref Range   INR 1.8      Goals Met:  No report of cardiac concerns or symptoms  Goals Unmet:  Not Applicable  Comments: Pt started cardiac rehab today.  Pt tolerated light exercise without difficulty. VSS, telemetry-Sinus Rhythm, asymptomatic.  Medication list reconciled. Pt denies barriers to medicaiton compliance.  PSYCHOSOCIAL ASSESSMENT:  PHQ-0. Pt exhibits positive coping skills, hopeful outlook with supportive family. No psychosocial needs identified at this time, no psychosocial interventions necessary.    Pt enjoys knitting, reading .   Pt oriented to exercise equipment and routine.    Understanding verbalized. [name] did report experiencing bilateral chronic knee  On the airdyne today. Will adjust equipment to meet  [name]'s exercise needs. [name] RN BSN   Dr. Fransico Him is Medical Director for Cardiac Rehab at [facility].

## 2016-04-25 ENCOUNTER — Encounter (HOSPITAL_COMMUNITY)
Admission: RE | Admit: 2016-04-25 | Discharge: 2016-04-25 | Disposition: A | Discharge status: Home / Self Care | Payer: Medicare Other | Source: Ambulatory Visit | Attending: Cardiovascular Disease | Admitting: Cardiovascular Disease

## 2016-04-25 ENCOUNTER — Encounter (HOSPITAL_COMMUNITY): Payer: Medicare Other

## 2016-04-25 DIAGNOSIS — Z9889 Other specified postprocedural states: Secondary | ICD-10-CM | POA: Diagnosis not present

## 2016-04-25 NOTE — Progress Notes (Signed)
Cardiac Individual Treatment Plan  Patient Details  Name: Destiny Andersen MRN: [id number] Date of Birth: 1946/01/06 Referring Provider:   Flowsheet Row CARDIAC REHAB PHASE II ORIENTATION from 04/17/2016 in [place]  Referring Provider  [name], Arnette Norris, MD      Initial Encounter Date:  Flowsheet Row CARDIAC REHAB PHASE II ORIENTATION from 04/17/2016 in [place]  Date  04/17/16  Referring Provider  [name], Arnette Norris, MD      Visit Diagnosis: S/P mitral valve repair  Patient's Home Medications on Admission:  Current Outpatient Prescriptions:  .  aspirin 81 MG tablet, Take 81 mg by mouth daily., Disp: , Rfl:  .  calcium citrate-vitamin D (CITRACAL+D) 315-200 MG-UNIT tablet, Take 1 tablet by mouth 3 (three) times daily. VITAMIN D DOSAGE IS 250MG  INSTEAD OF 200, Disp: , Rfl:  .  metoprolol tartrate (LOPRESSOR) 25 MG tablet, Take 1/2 tab (12.5 mg) in the morning and 1 tab (25 mg) in the evening, Disp: 135 tablet, Rfl: 3 .  warfarin (COUMADIN) 2.5 MG tablet, Take 1 to 1.5 tablets by mouth daily as directed by coumadin clinic, Disp: 45 tablet, Rfl: 3  Past Medical History: Past Medical History:  Diagnosis Date  . Cancer [facility]) 1983   Cervical cancer.  Skin Cancer- leg  . Chronic headaches    "? Migraines"- does not have then any more  . Constipation   . Elevated C-reactive protein (CRP)   . Family history of breast cancer    Mother  . Fatigue   . Heart murmur    ECHO 02/10/13 EF = 60-65%, moderate posterior mitral valve prolapse w/possible flail segment, severe MR, mitral regurgitant jet is anteriorly directed.  Mar[place] Kitchen History of blood transfusion    pre-eclampsia   . Hyperpotassemia    Migrated  . Medicare annual wellness visit, subsequent   . Mitral valve prolapse   . MR (mitral regurgitation)    ECHO 02/10/13 EF = 60-65%, moderate posterior mitral valve prolapse w/possible flail segment, severe MR, mitral regurgitant  jet is anteriorly directed.  . Risk for falls   . Routine general medical examination at a health care facility   . S/P minimally invasive mitral valve repair 02/01/2016   Complex valvuloplasty including triangular resection of flail posterior leaflet, artificial Gore-tex neochord placement x10 and 28 mm Sorin Memo 3D ring annuloplasty via right mini thoracotomy approach  . Seizures ([place])    with pre- echa  . Shortness of breath dyspnea    with exertion    Tobacco Use: History  Smoking Status  . Former Smoker  . Years: 25.00  . Types: Cigarettes  . Quit date: 06/18/1985  Smokeless Tobacco  . Never Used    Labs: Recent Review Flowsheet Data    Labs for ITP Cardiac and Pulmonary Rehab Latest Ref Rng & Units 02/01/2016 02/02/2016 02/02/2016 02/02/2016 02/02/2016   Hemoglobin A1c 4.8 - 5.6 % - - - - -   PHART 7.350 - 7.450 - 7.388 7.428 7.350 -   PCO2ART 35.0 - 45.0 mmHg - 40.9 40.5 40.7 -   HCO3 20.0 - 24.0 mEq/L - 24.8(H) 26.8(H) 22.5 -   TCO2 0 - 100 mmol/L 25 26 28 24 24    ACIDBASEDEF 0.0 - 2.0 mmol/L - - - 3.0(H) -   O2SAT % - 98.0 98.0 97.0 -      Capillary Blood Glucose: Lab Results  Component Value Date   GLUCAP 96 02/03/2016   GLUCAP 85 02/02/2016  GLUCAP 121 (H) 02/02/2016   GLUCAP 136 (H) 02/02/2016   GLUCAP 155 (H) 02/02/2016     Exercise Target Goals:    Exercise Program Goal: Individual exercise prescription set with THRR, safety & activity barriers. Participant demonstrates ability to understand and report RPE using BORG scale, to self-measure pulse accurately, and to acknowledge the importance of the exercise prescription.  Exercise Prescription Goal: Starting with aerobic activity 30 plus minutes a day, 3 days per week for initial exercise prescription. Provide home exercise prescription and guidelines that participant acknowledges understanding prior to discharge.  Activity Barriers & Risk Stratification:     Activity Barriers & Cardiac Risk  Stratification - 04/17/16 1441      Activity Barriers & Cardiac Risk Stratification   Activity Barriers None   Cardiac Risk Stratification Moderate      6 Minute Walk:     6 Minute Walk    Row Name 04/18/16 1636         6 Minute Walk   Phase Initial     Distance 1756 feet     Walk Time 6 minutes     # of Rest Breaks 0     MPH 3.33     METS 3.98     RPE 9     VO2 Peak 13.93     Symptoms No     Resting HR 97 bpm     Resting BP 109/73     Max Ex. HR 118 bpm     Max Ex. BP 112/70     2 Minute Post BP 102/70        Initial Exercise Prescription:     Initial Exercise Prescription - 04/18/16 1600      Date of Initial Exercise RX and Referring Provider   Date 04/17/16   Referring Provider [name], Arnette Norris, MD     Treadmill   MPH 2.8   Grade 1   Minutes 10   METs 3.53     Bike   Level 0.9   Minutes 10   METs 4     NuStep   Level 2   Minutes 10   METs 3     Prescription Details   Frequency (times per week) 3   Duration Progress to 30 minutes of continuous aerobic without signs/symptoms of physical distress     Intensity   THRR 40-80% of Max Heartrate 60-119   Ratings of Perceived Exertion 11-13   Perceived Dyspnea 0-4     Progression   Progression Continue to progress workloads to maintain intensity without signs/symptoms of physical distress.     Resistance Training   Training Prescription Yes   Weight 2lbs.   Reps 10-12      Perform Capillary Blood Glucose checks as needed.  Exercise Prescription Changes:      Exercise Prescription Changes    Row Name 04/23/16 1200             Response to Exercise   Blood Pressure (Admit) 96/72       Blood Pressure (Exercise) 108/70       Blood Pressure (Exit) 104/60       Heart Rate (Admit) 102 bpm       Heart Rate (Exercise) 128 bpm       Heart Rate (Exit) 100 bpm       Intensity THRR unchanged         Progression   Progression Continue to progress workloads to maintain intensity without  signs/symptoms of  physical distress.       Average METs 3.31         Resistance Training   Training Prescription Yes       Weight 2lbs.       Reps 10-12         Treadmill   MPH 2.8       Grade 1       Minutes 10       METs 3.53         Bike   Level 0.9       Minutes 10       METs 4         NuStep   Level 2       Minutes 10       METs 2.4          Exercise Comments:      Exercise Comments    Row Name 04/23/16 1211           Exercise Comments Pt is off to a great start with exercise           Discharge Exercise Prescription (Final Exercise Prescription Changes):     Exercise Prescription Changes - 04/23/16 1200      Response to Exercise   Blood Pressure (Admit) 96/72   Blood Pressure (Exercise) 108/70   Blood Pressure (Exit) 104/60   Heart Rate (Admit) 102 bpm   Heart Rate (Exercise) 128 bpm   Heart Rate (Exit) 100 bpm   Intensity THRR unchanged     Progression   Progression Continue to progress workloads to maintain intensity without signs/symptoms of physical distress.   Average METs 3.31     Resistance Training   Training Prescription Yes   Weight 2lbs.   Reps 10-12     Treadmill   MPH 2.8   Grade 1   Minutes 10   METs 3.53     Bike   Level 0.9   Minutes 10   METs 4     NuStep   Level 2   Minutes 10   METs 2.4      Nutrition:  Target Goals: Understanding of nutrition guidelines, daily intake of sodium 1500mg , cholesterol 200mg , calories 30% from fat and 7% or less from saturated fats, daily to have 5 or more servings of fruits and vegetables.  Biometrics:     Pre Biometrics - 04/18/16 1645      Pre Biometrics   Height 5\' 5"  (1.651 m)   Weight 124 lb 9 oz (56.5 kg)   Waist Circumference 27 inches   Hip Circumference 36.25 inches   Waist to Hip Ratio 0.74 %   BMI (Calculated) 20.8   Triceps Skinfold 24 mm   % Body Fat 21.2 %   Grip Strength 27.5 kg   Flexibility 17.5 in   Single Leg Stand 30 seconds        Nutrition Therapy Plan and Nutrition Goals:     Nutrition Therapy & Goals - 04/18/16 1000      Nutrition Therapy   Diet Therapeutic Lifestyle Changes     Personal Nutrition Goals   Personal Goal #1 Pt to maintain her wt while in [place], educate and counsel regarding individualized specific dietary modifications aiming towards targeted core components such as weight, hypertension, lipid management, diabetes, heart failure and other comorbidities.   Expected Outcomes Short Term Goal: Understand basic principles of dietary content, such  as calories, fat, sodium, cholesterol and nutrients.;Long Term Goal: Adherence to prescribed nutrition plan.      Nutrition Discharge: Nutrition Scores:     Nutrition Assessments - 04/18/16 1000      MEDFICTS Scores   Pre Score 13      Nutrition Goals Re-Evaluation:   Psychosocial: Target Goals: Acknowledge presence or absence of depression, maximize coping skills, provide positive support system. Participant is able to verbalize types and ability to use techniques and skills needed for reducing stress and depression.  Initial Review & Psychosocial Screening:     Initial Psych Review & Screening - 04/19/16 Violet? Yes     Barriers   Psychosocial barriers to participate in program There are no identifiable barriers or psychosocial needs.     Screening Interventions   Interventions Encouraged to exercise      Quality of Life Scores:     Quality of Life - 04/17/16 1627      Quality of Life Scores   Health/Function Pre 25.38 %   Socioeconomic Pre 27.5 %   Psych/Spiritual Pre 20.57 %   Family Pre 23.63 %   GLOBAL Pre 24.45 %      PHQ-9: Recent Review Flowsheet Data    Depression screen PHQ 2/9 04/23/2016   Decreased Interest 0   Down, Depressed, Hopeless 0   PHQ - 2 Score 0      Psychosocial Evaluation and  Intervention:   Psychosocial Re-Evaluation:     Psychosocial Re-Evaluation    Row Name 04/25/16 1805             Psychosocial Re-Evaluation   Interventions Encouraged to attend Cardiac Rehabilitation for the exercise       Continued Psychosocial Services Needed No          Vocational Rehabilitation: Provide vocational rehab assistance to qualifying candidates.   Vocational Rehab Evaluation & Intervention:     Vocational Rehab - 04/19/16 1514      Initial Vocational Rehab Evaluation & Intervention   Assessment shows need for Vocational Rehabilitation No  Leilanii is retired      Education: Education Goals: Education classes will be provided on a weekly basis, covering required topics. Participant will state understanding/return demonstration of topics presented.  Learning Barriers/Preferences:     Learning Barriers/Preferences - 04/18/16 1643      Learning Barriers/Preferences   Learning Barriers Sight   Learning Preferences Audio;Skilled Demonstration      Education Topics: Count Your Pulse:  -Group instruction provided by verbal instruction, demonstration, patient participation and written materials to support subject.  Instructors address importance of being able to find your pulse and how to count your pulse when at home without a heart monitor.  Patients get hands on experience counting their pulse with staff help and individually.   Heart Attack, Angina, and Risk Factor Modification:  -Group instruction provided by verbal instruction, video, and written materials to support subject.  Instructors address signs and symptoms of angina and heart attacks.    Also discuss risk factors for heart disease and how to make changes to improve heart health risk factors.   Functional Fitness:  -Group instruction provided by verbal instruction, demonstration, patient participation, and written materials to support subject.  Instructors address safety measures for doing  things around the house.  Discuss how to get up and down off the floor, how to pick things up properly, how to safely get out of  a chair without assistance, and balance training.   Meditation and Mindfulness:  -Group instruction provided by verbal instruction, patient participation, and written materials to support subject.  Instructor addresses importance of mindfulness and meditation practice to help reduce stress and improve awareness.  Instructor also leads participants through a meditation exercise.    Stretching for Flexibility and Mobility:  -Group instruction provided by verbal instruction, patient participation, and written materials to support subject.  Instructors lead participants through series of stretches that are designed to increase flexibility thus improving mobility.  These stretches are additional exercise for major muscle groups that are typically performed during regular warm up and cool down.   Hands Only CPR Anytime:  -Group instruction provided by verbal instruction, video, patient participation and written materials to support subject.  Instructors co-teach with AHA video for hands only CPR.  Participants get hands on experience with mannequins.   Nutrition I class: Heart Healthy Eating:  -Group instruction provided by PowerPoint slides, verbal discussion, and written materials to support subject matter. The instructor gives an explanation and review of the Therapeutic Lifestyle Changes diet recommendations, which includes a discussion on lipid goals, dietary fat, sodium, fiber, plant stanol/sterol esters, sugar, and the components of a well-balanced, healthy diet. Flowsheet Row CARDIAC REHAB PHASE II EXERCISE from 04/25/2016 in [place]  Date  04/24/16  Educator  RD  Instruction Review Code  2- meets goals/outcomes      Nutrition II class: Lifestyle Skills:  -Group instruction provided by PowerPoint slides, verbal discussion, and  written materials to support subject matter. The instructor gives an explanation and review of label reading, grocery shopping for heart health, heart healthy recipe modifications, and ways to make healthier choices when eating out.   Diabetes Question & Answer:  -Group instruction provided by PowerPoint slides, verbal discussion, and written materials to support subject matter. The instructor gives an explanation and review of diabetes co-morbidities, pre- and post-prandial blood glucose goals, pre-exercise blood glucose goals, signs, symptoms, and treatment of hypoglycemia and hyperglycemia, and foot care basics.   Diabetes Blitz:  -Group instruction provided by PowerPoint slides, verbal discussion, and written materials to support subject matter. The instructor gives an explanation and review of the physiology behind type 1 and type 2 diabetes, diabetes medications and rational behind using different medications, pre- and post-prandial blood glucose recommendations and Hemoglobin A1c goals, diabetes diet, and exercise including blood glucose guidelines for exercising safely.    Portion Distortion:  -Group instruction provided by PowerPoint slides, verbal discussion, written materials, and food models to support subject matter. The instructor gives an explanation of serving size versus portion size, changes in portions sizes over the last 20 years, and what consists of a serving from each food group.   Stress Management:  -Group instruction provided by verbal instruction, video, and written materials to support subject matter.  Instructors review role of stress in heart disease and how to cope with stress positively.     Exercising on Your Own:  -Group instruction provided by verbal instruction, power point, and written materials to support subject.  Instructors discuss benefits of exercise, components of exercise, frequency and intensity of exercise, and end points for exercise.  Also discuss  use of nitroglycerin and activating EMS.  Review options of places to exercise outside of rehab.  Review guidelines for sex with heart disease.   Cardiac Drugs I:  -Group instruction provided by verbal instruction and written materials to support subject.  Instructor reviews  cardiac drug classes: antiplatelets, anticoagulants, beta blockers, and statins.  Instructor discusses reasons, side effects, and lifestyle considerations for each drug class.   Cardiac Drugs II:  -Group instruction provided by verbal instruction and written materials to support subject.  Instructor reviews cardiac drug classes: angiotensin converting enzyme inhibitors (ACE-I), angiotensin II receptor blockers (ARBs), nitrates, and calcium channel blockers.  Instructor discusses reasons, side effects, and lifestyle considerations for each drug class. Flowsheet Row CARDIAC REHAB PHASE II EXERCISE from 04/25/2016 in [place]  Date  04/25/16  Educator  [facility]  Instruction Review Code  2- meets goals/outcomes      Anatomy and Physiology of the Circulatory System:  -Group instruction provided by verbal instruction, video, and written materials to support subject.  Reviews functional anatomy of heart, how it relates to various diagnoses, and what role the heart plays in the overall system.   Knowledge Questionnaire Score:     Knowledge Questionnaire Score - 04/17/16 1626      Knowledge Questionnaire Score   Pre Score 22/24      Core Components/Risk Factors/Patient Goals at Admission:     Personal Goals and Risk Factors at Admission - 04/18/16 1632      Core Components/Risk Factors/Patient Goals on Admission   Hypertension Yes   Intervention Provide education on lifestyle modifcations including regular physical activity/exercise, weight management, moderate sodium restriction and increased consumption of fresh fruit, vegetables, and low fat dairy, alcohol moderation, and  smoking cessation.;Monitor prescription use compliance.   Expected Outcomes Short Term: Continued assessment and intervention until BP is < 140/56mm HG in hypertensive participants. < 130/64mm HG in hypertensive participants with diabetes, heart failure or chronic kidney disease.;Long Term: Maintenance of blood pressure at goal levels.   Personal Goal Other Yes   Personal Goal Return to previous state of health prior to surgery.   Intervention Develop an individualized exercise program including stretching, aerobic and resistance training program to help improve functional capacity and cardiorespiratory fitness, so that participant can resume previous activities that she was able to participate in prior to surgery.   Expected Outcomes Paticipant will be able resume previous activities of daily living that she was able to engage in prior to surgery.      Core Components/Risk Factors/Patient Goals Review:      Goals and Risk Factor Review    Row Name 04/23/16 1212             Core Components/Risk Factors/Patient Goals Review   Personal Goals Review Increase Strength and Stamina;Other       Review Continue with individualized exercise program to help improve functional capacity and cardiorespiratory fitness       Expected Outcomes Be able to resume to previous ADLs          Core Components/Risk Factors/Patient Goals at Discharge (Final Review):      Goals and Risk Factor Review - 04/23/16 1212      Core Components/Risk Factors/Patient Goals Review   Personal Goals Review Increase Strength and Stamina;Other   Review Continue with individualized exercise program to help improve functional capacity and cardiorespiratory fitness   Expected Outcomes Be able to resume to previous ADLs      ITP Comments:     ITP Comments    Row Name 04/17/16 1330           ITP Comments Medical Director- Dr. Fransico Him, MD          Comments: Anaka is making  expected progress toward personal  goals after completing 3 sessions. Recommend continued exercise and life style modification education including  stress management and relaxation techniques to decrease cardiac risk profile. Albertha is off to a good start.[name] RN BSN

## 2016-04-27 ENCOUNTER — Encounter (HOSPITAL_COMMUNITY)
Admission: RE | Admit: 2016-04-27 | Discharge: 2016-04-27 | Disposition: A | Discharge status: Home / Self Care | Payer: Medicare Other | Source: Ambulatory Visit | Attending: Cardiovascular Disease | Admitting: Cardiovascular Disease

## 2016-04-27 ENCOUNTER — Encounter (HOSPITAL_COMMUNITY): Payer: Medicare Other

## 2016-04-27 DIAGNOSIS — Z9889 Other specified postprocedural states: Secondary | ICD-10-CM | POA: Diagnosis not present

## 2016-04-30 ENCOUNTER — Encounter (HOSPITAL_COMMUNITY)
Admission: RE | Admit: 2016-04-30 | Discharge: 2016-04-30 | Disposition: A | Discharge status: Home / Self Care | Payer: Medicare Other | Source: Ambulatory Visit | Attending: Cardiovascular Disease | Admitting: Cardiovascular Disease

## 2016-04-30 ENCOUNTER — Encounter (HOSPITAL_COMMUNITY): Payer: Medicare Other

## 2016-04-30 DIAGNOSIS — Z9889 Other specified postprocedural states: Secondary | ICD-10-CM | POA: Diagnosis not present

## 2016-05-02 ENCOUNTER — Encounter (HOSPITAL_COMMUNITY): Payer: Medicare Other

## 2016-05-02 ENCOUNTER — Encounter (HOSPITAL_COMMUNITY)
Admission: RE | Admit: 2016-05-02 | Discharge: 2016-05-02 | Disposition: A | Discharge status: Home / Self Care | Payer: Medicare Other | Source: Ambulatory Visit | Attending: Cardiovascular Disease | Admitting: Cardiovascular Disease

## 2016-05-02 DIAGNOSIS — Z9889 Other specified postprocedural states: Secondary | ICD-10-CM | POA: Diagnosis not present

## 2016-05-04 ENCOUNTER — Encounter (HOSPITAL_COMMUNITY)
Admission: RE | Admit: 2016-05-04 | Discharge: 2016-05-04 | Disposition: A | Discharge status: Home / Self Care | Payer: Medicare Other | Source: Ambulatory Visit | Attending: Cardiovascular Disease | Admitting: Cardiovascular Disease

## 2016-05-04 ENCOUNTER — Encounter (HOSPITAL_COMMUNITY): Payer: Medicare Other

## 2016-05-04 DIAGNOSIS — Z9889 Other specified postprocedural states: Secondary | ICD-10-CM

## 2016-05-07 ENCOUNTER — Ambulatory Visit (INDEPENDENT_AMBULATORY_CARE_PROVIDER_SITE_OTHER): Payer: Medicare Other | Admitting: *Deleted

## 2016-05-07 ENCOUNTER — Encounter (HOSPITAL_COMMUNITY)
Admission: RE | Admit: 2016-05-07 | Discharge: 2016-05-07 | Disposition: A | Discharge status: Home / Self Care | Payer: Medicare Other | Source: Ambulatory Visit | Attending: Cardiovascular Disease | Admitting: Cardiovascular Disease

## 2016-05-07 ENCOUNTER — Encounter (HOSPITAL_COMMUNITY): Payer: Medicare Other

## 2016-05-07 DIAGNOSIS — Z9889 Other specified postprocedural states: Secondary | ICD-10-CM

## 2016-05-07 DIAGNOSIS — Z7901 Long term (current) use of anticoagulants: Secondary | ICD-10-CM

## 2016-05-07 LAB — POCT INR: INR: 1.9

## 2016-05-08 ENCOUNTER — Telehealth: Payer: Self-pay | Admitting: Cardiovascular Disease

## 2016-05-08 NOTE — Telephone Encounter (Signed)
Walk in Pt Form-[employer] Release For Physical Activity-Form Dropped off gave to Michelle/Dr.[name]

## 2016-05-09 ENCOUNTER — Encounter (HOSPITAL_COMMUNITY): Payer: Medicare Other

## 2016-05-09 ENCOUNTER — Telehealth: Payer: Self-pay

## 2016-05-09 ENCOUNTER — Encounter (HOSPITAL_COMMUNITY)
Admission: RE | Admit: 2016-05-09 | Discharge: 2016-05-09 | Disposition: A | Discharge status: Home / Self Care | Payer: Medicare Other | Source: Ambulatory Visit | Attending: Cardiovascular Disease | Admitting: Cardiovascular Disease

## 2016-05-09 DIAGNOSIS — Z9889 Other specified postprocedural states: Secondary | ICD-10-CM | POA: Diagnosis not present

## 2016-05-09 NOTE — Telephone Encounter (Signed)
Pt came in today to get a release form from [facility] for physical activity. Dr. Acie Fredrickson stated  - progressive physical activity with avoidance of lifting > 30 lbs for 3 months - then no restrictions. No push ups yet.   Gave completed form to pt. Reviewed recommendations with pt. Pt verbalized understanding and thanked me.

## 2016-05-14 ENCOUNTER — Encounter (HOSPITAL_COMMUNITY): Payer: Medicare Other

## 2016-05-14 ENCOUNTER — Encounter (HOSPITAL_COMMUNITY)
Admission: RE | Admit: 2016-05-14 | Discharge: 2016-05-14 | Disposition: A | Discharge status: Home / Self Care | Payer: Medicare Other | Source: Ambulatory Visit | Attending: Cardiovascular Disease | Admitting: Cardiovascular Disease

## 2016-05-14 DIAGNOSIS — Z9889 Other specified postprocedural states: Secondary | ICD-10-CM | POA: Diagnosis not present

## 2016-05-16 ENCOUNTER — Encounter (HOSPITAL_COMMUNITY): Payer: Medicare Other

## 2016-05-16 ENCOUNTER — Encounter (HOSPITAL_COMMUNITY)
Admission: RE | Admit: 2016-05-16 | Discharge: 2016-05-16 | Disposition: A | Discharge status: Home / Self Care | Payer: Medicare Other | Source: Ambulatory Visit | Attending: Cardiovascular Disease | Admitting: Cardiovascular Disease

## 2016-05-16 DIAGNOSIS — Z9889 Other specified postprocedural states: Secondary | ICD-10-CM

## 2016-05-18 ENCOUNTER — Encounter (HOSPITAL_COMMUNITY)
Admission: RE | Admit: 2016-05-18 | Discharge: 2016-05-18 | Disposition: A | Discharge status: Home / Self Care | Payer: Medicare Other | Source: Ambulatory Visit | Attending: Cardiovascular Disease | Admitting: Cardiovascular Disease

## 2016-05-18 ENCOUNTER — Encounter (HOSPITAL_COMMUNITY): Payer: Medicare Other

## 2016-05-18 DIAGNOSIS — Z9889 Other specified postprocedural states: Secondary | ICD-10-CM | POA: Diagnosis not present

## 2016-05-18 DIAGNOSIS — I340 Nonrheumatic mitral (valve) insufficiency: Secondary | ICD-10-CM | POA: Diagnosis not present

## 2016-05-21 ENCOUNTER — Encounter (HOSPITAL_COMMUNITY): Payer: Medicare Other

## 2016-05-21 ENCOUNTER — Encounter (HOSPITAL_COMMUNITY)
Admission: RE | Admit: 2016-05-21 | Discharge: 2016-05-21 | Disposition: A | Discharge status: Home / Self Care | Payer: Medicare Other | Source: Ambulatory Visit | Attending: Cardiovascular Disease | Admitting: Cardiovascular Disease

## 2016-05-21 DIAGNOSIS — Z9889 Other specified postprocedural states: Secondary | ICD-10-CM | POA: Diagnosis not present

## 2016-05-23 ENCOUNTER — Encounter (HOSPITAL_COMMUNITY)
Admission: RE | Admit: 2016-05-23 | Discharge: 2016-05-23 | Disposition: A | Discharge status: Home / Self Care | Payer: Medicare Other | Source: Ambulatory Visit | Attending: Cardiovascular Disease | Admitting: Cardiovascular Disease

## 2016-05-23 ENCOUNTER — Encounter (HOSPITAL_COMMUNITY): Payer: Medicare Other

## 2016-05-23 DIAGNOSIS — Z9889 Other specified postprocedural states: Secondary | ICD-10-CM | POA: Diagnosis not present

## 2016-05-23 NOTE — Progress Notes (Signed)
Cardiac Individual Treatment Plan  Patient Details  Name: Destiny Andersen MRN: [id number] Date of Birth: 1946/06/05 Referring Provider:   Flowsheet Row CARDIAC REHAB PHASE II ORIENTATION from 04/17/2016 in [place]  Referring Provider  [name], Arnette Norris, MD      Initial Encounter Date:  Flowsheet Row CARDIAC REHAB PHASE II ORIENTATION from 04/17/2016 in [place]  Date  04/17/16  Referring Provider  [name], Arnette Norris, MD      Visit Diagnosis: S/P mitral valve repair  Patient's Home Medications on Admission:  Current Outpatient Prescriptions:  .  aspirin 81 MG tablet, Take 81 mg by mouth daily., Disp: , Rfl:  .  calcium citrate-vitamin D (CITRACAL+D) 315-200 MG-UNIT tablet, Take 1 tablet by mouth 3 (three) times daily. VITAMIN D DOSAGE IS 250MG  INSTEAD OF 200, Disp: , Rfl:  .  metoprolol tartrate (LOPRESSOR) 25 MG tablet, Take 1/2 tab (12.5 mg) in the morning and 1 tab (25 mg) in the evening, Disp: 135 tablet, Rfl: 3 .  warfarin (COUMADIN) 2.5 MG tablet, Take 1 to 1.5 tablets by mouth daily as directed by coumadin clinic, Disp: 45 tablet, Rfl: 3  Past Medical History: Past Medical History:  Diagnosis Date  . Cancer [facility]) 1983   Cervical cancer.  Skin Cancer- leg  . Chronic headaches    "? Migraines"- does not have then any more  . Constipation   . Elevated C-reactive protein (CRP)   . Family history of breast cancer    Mother  . Fatigue   . Heart murmur    ECHO 02/10/13 EF = 60-65%, moderate posterior mitral valve prolapse w/possible flail segment, severe MR, mitral regurgitant jet is anteriorly directed.  Marland Kitchen History of blood transfusion    pre-eclampsia   . Hyperpotassemia    Migrated  . Medicare annual wellness visit, subsequent   . Mitral valve prolapse   . MR (mitral regurgitation)    ECHO 02/10/13 EF = 60-65%, moderate posterior mitral valve prolapse w/possible flail segment, severe MR, mitral regurgitant  jet is anteriorly directed.  . Risk for falls   . Routine general medical examination at a health care facility   . S/P minimally invasive mitral valve repair 02/01/2016   Complex valvuloplasty including triangular resection of flail posterior leaflet, artificial Gore-tex neochord placement x10 and 28 mm Sorin Memo 3D ring annuloplasty via right mini thoracotomy approach  . Seizures ([place])    with pre- echa  . Shortness of breath dyspnea    with exertion    Tobacco Use: History  Smoking Status  . Former Smoker  . Years: 25.00  . Types: Cigarettes  . Quit date: 06/18/1985  Smokeless Tobacco  . Never Used    Labs: Recent Review Flowsheet Data    Labs for ITP Cardiac and Pulmonary Rehab Latest Ref Rng & Units 02/01/2016 02/02/2016 02/02/2016 02/02/2016 02/02/2016   Hemoglobin A1c 4.8 - 5.6 % - - - - -   PHART 7.350 - 7.450 - 7.388 7.428 7.350 -   PCO2ART 35.0 - 45.0 mmHg - 40.9 40.5 40.7 -   HCO3 20.0 - 24.0 mEq/L - 24.8(H) 26.8(H) 22.5 -   TCO2 0 - 100 mmol/L 25 26 28 24 24    ACIDBASEDEF 0.0 - 2.0 mmol/L - - - 3.0(H) -   O2SAT % - 98.0 98.0 97.0 -      Capillary Blood Glucose: Lab Results  Component Value Date   GLUCAP 96 02/03/2016   GLUCAP 85 02/02/2016  GLUCAP 121 (H) 02/02/2016   GLUCAP 136 (H) 02/02/2016   GLUCAP 155 (H) 02/02/2016     Exercise Target Goals:    Exercise Program Goal: Individual exercise prescription set with THRR, safety & activity barriers. Participant demonstrates ability to understand and report RPE using BORG scale, to self-measure pulse accurately, and to acknowledge the importance of the exercise prescription.  Exercise Prescription Goal: Starting with aerobic activity 30 plus minutes a day, 3 days per week for initial exercise prescription. Provide home exercise prescription and guidelines that participant acknowledges understanding prior to discharge.  Activity Barriers & Risk Stratification:     Activity Barriers & Cardiac Risk  Stratification - 04/17/16 1441      Activity Barriers & Cardiac Risk Stratification   Activity Barriers None   Cardiac Risk Stratification Moderate      6 Minute Walk:     6 Minute Walk    Row Name 04/18/16 1636         6 Minute Walk   Phase Initial     Distance 1756 feet     Walk Time 6 minutes     # of Rest Breaks 0     MPH 3.33     METS 3.98     RPE 9     VO2 Peak 13.93     Symptoms No     Resting HR 97 bpm     Resting BP 109/73     Max Ex. HR 118 bpm     Max Ex. BP 112/70     2 Minute Post BP 102/70        Initial Exercise Prescription:     Initial Exercise Prescription - 04/18/16 1600      Date of Initial Exercise RX and Referring Provider   Date 04/17/16   Referring Provider [name], Arnette Norris, MD     Treadmill   MPH 2.8   Grade 1   Minutes 10   METs 3.53     Bike   Level 0.9   Minutes 10   METs 4     NuStep   Level 2   Minutes 10   METs 3     Prescription Details   Frequency (times per week) 3   Duration Progress to 30 minutes of continuous aerobic without signs/symptoms of physical distress     Intensity   THRR 40-80% of Max Heartrate 60-119   Ratings of Perceived Exertion 11-13   Perceived Dyspnea 0-4     Progression   Progression Continue to progress workloads to maintain intensity without signs/symptoms of physical distress.     Resistance Training   Training Prescription Yes   Weight 2lbs.   Reps 10-12      Perform Capillary Blood Glucose checks as needed.  Exercise Prescription Changes:      Exercise Prescription Changes    Row Name 04/23/16 1200 05/23/16 1100           Response to Exercise   Blood Pressure (Admit) 96/72 102/58      Blood Pressure (Exercise) 108/70 120/70      Blood Pressure (Exit) 104/60 92/64      Heart Rate (Admit) 102 bpm 93 bpm      Heart Rate (Exercise) 128 bpm 113 bpm      Heart Rate (Exit) 100 bpm 92 bpm      Rating of Perceived Exertion (Exercise)  - 11      Duration  - Progress to 45  minutes of aerobic  exercise without signs/symptoms of physical distress      Intensity THRR unchanged  -        Progression   Progression Continue to progress workloads to maintain intensity without signs/symptoms of physical distress.  -      Average METs 3.31 3.5        Resistance Training   Training Prescription Yes Yes      Weight 2lbs. 2lbs.      Reps 10-12 10-12        Treadmill   MPH 2.8 3.4      Grade 1 2      Minutes 10 10      METs 3.53 4.54        Bike   Level 0.9 1      Minutes 10 10      METs 4 2.6        NuStep   Level 2 5      Minutes 10 10      METs 2.4 3.3         Exercise Comments:      Exercise Comments    Row Name 04/23/16 1211 05/23/16 1114         Exercise Comments Pt is off to a great start with exercise  Reviewed METs and goals with pt.  Continues to do well with exercise         Discharge Exercise Prescription (Final Exercise Prescription Changes):     Exercise Prescription Changes - 05/23/16 1100      Response to Exercise   Blood Pressure (Admit) 102/58   Blood Pressure (Exercise) 120/70   Blood Pressure (Exit) 92/64   Heart Rate (Admit) 93 bpm   Heart Rate (Exercise) 113 bpm   Heart Rate (Exit) 92 bpm   Rating of Perceived Exertion (Exercise) 11   Duration Progress to 45 minutes of aerobic exercise without signs/symptoms of physical distress     Progression   Average METs 3.5     Resistance Training   Training Prescription Yes   Weight 2lbs.   Reps 10-12     Treadmill   MPH 3.4   Grade 2   Minutes 10   METs 4.54     Bike   Level 1   Minutes 10   METs 2.6     NuStep   Level 5   Minutes 10   METs 3.3      Nutrition:  Target Goals: Understanding of nutrition guidelines, daily intake of sodium 1500mg , cholesterol 200mg , calories 30% from fat and 7% or less from saturated fats, daily to have 5 or more servings of fruits and vegetables.  Biometrics:     Pre Biometrics - 04/18/16 1645      Pre Biometrics    Height 5\' 5"  (1.651 m)   Weight 124 lb 9 oz (56.5 kg)   Waist Circumference 27 inches   Hip Circumference 36.25 inches   Waist to Hip Ratio 0.74 %   BMI (Calculated) 20.8   Triceps Skinfold 24 mm   % Body Fat 21.2 %   Grip Strength 27.5 kg   Flexibility 17.5 in   Single Leg Stand 30 seconds       Nutrition Therapy Plan and Nutrition Goals:     Nutrition Therapy & Goals - 04/18/16 1000      Nutrition Therapy   Diet Therapeutic Lifestyle Changes     Personal Nutrition Goals   Personal Goal #1 Pt to maintain her wt  while in [place], educate and counsel regarding individualized specific dietary modifications aiming towards targeted core components such as weight, hypertension, lipid management, diabetes, heart failure and other comorbidities.   Expected Outcomes Short Term Goal: Understand basic principles of dietary content, such as calories, fat, sodium, cholesterol and nutrients.;Long Term Goal: Adherence to prescribed nutrition plan.      Nutrition Discharge: Nutrition Scores:     Nutrition Assessments - 04/18/16 1000      MEDFICTS Scores   Pre Score 13      Nutrition Goals Re-Evaluation:   Psychosocial: Target Goals: Acknowledge presence or absence of depression, maximize coping skills, provide positive support system. Participant is able to verbalize types and ability to use techniques and skills needed for reducing stress and depression.  Initial Review & Psychosocial Screening:     Initial Psych Review & Screening - 04/19/16 [place]? Yes     Barriers   Psychosocial barriers to participate in program There are no identifiable barriers or psychosocial needs.     Screening Interventions   Interventions Encouraged to exercise      Quality of Life Scores:     Quality of Life - 04/17/16 1627      Quality of Life Scores   Health/Function Pre 25.38 %    Socioeconomic Pre 27.5 %   Psych/Spiritual Pre 20.57 %   Family Pre 23.63 %   GLOBAL Pre 24.45 %      PHQ-9: Recent Review Flowsheet Data    Depression screen [facility] 2/9 04/23/2016   Decreased Interest 0   Down, Depressed, Hopeless 0   PHQ - 2 Score 0      Psychosocial Evaluation and Intervention:   Psychosocial Re-Evaluation:     Psychosocial Re-Evaluation    Row Name 04/25/16 1805 05/23/16 1108           Psychosocial Re-Evaluation   Interventions Encouraged to attend Cardiac Rehabilitation for the exercise Encouraged to attend Cardiac Rehabilitation for the exercise      Continued Psychosocial Services Needed No No         Vocational Rehabilitation: Provide vocational rehab assistance to qualifying candidates.   Vocational Rehab Evaluation & Intervention:     Vocational Rehab - 04/19/16 1514      Initial Vocational Rehab Evaluation & Intervention   Assessment shows need for Vocational Rehabilitation No  Lechelle is retired      Education: Education Goals: Education classes will be provided on a weekly basis, covering required topics. Participant will state understanding/return demonstration of topics presented.  Learning Barriers/Preferences:     Learning Barriers/Preferences - 04/18/16 1643      Learning Barriers/Preferences   Learning Barriers Sight   Learning Preferences Audio;Skilled Demonstration      Education Topics: Count Your Pulse:  -Group instruction provided by verbal instruction, demonstration, patient participation and written materials to support subject.  Instructors address importance of being able to find your pulse and how to count your pulse when at home without a heart monitor.  Patients get hands on experience counting their pulse with staff help and individually.   Heart Attack, Angina, and Risk Factor Modification:  -Group instruction provided by verbal instruction, video, and written materials to support subject.  Instructors  address signs and symptoms of angina and heart attacks.    Also discuss risk factors for heart disease and how to make changes  to improve heart health risk factors.   Functional Fitness:  -Group instruction provided by verbal instruction, demonstration, patient participation, and written materials to support subject.  Instructors address safety measures for doing things around the house.  Discuss how to get up and down off the floor, how to pick things up properly, how to safely get out of a chair without assistance, and balance training.   Meditation and Mindfulness:  -Group instruction provided by verbal instruction, patient participation, and written materials to support subject.  Instructor addresses importance of mindfulness and meditation practice to help reduce stress and improve awareness.  Instructor also leads participants through a meditation exercise.    Stretching for Flexibility and Mobility:  -Group instruction provided by verbal instruction, patient participation, and written materials to support subject.  Instructors lead participants through series of stretches that are designed to increase flexibility thus improving mobility.  These stretches are additional exercise for major muscle groups that are typically performed during regular warm up and cool down. Flowsheet Row CARDIAC REHAB PHASE II EXERCISE from 05/23/2016 in [place]  Date  05/23/16  Educator  Seward Carol  Instruction Review Code  2- meets goals/outcomes      Hands Only CPR Anytime:  -Group instruction provided by verbal instruction, video, patient participation and written materials to support subject.  Instructors co-teach with AHA video for hands only CPR.  Participants get hands on experience with mannequins.   Nutrition I class: Heart Healthy Eating:  -Group instruction provided by PowerPoint slides, verbal discussion, and written materials to support subject matter. The  instructor gives an explanation and review of the Therapeutic Lifestyle Changes diet recommendations, which includes a discussion on lipid goals, dietary fat, sodium, fiber, plant stanol/sterol esters, sugar, and the components of a well-balanced, healthy diet. Flowsheet Row CARDIAC REHAB PHASE II EXERCISE from 05/23/2016 in [place]  Date  04/24/16  Educator  RD  Instruction Review Code  2- meets goals/outcomes      Nutrition II class: Lifestyle Skills:  -Group instruction provided by PowerPoint slides, verbal discussion, and written materials to support subject matter. The instructor gives an explanation and review of label reading, grocery shopping for heart health, heart healthy recipe modifications, and ways to make healthier choices when eating out. Flowsheet Row CARDIAC REHAB PHASE II EXERCISE from 05/23/2016 in [place]  Date  05/01/16  Educator  RD  Instruction Review Code  2- meets goals/outcomes      Diabetes Question & Answer:  -Group instruction provided by PowerPoint slides, verbal discussion, and written materials to support subject matter. The instructor gives an explanation and review of diabetes co-morbidities, pre- and post-prandial blood glucose goals, pre-exercise blood glucose goals, signs, symptoms, and treatment of hypoglycemia and hyperglycemia, and foot care basics.   Diabetes Blitz:  -Group instruction provided by PowerPoint slides, verbal discussion, and written materials to support subject matter. The instructor gives an explanation and review of the physiology behind type 1 and type 2 diabetes, diabetes medications and rational behind using different medications, pre- and post-prandial blood glucose recommendations and Hemoglobin A1c goals, diabetes diet, and exercise including blood glucose guidelines for exercising safely.    Portion Distortion:  -Group instruction provided by PowerPoint  slides, verbal discussion, written materials, and food models to support subject matter. The instructor gives an explanation of serving size versus portion size, changes in portions sizes over the last 20 years, and what consists of  a serving from each food group. Flowsheet Row CARDIAC REHAB PHASE II EXERCISE from 05/23/2016 in [place]  Date  05/16/16 [Holiday Eating Survival Tips]  Educator  RD  Instruction Review Code  2- meets goals/outcomes      Stress Management:  -Group instruction provided by verbal instruction, video, and written materials to support subject matter.  Instructors review role of stress in heart disease and how to cope with stress positively.   Flowsheet Row CARDIAC REHAB PHASE II EXERCISE from 05/23/2016 in [place]  Date  05/02/16  Instruction Review Code  2- meets goals/outcomes      Exercising on Your Own:  -Group instruction provided by verbal instruction, power point, and written materials to support subject.  Instructors discuss benefits of exercise, components of exercise, frequency and intensity of exercise, and end points for exercise.  Also discuss use of nitroglycerin and activating EMS.  Review options of places to exercise outside of rehab.  Review guidelines for sex with heart disease. Flowsheet Row CARDIAC REHAB PHASE II EXERCISE from 05/23/2016 in [place]  Date  05/04/16  Instruction Review Code  2- meets goals/outcomes      Cardiac Drugs I:  -Group instruction provided by verbal instruction and written materials to support subject.  Instructor reviews cardiac drug classes: antiplatelets, anticoagulants, beta blockers, and statins.  Instructor discusses reasons, side effects, and lifestyle considerations for each drug class.   Cardiac Drugs II:  -Group instruction provided by verbal instruction and written materials to support subject.  Instructor  reviews cardiac drug classes: angiotensin converting enzyme inhibitors (ACE-I), angiotensin II receptor blockers (ARBs), nitrates, and calcium channel blockers.  Instructor discusses reasons, side effects, and lifestyle considerations for each drug class. Flowsheet Row CARDIAC REHAB PHASE II EXERCISE from 05/23/2016 in [place]  Date  04/25/16  Educator  Kennyth Lose [facility]  Instruction Review Code  2- meets goals/outcomes      Anatomy and Physiology of the Circulatory System:  -Group instruction provided by verbal instruction, video, and written materials to support subject.  Reviews functional anatomy of heart, how it relates to various diagnoses, and what role the heart plays in the overall system.   Knowledge Questionnaire Score:     Knowledge Questionnaire Score - 04/17/16 1626      Knowledge Questionnaire Score   Pre Score 22/24      Core Components/Risk Factors/Patient Goals at Admission:     Personal Goals and Risk Factors at Admission - 04/18/16 1632      Core Components/Risk Factors/Patient Goals on Admission   Hypertension Yes   Intervention Provide education on lifestyle modifcations including regular physical activity/exercise, weight management, moderate sodium restriction and increased consumption of fresh fruit, vegetables, and low fat dairy, alcohol moderation, and smoking cessation.;Monitor prescription use compliance.   Expected Outcomes Short Term: Continued assessment and intervention until BP is < 140/87mm HG in hypertensive participants. < 130/97mm HG in hypertensive participants with diabetes, heart failure or chronic kidney disease.;Long Term: Maintenance of blood pressure at goal levels.   Personal Goal Other Yes   Personal Goal Return to previous state of health prior to surgery.   Intervention Develop an individualized exercise program including stretching, aerobic and resistance training program to help improve functional  capacity and cardiorespiratory fitness, so that participant can resume previous activities that she was able to participate in prior to surgery.   Expected Outcomes Paticipant will  be able resume previous activities of daily living that she was able to engage in prior to surgery.      Core Components/Risk Factors/Patient Goals Review:      Goals and Risk Factor Review    Row Name 04/23/16 1212 05/23/16 1114           Core Components/Risk Factors/Patient Goals Review   Personal Goals Review Increase Strength and Stamina;Other Increase Strength and Stamina;Other      Review Continue with individualized exercise program to help improve functional capacity and cardiorespiratory fitness Pt states she is feeling more independent since beginning an exercise program.  She is working with a trainer at the gym 2 days/week.      Expected Outcomes Be able to resume to previous ADLs Continue exercise program to improve with ADLs and cardiovascular fitness         Core Components/Risk Factors/Patient Goals at Discharge (Final Review):      Goals and Risk Factor Review - 05/23/16 1114      Core Components/Risk Factors/Patient Goals Review   Personal Goals Review Increase Strength and Stamina;Other   Review Pt states she is feeling more independent since beginning an exercise program.  She is working with a trainer at the gym 2 days/week.   Expected Outcomes Continue exercise program to improve with ADLs and cardiovascular fitness      ITP Comments:     ITP Comments    Row Name 04/17/16 1330           ITP Comments Medical Director- Dr. Fransico Him, MD          Comments: Leslieann is making expected progress toward personal goals after completing 13 sessions. Recommend continued exercise and life style modification education including  stress management and relaxation techniques to decrease cardiac risk profile. Jahaira is concerned that her heart rate has been to high post exercise.  Resting  heart rate have been in the 90's and the 80's today. Will fax exercise flow sheets to Dr. Elmarie Shiley  office for review.   Latresha is enjoying participating at cardiac rehab.Barnet Pall, RN,BSN 05/24/2016 3:07 PM

## 2016-05-25 ENCOUNTER — Encounter (HOSPITAL_COMMUNITY): Payer: Medicare Other

## 2016-05-25 ENCOUNTER — Encounter (HOSPITAL_COMMUNITY)
Admission: RE | Admit: 2016-05-25 | Discharge: 2016-05-25 | Disposition: A | Discharge status: Home / Self Care | Payer: Medicare Other | Source: Ambulatory Visit | Attending: Cardiovascular Disease | Admitting: Cardiovascular Disease

## 2016-05-25 DIAGNOSIS — Z9889 Other specified postprocedural states: Secondary | ICD-10-CM | POA: Diagnosis not present

## 2016-05-28 ENCOUNTER — Encounter (HOSPITAL_COMMUNITY): Payer: Medicare Other

## 2016-05-28 ENCOUNTER — Other Ambulatory Visit: Payer: Self-pay | Admitting: Nurse Practitioner

## 2016-05-28 ENCOUNTER — Telehealth: Payer: Self-pay | Admitting: Cardiovascular Disease

## 2016-05-28 ENCOUNTER — Encounter (HOSPITAL_COMMUNITY)
Admission: RE | Admit: 2016-05-28 | Discharge: 2016-05-28 | Disposition: A | Discharge status: Home / Self Care | Payer: Medicare Other | Source: Ambulatory Visit | Attending: Cardiovascular Disease | Admitting: Cardiovascular Disease

## 2016-05-28 DIAGNOSIS — Z9889 Other specified postprocedural states: Secondary | ICD-10-CM

## 2016-05-28 MED ORDER — METOPROLOL TARTRATE 25 MG PO TABS: 25.0000 mg | ORAL_TABLET | Freq: Two times a day (BID) | ORAL | 3 refills | Status: DC

## 2016-05-28 NOTE — Telephone Encounter (Signed)
Spoke with patient regarding increasing metoprolol to 25 mg twice daily as originally planned by Dr. Acie Fredrickson.  She states when she goes to cardiac rehab she has to wait for her heart rate to slow down before she can start exercising and when she is finished exercising, she has to wait for her BP to increase.  She states she asked at one of the visits if she should be concerned about this.  I advised her that we received the information and thought that she had concerns that her resting heart rate was too high.  She states she feels fine and would like to continue with current dose of metoprolol at 12.5 mg in the am and 25 mg in the evening if that is agreeable with Dr. Acie Fredrickson.  He is in the office and he verbalized agreement to continue current medications.  She verbalized understanding and thanked me for the call.

## 2016-05-28 NOTE — Telephone Encounter (Signed)
New message  ° ° °Patient calling stating someone called her today returning call back  °

## 2016-05-28 NOTE — Telephone Encounter (Signed)
We received documentation from [address] that patient is concerned that her resting heart rate is too high.  Dr. Acie Fredrickson reviewed her exercise flow sheets and I left a message on patient's cell for her to call back regarding increasing her Metoprolol to 25 mg twice daily. I also spoke with her husband and asked him to have the patient call if she did not receive the message on cell.

## 2016-05-30 ENCOUNTER — Encounter (HOSPITAL_COMMUNITY)
Admission: RE | Admit: 2016-05-30 | Discharge: 2016-05-30 | Disposition: A | Discharge status: Home / Self Care | Payer: Medicare Other | Source: Ambulatory Visit | Attending: Cardiovascular Disease | Admitting: Cardiovascular Disease

## 2016-05-30 ENCOUNTER — Encounter (HOSPITAL_COMMUNITY): Payer: Medicare Other

## 2016-05-30 DIAGNOSIS — Z9889 Other specified postprocedural states: Secondary | ICD-10-CM

## 2016-05-30 NOTE — Progress Notes (Signed)
Destiny Andersen [id number] 70 y.o. female Nutrition Note Spoke with pt. Nutrition Survey reviewed with pt. Pt is following Step 2 of the Therapeutic Lifestyle Changes diet. Pt expressed understanding of the information reviewed. Pt has attended nutrition classes. Lab Results  Component Value Date   HGBA1C 5.5 01/30/2016   Wt Readings from Last 3 Encounters:  04/18/16 124 lb 9 oz (56.5 kg)  04/09/16 123 lb (55.8 kg)  04/09/16 124 lb 6.4 oz (56.4 kg)   Nutrition Diagnosis ? Food-and nutrition-related knowledge deficit related to lack of exposure to information as related to diagnosis of: ? CVD ? Pre-DM Nutrition Intervention ? Benefits of adopting Therapeutic Lifestyle Changes discussed when Medficts reviewed. ? Pt to attend the Portion Distortion class ? Pt to attend the  ? Nutrition I class - met; 04/24/16                     ? Nutrition II class - met; 05/01/16 ? Continue client-centered nutrition education by RD, as part of interdisciplinary care.  Goal(s) ? Pt to describe the benefit of including fruits, vegetables, whole grains, and low-fat dairy products in a heart healthy meal plan.  Monitor and Evaluate progress toward nutrition goal with team.  Derek Mound, M.Ed, RD, LDN, CDE 05/30/2016 11:39 AM

## 2016-05-31 ENCOUNTER — Telehealth: Payer: Self-pay | Admitting: Cardiovascular Disease

## 2016-05-31 ENCOUNTER — Other Ambulatory Visit: Payer: Self-pay | Admitting: Cardiovascular Disease

## 2016-05-31 MED ORDER — CLINDAMYCIN HCL 300 MG PO CAPS: 600.0000 mg | ORAL_CAPSULE | ORAL | 3 refills | Status: DC

## 2016-05-31 NOTE — Telephone Encounter (Signed)
Left message for patient that Rx for clindamycin is going to local pharmacy and asked her to call back to verify pharmacy.  Patient left me a message of which pharmacy to use and Rx has been sent.

## 2016-05-31 NOTE — Telephone Encounter (Signed)
New Message  Pt call with a new Pharmacy  [facility] [place] [place] [id number]  Please call back to discuss if needed

## 2016-05-31 NOTE — Telephone Encounter (Signed)
New Message:    Pt says she is going to have dental work.She says she needs a prescription for an antibiotic for her dental work.Pt had open heart surgery in August.

## 2016-06-01 ENCOUNTER — Encounter (HOSPITAL_COMMUNITY)
Admission: RE | Admit: 2016-06-01 | Discharge: 2016-06-01 | Disposition: A | Discharge status: Home / Self Care | Payer: Medicare Other | Source: Ambulatory Visit | Attending: Cardiovascular Disease | Admitting: Cardiovascular Disease

## 2016-06-01 ENCOUNTER — Encounter (HOSPITAL_COMMUNITY): Payer: Medicare Other

## 2016-06-01 DIAGNOSIS — Z9889 Other specified postprocedural states: Secondary | ICD-10-CM | POA: Diagnosis not present

## 2016-06-04 ENCOUNTER — Encounter (HOSPITAL_COMMUNITY)
Admission: RE | Admit: 2016-06-04 | Discharge: 2016-06-04 | Disposition: A | Discharge status: Home / Self Care | Payer: Medicare Other | Source: Ambulatory Visit | Attending: Cardiovascular Disease | Admitting: Cardiovascular Disease

## 2016-06-04 ENCOUNTER — Encounter (HOSPITAL_COMMUNITY): Payer: Medicare Other

## 2016-06-04 DIAGNOSIS — Z9889 Other specified postprocedural states: Secondary | ICD-10-CM

## 2016-06-04 NOTE — Progress Notes (Signed)
Reviewed Home Exercise Program with pt.  Discussed mode/frequency/duration of exercise, THRR, RPE and weather conditions for exercising outdoors.  Also discussed signs and symptoms and when to call Dr/911.  Pt verbalized understanding.  Cleda Mccreedy, MS 06/04/2016 11:51

## 2016-06-06 ENCOUNTER — Encounter (HOSPITAL_COMMUNITY)
Admission: RE | Admit: 2016-06-06 | Discharge: 2016-06-06 | Disposition: A | Discharge status: Home / Self Care | Payer: Medicare Other | Source: Ambulatory Visit | Attending: Cardiovascular Disease | Admitting: Cardiovascular Disease

## 2016-06-06 ENCOUNTER — Encounter (HOSPITAL_COMMUNITY): Payer: Medicare Other

## 2016-06-06 DIAGNOSIS — Z9889 Other specified postprocedural states: Secondary | ICD-10-CM | POA: Diagnosis not present

## 2016-06-08 ENCOUNTER — Encounter (HOSPITAL_COMMUNITY): Payer: Medicare Other

## 2016-06-08 ENCOUNTER — Encounter (HOSPITAL_COMMUNITY)
Admission: RE | Admit: 2016-06-08 | Discharge: 2016-06-08 | Disposition: A | Discharge status: Home / Self Care | Payer: Medicare Other | Source: Ambulatory Visit | Attending: Cardiovascular Disease | Admitting: Cardiovascular Disease

## 2016-06-08 DIAGNOSIS — Z9889 Other specified postprocedural states: Secondary | ICD-10-CM

## 2016-06-13 ENCOUNTER — Encounter (HOSPITAL_COMMUNITY): Payer: Medicare Other

## 2016-06-13 ENCOUNTER — Encounter (HOSPITAL_COMMUNITY)
Admission: RE | Admit: 2016-06-13 | Discharge: 2016-06-13 | Disposition: A | Discharge status: Home / Self Care | Payer: Medicare Other | Source: Ambulatory Visit | Attending: Cardiovascular Disease | Admitting: Cardiovascular Disease

## 2016-06-13 DIAGNOSIS — Z9889 Other specified postprocedural states: Secondary | ICD-10-CM | POA: Diagnosis not present

## 2016-06-15 ENCOUNTER — Encounter (HOSPITAL_COMMUNITY): Payer: Medicare Other

## 2016-06-15 ENCOUNTER — Encounter (HOSPITAL_COMMUNITY)
Admission: RE | Admit: 2016-06-15 | Discharge: 2016-06-15 | Disposition: A | Discharge status: Home / Self Care | Payer: Medicare Other | Source: Ambulatory Visit | Attending: Cardiovascular Disease | Admitting: Cardiovascular Disease

## 2016-06-15 DIAGNOSIS — Z9889 Other specified postprocedural states: Secondary | ICD-10-CM

## 2016-06-19 NOTE — Progress Notes (Signed)
Cardiac Individual Treatment Plan  Patient Details  Name: Destiny Andersen MRN: [id number] Date of Birth: 1946-05-20 Referring Provider:   Flowsheet Row CARDIAC REHAB PHASE II ORIENTATION from 04/17/2016 in [place]  Referring Provider  [name], Arnette Norris, MD      Initial Encounter Date:  Flowsheet Row CARDIAC REHAB PHASE II ORIENTATION from 04/17/2016 in [place]  Date  04/17/16  Referring Provider  [name], Arnette Norris, MD      Visit Diagnosis: S/P mitral valve repair  Patient's Home Medications on Admission:  Current Outpatient Prescriptions:  .  aspirin 81 MG tablet, Take 81 mg by mouth daily., Disp: , Rfl:  .  calcium citrate-vitamin D (CITRACAL+D) 315-200 MG-UNIT tablet, Take 1 tablet by mouth 3 (three) times daily. VITAMIN D DOSAGE IS 250MG INSTEAD OF 200, Disp: , Rfl:  .  clindamycin (CLEOCIN) 300 MG capsule, Take 2 capsules (600 mg total) by mouth as directed. Take 1 hour prior to dental work, Disp: 2 capsule, Rfl: 3 .  metoprolol tartrate (LOPRESSOR) 25 MG tablet, Take 1 tablet (25 mg total) by mouth 2 (two) times daily. Take 1/2 tab (12.5 mg) in the morning and 25 mg in the evening, Disp: 135 tablet, Rfl: 3 .  warfarin (COUMADIN) 2.5 MG tablet, Take 1 to 1.5 tablets by mouth daily as directed by coumadin clinic, Disp: 45 tablet, Rfl: 3  Past Medical History: Past Medical History:  Diagnosis Date  . Cancer [facility] 1983   Cervical cancer.  Skin Cancer- leg  . Chronic headaches    "? Migraines"- does not have then any more  . Constipation   . Elevated C-reactive protein (CRP)   . Family history of breast cancer    Mother  . Fatigue   . Heart murmur    ECHO 02/10/13 EF = 60-65%, moderate posterior mitral valve prolapse w/possible flail segment, severe MR, mitral regurgitant jet is anteriorly directed.  Marland Kitchen History of blood transfusion    pre-eclampsia   . Hyperpotassemia    Migrated  . Medicare annual wellness  visit, subsequent   . Mitral valve prolapse   . MR (mitral regurgitation)    ECHO 02/10/13 EF = 60-65%, moderate posterior mitral valve prolapse w/possible flail segment, severe MR, mitral regurgitant jet is anteriorly directed.  . Risk for falls   . Routine general medical examination at a health care facility   . S/P minimally invasive mitral valve repair 02/01/2016   Complex valvuloplasty including triangular resection of flail posterior leaflet, artificial Gore-tex neochord placement x10 and 28 mm Sorin Memo 3D ring annuloplasty via right mini thoracotomy approach  . Seizures ([place])    with pre- echa  . Shortness of breath dyspnea    with exertion    Tobacco Use: History  Smoking Status  . Former Smoker  . Years: 25.00  . Types: Cigarettes  . Quit date: 06/18/1985  Smokeless Tobacco  . Never Used    Labs: Recent Review Flowsheet Data    Labs for ITP Cardiac and Pulmonary Rehab Latest Ref Rng & Units 02/01/2016 02/02/2016 02/02/2016 02/02/2016 02/02/2016   Hemoglobin A1c 4.8 - 5.6 % - - - - -   PHART 7.350 - 7.450 - 7.388 7.428 7.350 -   PCO2ART 35.0 - 45.0 mmHg - 40.9 40.5 40.7 -   HCO3 20.0 - 24.0 mEq/L - 24.8(H) 26.8(H) 22.5 -   TCO2 0 - 100 mmol/L _0 ACIDBASEDEF 0.0 - 2.0  mmol/L - - - 3.0(H) -   O2SAT % - 98.0 98.0 97.0 -      Capillary Blood Glucose: Lab Results  Component Value Date   GLUCAP 96 02/03/2016   GLUCAP 85 02/02/2016   GLUCAP 121 (H) 02/02/2016   GLUCAP 136 (H) 02/02/2016   GLUCAP 155 (H) 02/02/2016     Exercise Target Goals:    Exercise Program Goal: Individual exercise prescription set with THRR, safety & activity barriers. Participant demonstrates ability to understand and report RPE using BORG scale, to self-measure pulse accurately, and to acknowledge the importance of the exercise prescription.  Exercise Prescription Goal: Starting with aerobic activity 30 plus minutes a day, 3 days per week for initial exercise prescription.  Provide home exercise prescription and guidelines that participant acknowledges understanding prior to discharge.  Activity Barriers & Risk Stratification:     Activity Barriers & Cardiac Risk Stratification - 04/17/16 1441      Activity Barriers & Cardiac Risk Stratification   Activity Barriers None   Cardiac Risk Stratification Moderate      6 Minute Walk:     6 Minute Walk    Row Name 04/18/16 1636         6 Minute Walk   Phase Initial     Distance 1756 feet     Walk Time 6 minutes     # of Rest Breaks 0     MPH 3.33     METS 3.98     RPE 9     VO2 Peak 13.93     Symptoms No     Resting HR 97 bpm     Resting BP 109/73     Max Ex. HR 118 bpm     Max Ex. BP 112/70     2 Minute Post BP 102/70        Initial Exercise Prescription:     Initial Exercise Prescription - 04/18/16 1600      Date of Initial Exercise RX and Referring Provider   Date 04/17/16   Referring Provider [name], Arnette Norris, MD     Treadmill   MPH 2.8   Grade 1   Minutes 10   METs 3.53     Bike   Level 0.9   Minutes 10   METs 4     NuStep   Level 2   Minutes 10   METs 3     Prescription Details   Frequency (times per week) 3   Duration Progress to 30 minutes of continuous aerobic without signs/symptoms of physical distress     Intensity   THRR 40-80% of Max Heartrate 60-119   Ratings of Perceived Exertion 11-13   Perceived Dyspnea 0-4     Progression   Progression Continue to progress workloads to maintain intensity without signs/symptoms of physical distress.     Resistance Training   Training Prescription Yes   Weight 2lbs.   Reps 10-12      Perform Capillary Blood Glucose checks as needed.  Exercise Prescription Changes:      Exercise Prescription Changes    Row Name 04/23/16 1200 05/23/16 1100 06/20/16 0800         Exercise Review   Progression  -  - Yes       Response to Exercise   Blood Pressure (Admit) 96/72 102/58 104/70     Blood Pressure (Exercise)  108/70 120/70 104/70     Blood Pressure (Exit) 104/60 92/64 103/69     Heart  Rate (Admit) 102 bpm 93 bpm 83 bpm     Heart Rate (Exercise) 128 bpm 113 bpm 111 bpm     Heart Rate (Exit) 100 bpm 92 bpm 91 bpm     Rating of Perceived Exertion (Exercise)  - 11 13     Duration  - Progress to 45 minutes of aerobic exercise without signs/symptoms of physical distress Progress to 45 minutes of aerobic exercise without signs/symptoms of physical distress     Intensity THRR unchanged  - THRR unchanged       Progression   Progression Continue to progress workloads to maintain intensity without signs/symptoms of physical distress.  - Continue to progress workloads to maintain intensity without signs/symptoms of physical distress.     Average METs 3.31 3.5 3.8       Resistance Training   Training Prescription Yes Yes Yes     Weight 2lbs. 2lbs. 3lbs     Reps 10-12 10-12 10-12       Treadmill   MPH 2.8 3.4 3.5     Grade _0 Minutes 10 10  -     METs 3.53 4.54 4.2       Bike   Level 0.9 1 2.5     Minutes _1 METs 4 2.6 3.2       NuStep   Level _2 Minutes _3 METs 2.4 3.3 3.7       Home Exercise Plan   Plans to continue exercise at  -  - Home     Frequency  -  - Add 3 additional days to program exercise sessions.        Exercise Comments:      Exercise Comments    Row Name 04/23/16 1211 05/23/16 1114 06/20/16 1122       Exercise Comments Pt is off to a great start with exercise  Reviewed METs and goals with pt.  Continues to do well with exercise Reviewed METs and goals with pt.  Continues to do well with exercise        Discharge Exercise Prescription (Final Exercise Prescription Changes):     Exercise Prescription Changes - 06/20/16 0800      Exercise Review   Progression Yes     Response to Exercise   Blood Pressure (Admit) 104/70   Blood Pressure (Exercise) 104/70   Blood Pressure (Exit) 103/69   Heart Rate (Admit) 83 bpm   Heart Rate  (Exercise) 111 bpm   Heart Rate (Exit) 91 bpm   Rating of Perceived Exertion (Exercise) 13   Duration Progress to 45 minutes of aerobic exercise without signs/symptoms of physical distress   Intensity THRR unchanged     Progression   Progression Continue to progress workloads to maintain intensity without signs/symptoms of physical distress.   Average METs 3.8     Resistance Training   Training Prescription Yes   Weight 3lbs   Reps 10-12     Treadmill   MPH 3.5   Grade 1   METs 4.2     Bike   Level 2.5   Minutes 10   METs 3.2     NuStep   Level 5   Minutes 10   METs 3.7     Home Exercise Plan   Plans to continue exercise at Home   Frequency Add 3 additional days to program exercise  sessions.      Nutrition:  Target Goals: Understanding of nutrition guidelines, daily intake of sodium <157m, cholesterol <2086m calories 30% from fat and 7% or less from saturated fats, daily to have 5 or more servings of fruits and vegetables.  Biometrics:     Pre Biometrics - 04/18/16 1645      Pre Biometrics   Height _0  (1.651 m)   Weight 124 lb 9 oz (56.5 kg)   Waist Circumference 27 inches   Hip Circumference 36.25 inches   Waist to Hip Ratio 0.74 %   BMI (Calculated) 20.8   Triceps Skinfold 24 mm   % Body Fat 21.2 %   Grip Strength 27.5 kg   Flexibility 17.5 in   Single Leg Stand 30 seconds       Nutrition Therapy Plan and Nutrition Goals:     Nutrition Therapy & Goals - 04/18/16 1000      Nutrition Therapy   Diet Therapeutic Lifestyle Changes     Personal Nutrition Goals   Personal Goal #1 [place] individualized specific dietary modifications aiming towards targeted core components such as weight, hypertension, lipid management, diabetes, heart failure and other comorbidities.   Expected Outcomes Short Term Goal: Understand basic principles  of dietary content, such as calories, fat, sodium, cholesterol and nutrients.;Long Term Goal: Adherence to prescribed nutrition plan.      Nutrition Discharge: Nutrition Scores:     Nutrition Assessments - 04/18/16 1000      MEDFICTS Scores   Pre Score 13      Nutrition Goals Re-Evaluation:   Psychosocial: Target Goals: Acknowledge presence or absence of depression, maximize coping skills, provide positive support system. Participant is able to verbalize types and ability to use techniques and skills needed for reducing stress and depression.  Initial Review & Psychosocial Screening:     Initial Psych Review & Screening - 04/19/16 [place]     Barriers   Psychosocial barriers to participate in program There are no identifiable barriers or psychosocial needs.     Screening Interventions   Interventions Encouraged to exercise      Quality of Life Scores:     Quality of Life - 04/17/16 1627      Quality of Life Scores   Health/Function Pre 25.38 %   Socioeconomic Pre 27.5 %   Psych/Spiritual Pre 20.57 %   Family Pre 23.63 %   GLOBAL Pre 24.45 %      PHQ-9: Recent Review Flowsheet Data    [facility] 06/19/2016 04/23/2016   Decreased Interest 0 0   Down, Depressed, Hopeless 0 0   PHQ - 2 Score 0 0      Psychosocial Evaluation and Intervention:   Psychosocial Re-Evaluation:     Psychosocial Re-Evaluation    Row Name 04/25/16 1805 05/23/16 1108 06/19/16 1209         Psychosocial Re-Evaluation   Interventions Encouraged to attend Cardiac Rehabilitation for the exercise Encouraged to attend Cardiac Rehabilitation for the exercise Encouraged to attend Cardiac Rehabilitation for the exercise     Continued Psychosocial Services Needed No No No        Vocational Rehabilitation: Provide vocational rehab assistance to qualifying candidates.   Vocational Rehab Evaluation & Intervention:      Vocational Rehab -  04/19/16 1514      Initial Vocational Rehab Evaluation & Intervention   Assessment shows need for Vocational Rehabilitation No  Sherrica is retired      Education: Education Goals: Education classes will be provided on a weekly basis, covering required topics. Participant will state understanding/return demonstration of topics presented.  Learning Barriers/Preferences:     Learning Barriers/Preferences - 04/18/16 1643      Learning Barriers/Preferences   Learning Barriers Sight   Learning Preferences Audio;Skilled Demonstration      Education Topics: Count Your Pulse:  -Group instruction provided by verbal instruction, demonstration, patient participation and written materials to support subject.  Instructors address importance of being able to find your pulse and how to count your pulse when at home without a heart monitor.  Patients get hands on experience counting their pulse with staff help and individually.   Heart Attack, Angina, and Risk Factor Modification:  -Group instruction provided by verbal instruction, video, and written materials to support subject.  Instructors address signs and symptoms of angina and heart attacks.    Also discuss risk factors for heart disease and how to make changes to improve heart health risk factors. Flowsheet Row CARDIAC REHAB PHASE II EXERCISE from 06/20/2016 in [place]  Date  05/25/16  Instruction Review Code  2- meets goals/outcomes      Functional Fitness:  -Group instruction provided by verbal instruction, demonstration, patient participation, and written materials to support subject.  Instructors address safety measures for doing things around the house.  Discuss how to get up and down off the floor, how to pick things up properly, how to safely get out of a chair without assistance, and balance training.   Meditation and Mindfulness:  -Group instruction provided by verbal  instruction, patient participation, and written materials to support subject.  Instructor addresses importance of mindfulness and meditation practice to help reduce stress and improve awareness.  Instructor also leads participants through a meditation exercise.  Flowsheet Row CARDIAC REHAB PHASE II EXERCISE from 06/20/2016 in [place]  Date  06/13/16  Instruction Review Code  2- meets goals/outcomes      Stretching for Flexibility and Mobility:  -Group instruction provided by verbal instruction, patient participation, and written materials to support subject.  Instructors lead participants through series of stretches that are designed to increase flexibility thus improving mobility.  These stretches are additional exercise for major muscle groups that are typically performed during regular warm up and cool down. Flowsheet Row CARDIAC REHAB PHASE II EXERCISE from 06/20/2016 in [place]  Date  05/23/16  Educator  Seward Carol  Instruction Review Code  2- meets goals/outcomes      Hands Only CPR Anytime:  -Group instruction provided by verbal instruction, video, patient participation and written materials to support subject.  Instructors co-teach with [facility] video for hands only CPR.  Participants get hands on experience with mannequins.   Nutrition I class: Heart Healthy Eating:  -Group instruction provided by PowerPoint slides, verbal discussion, and written materials to support subject matter. The instructor gives an explanation and review of the Therapeutic Lifestyle Changes diet recommendations, which includes a discussion on lipid goals, dietary fat, sodium, fiber, plant stanol/sterol esters, sugar, and the components of a well-balanced, healthy diet. Flowsheet Row CARDIAC REHAB PHASE II EXERCISE from 06/20/2016 in [place]  Date  04/24/16  Educator  RD  Instruction Review Code  2-  meets  goals/outcomes      Nutrition II class: Lifestyle Skills:  -Group instruction provided by PowerPoint slides, verbal discussion, and written materials to support subject matter. The instructor gives an explanation and review of label reading, grocery shopping for heart health, heart healthy recipe modifications, and ways to make healthier choices when eating out. Flowsheet Row CARDIAC REHAB PHASE II EXERCISE from 06/20/2016 in [place]  Date  05/01/16  Educator  RD  Instruction Review Code  2- meets goals/outcomes      Diabetes Question & Answer:  -Group instruction provided by PowerPoint slides, verbal discussion, and written materials to support subject matter. The instructor gives an explanation and review of diabetes co-morbidities, pre- and post-prandial blood glucose goals, pre-exercise blood glucose goals, signs, symptoms, and treatment of hypoglycemia and hyperglycemia, and foot care basics.   Diabetes Blitz:  -Group instruction provided by PowerPoint slides, verbal discussion, and written materials to support subject matter. The instructor gives an explanation and review of the physiology behind type 1 and type 2 diabetes, diabetes medications and rational behind using different medications, pre- and post-prandial blood glucose recommendations and Hemoglobin A1c goals, diabetes diet, and exercise including blood glucose guidelines for exercising safely.    Portion Distortion:  -Group instruction provided by PowerPoint slides, verbal discussion, written materials, and food models to support subject matter. The instructor gives an explanation of serving size versus portion size, changes in portions sizes over the last 20 years, and what consists of a serving from each food group. Flowsheet Row CARDIAC REHAB PHASE II EXERCISE from 06/20/2016 in [place]  Date  06/20/16  Educator  RD  Instruction Review Code  2- meets  goals/outcomes      Stress Management:  -Group instruction provided by verbal instruction, video, and written materials to support subject matter.  Instructors review role of stress in heart disease and how to cope with stress positively.   Flowsheet Row CARDIAC REHAB PHASE II EXERCISE from 06/20/2016 in [place]  Date  05/02/16  Instruction Review Code  2- meets goals/outcomes      Exercising on Your Own:  -Group instruction provided by verbal instruction, power point, and written materials to support subject.  Instructors discuss benefits of exercise, components of exercise, frequency and intensity of exercise, and end points for exercise.  Also discuss use of nitroglycerin and activating EMS.  Review options of places to exercise outside of rehab.  Review guidelines for sex with heart disease. Flowsheet Row CARDIAC REHAB PHASE II EXERCISE from 06/20/2016 in [place]  Date  05/04/16  Instruction Review Code  2- meets goals/outcomes      Cardiac Drugs I:  -Group instruction provided by verbal instruction and written materials to support subject.  Instructor reviews cardiac drug classes: antiplatelets, anticoagulants, beta blockers, and statins.  Instructor discusses reasons, side effects, and lifestyle considerations for each drug class. Flowsheet Row CARDIAC REHAB PHASE II EXERCISE from 06/20/2016 in [place]  Date  05/30/16  Educator  pharmacist  Instruction Review Code  2- meets goals/outcomes      Cardiac Drugs II:  -Group instruction provided by verbal instruction and written materials to support subject.  Instructor reviews cardiac drug classes: angiotensin converting enzyme inhibitors (ACE-I), angiotensin II receptor blockers (ARBs), nitrates, and calcium channel blockers.  Instructor discusses reasons, side effects, and lifestyle considerations for each drug class. Flowsheet Row  CARDIAC REHAB PHASE II EXERCISE from 06/20/2016 in [place]  Date  04/25/16  Educator  Kennyth Lose [facility]  Instruction Review Code  2- meets goals/outcomes      Anatomy and Physiology of the Circulatory System:  -Group instruction provided by verbal instruction, video, and written materials to support subject.  Reviews functional anatomy of heart, how it relates to various diagnoses, and what role the heart plays in the overall system.   Knowledge Questionnaire Score:     Knowledge Questionnaire Score - 04/17/16 1626      Knowledge Questionnaire Score   Pre Score 22/24      Core Components/Risk Factors/Patient Goals at Admission:     Personal Goals and Risk Factors at Admission - 04/18/16 1632      Core Components/Risk Factors/Patient Goals on Admission   Hypertension Yes   Intervention Provide education on lifestyle modifcations including regular physical activity/exercise, weight management, moderate sodium restriction and increased consumption of fresh fruit, vegetables, and low fat dairy, alcohol moderation, and smoking cessation.;Monitor prescription use compliance.   Expected Outcomes Short Term: Continued assessment and intervention until BP is < 140/6m HG in hypertensive participants. < 130/89mHG in hypertensive participants with diabetes, heart failure or chronic kidney disease.;Long Term: Maintenance of blood pressure at goal levels.   Personal Goal Other Yes   Personal Goal Return to previous state of health prior to surgery.   Intervention Develop an individualized exercise program including stretching, aerobic and resistance training program to help improve functional capacity and cardiorespiratory fitness, so that participant can resume previous activities that she was able to participate in prior to surgery.   Expected Outcomes Paticipant will be able resume previous activities of daily living that she was able to engage in prior to  surgery.      Core Components/Risk Factors/Patient Goals Review:      Goals and Risk Factor Review    Row Name 04/23/16 1212 05/23/16 1114 06/20/16 1123         Core Components/Risk Factors/Patient Goals Review   Personal Goals Review Increase Strength and Stamina;Other Increase Strength and Stamina;Other Increase Strength and Stamina;Other     Review Continue with individualized exercise program to help improve functional capacity and cardiorespiratory fitness Pt states she is feeling more independent since beginning an exercise program.  She is working with a trainer at the gym 2 days/week. Pt is working with trainer 3 days/wk and she's tolerating workload increases well.       Expected Outcomes Be able to resume to previous ADLs Continue exercise program to improve with ADLs and cardiovascular fitness Continue exercise program to improve with ADLs and cardiovascular fitness        Core Components/Risk Factors/Patient Goals at Discharge (Final Review):      Goals and Risk Factor Review - 06/20/16 1123      Core Components/Risk Factors/Patient Goals Review   Personal Goals Review Increase Strength and Stamina;Other   Review Pt is working with trainer 3 days/wk and she's tolerating workload increases well.     Expected Outcomes Continue exercise program to improve with ADLs and cardiovascular fitness      ITP Comments:     ITP Comments    Row Name 04/17/16 1330 06/15/16 1234         ITP Comments Medical Director- Dr. TrFransico HimMD attended CPR education class.  outcomes and goals met          Comments: [place] making expected  progress toward personal goals after completing 23 sessions. Recommend continued exercise and life style modification education including  stress management and relaxation techniques to decrease cardiac risk profile. Joannie is doing well with exercise and is enjoying participating in the [place] RN BSN

## 2016-06-20 ENCOUNTER — Encounter (HOSPITAL_COMMUNITY): Payer: Medicare Other

## 2016-06-20 ENCOUNTER — Encounter (HOSPITAL_COMMUNITY)
Admission: RE | Admit: 2016-06-20 | Discharge: 2016-06-20 | Disposition: A | Discharge status: Home / Self Care | Payer: Medicare Other | Source: Ambulatory Visit | Attending: Cardiovascular Disease | Admitting: Cardiovascular Disease

## 2016-06-20 DIAGNOSIS — Z9889 Other specified postprocedural states: Secondary | ICD-10-CM | POA: Diagnosis present

## 2016-06-20 DIAGNOSIS — I340 Nonrheumatic mitral (valve) insufficiency: Secondary | ICD-10-CM | POA: Insufficient documentation

## 2016-06-22 ENCOUNTER — Encounter (HOSPITAL_COMMUNITY): Payer: Medicare Other

## 2016-06-22 ENCOUNTER — Encounter (HOSPITAL_COMMUNITY)
Admission: RE | Admit: 2016-06-22 | Discharge: 2016-06-22 | Disposition: A | Discharge status: Home / Self Care | Payer: Medicare Other | Source: Ambulatory Visit | Attending: Cardiovascular Disease | Admitting: Cardiovascular Disease

## 2016-06-22 DIAGNOSIS — Z9889 Other specified postprocedural states: Secondary | ICD-10-CM | POA: Diagnosis not present

## 2016-06-25 ENCOUNTER — Ambulatory Visit (INDEPENDENT_AMBULATORY_CARE_PROVIDER_SITE_OTHER): Payer: Medicare Other | Admitting: Cardiovascular Disease

## 2016-06-25 ENCOUNTER — Encounter: Payer: Self-pay | Admitting: Cardiovascular Disease

## 2016-06-25 ENCOUNTER — Encounter (HOSPITAL_COMMUNITY): Payer: Medicare Other

## 2016-06-25 ENCOUNTER — Ambulatory Visit: Payer: Medicare Other | Admitting: Cardiovascular Disease

## 2016-06-25 ENCOUNTER — Encounter (INDEPENDENT_AMBULATORY_CARE_PROVIDER_SITE_OTHER): Payer: Self-pay

## 2016-06-25 ENCOUNTER — Encounter (HOSPITAL_COMMUNITY)
Admission: RE | Admit: 2016-06-25 | Discharge: 2016-06-25 | Disposition: A | Discharge status: Home / Self Care | Payer: Medicare Other | Source: Ambulatory Visit | Attending: Cardiovascular Disease | Admitting: Cardiovascular Disease

## 2016-06-25 VITALS — BP 100/70 | HR 88 | Ht 65.0 in | Wt 127.8 lb

## 2016-06-25 DIAGNOSIS — Z9889 Other specified postprocedural states: Secondary | ICD-10-CM | POA: Diagnosis not present

## 2016-06-25 DIAGNOSIS — I341 Nonrheumatic mitral (valve) prolapse: Secondary | ICD-10-CM | POA: Diagnosis not present

## 2016-06-25 NOTE — Progress Notes (Signed)
Cardiology Office Note    Date:  06/25/2016   ID:  LOTA PERLMAN, DOB 1945-12-02, MRN [id number]  PCP:  Marjorie Smolder, MD  Cardiologist:  Dr. Acie Fredrickson  Chief Complaint: s/p MV repair  History of Present Illness:   Destiny Andersen is a 71 y.o. female with a history of mitral valve prolapse with severe MR s/p minimally invasive mitral valve repair and PFO closure who walked in for heart racing.  The patient reportedly was told that she had mitral valve prolapse many years ago. Approximately 3 years ago she was noted by her primary care physician to have a prominent murmur on physical exam and echocardiogram revealed the presence of mitral valve prolapse with severe mitral regurgitation. She has been followed carefully by Dr. Acie Fredrickson ever since. She has remained physically active and reportedly asymptomatic. She was seen in follow-up recently by Dr. Acie Fredrickson and transthoracic echocardiogram revealed mitral valve prolapse with a flail segment of the posterior leaflet of mitral valve and severe mitral regurgitation. Left ventricular size and systolic function remains normal. Transesophageal echocardiogram was performed, confirming the presence of a flail segment of the posterior leaflet with ruptured chordae tendineae. L/RHC on 12/21/15 showed normal coronary arteries, normal LV function and severe MR. The patient was referred for surgical consultation.   She ultimately underwent minimally invasive mitral valve repair and PFO closure with Dr. Roxy Manns on 02/01/16. Her post op course was c/b post op thrombocytopenia and volume overload requiring diuresis. The patient was not discharged from the hospital on a beta blocker because of bradycardia during the first couple days after surgery.  Last seen in clinic 02/17/16 for  for follow up. She has been feeling pretty good since the surgery except her back hurts. EKG showed sinus tachycardia at rate of 103 bpm.  The patient was started on metorpolol 12.5mg  BID  due to tachycardia by Dr. Clydene Laming 02/21/16.   Came for INR check and while walking back to car in parking lot she noted HR of 110 and came back for further evaluation. The patient denies nausea, vomiting, fever, chest pain, palpitations, shortness of breath, orthopnea, PND, dizziness, syncope, cough, congestion, abdominal pain, hematochezia, melena, lower extremity edema. Had one episode of throat sensation this morning.   Jan. 8, 2018:  Destiny Andersen is doing well.   we had increased her metoprolol slightly at her last visit  Wears her fit bit and her HR is much better  Doing cardiac rehab.   Doing well.    Has some scar tenderness and rib tenderness.   Past Medical History:  Diagnosis Date  . Cancer [facility]) 1983   Cervical cancer.  Skin Cancer- leg  . Chronic headaches    "? Migraines"- does not have then any more  . Constipation   . Elevated C-reactive protein (CRP)   . Family history of breast cancer    Mother  . Fatigue   . Heart murmur    ECHO 02/10/13 EF = 60-65%, moderate posterior mitral valve prolapse w/possible flail segment, severe MR, mitral regurgitant jet is anteriorly directed.  Marland Kitchen History of blood transfusion    pre-eclampsia   . Hyperpotassemia    Migrated  . Medicare annual wellness visit, subsequent   . Mitral valve prolapse   . MR (mitral regurgitation)    ECHO 02/10/13 EF = 60-65%, moderate posterior mitral valve prolapse w/possible flail segment, severe MR, mitral regurgitant jet is anteriorly directed.  . Risk for falls   . Routine general medical examination at  a health care facility   . S/P minimally invasive mitral valve repair 02/01/2016   Complex valvuloplasty including triangular resection of flail posterior leaflet, artificial Gore-tex neochord placement x10 and 28 mm Sorin Memo 3D ring annuloplasty via right mini thoracotomy approach  . Seizures ([place])    with pre- echa  . Shortness of breath dyspnea    with exertion    Past Surgical History:  Procedure  Laterality Date  . BREAST BIOPSY Right 1998   benign cyst  . CARDIAC CATHETERIZATION N/A 12/21/2015   Procedure: Right/Left Heart Cath and Coronary Angiography;  Surgeon: Sherren Mocha, MD;  Location: [place] [facility];  Service: Cardiovascular;  Laterality: N/A;  . COLONOSCOPY  08/2001   Colon cancer screening in 01/2012  . LAPAROSCOPIC CHOLECYSTECTOMY  1990  . MITRAL VALVE REPAIR Right 02/01/2016   Procedure: MINIMALLY INVASIVE MITRAL VALVE REPAIR (MVR) with closure of PFO;  Surgeon: Rexene Alberts, MD;  Location: [place];  Service: Open Heart Surgery;  Laterality: Right;  . TEE WITHOUT CARDIOVERSION N/A 10/14/2015   Procedure: TRANSESOPHAGEAL ECHOCARDIOGRAM (TEE);  Surgeon: Thayer Headings, MD;  Location: [place];  Service: Cardiovascular;  Laterality: N/A;  . TEE WITHOUT CARDIOVERSION N/A 02/01/2016   Procedure: TRANSESOPHAGEAL ECHOCARDIOGRAM (TEE);  Surgeon: Rexene Alberts, MD;  Location: [place];  Service: Open Heart Surgery;  Laterality: N/A;  . TOTAL ABDOMINAL HYSTERECTOMY  1983   Cervical cancer 1983    Current Medications: Prior to Admission medications   Medication Sig Start Date End Date Taking? Authorizing Provider  acetaminophen (TYLENOL) 325 MG tablet Take 650 mg by mouth every 6 (six) hours as needed.    Historical Provider, MD  aspirin 81 MG tablet Take 81 mg by mouth daily.    Historical Provider, MD  calcium citrate-vitamin D (CITRACAL+D) 315-200 MG-UNIT tablet Take 1 tablet by mouth 3 (three) times daily. VITAMIN D DOSAGE IS 250MG  INSTEAD OF 200    Historical Provider, MD  diclofenac sodium (VOLTAREN) 1 % GEL Apply 2 g topically as needed (back pain). 02/07/16   Donielle Liston Alba, PA-C  metoprolol tartrate (LOPRESSOR) 25 MG tablet Take 0.5 tablets (12.5 mg total) by mouth 2 (two) times daily. 02/21/16   Rexene Alberts, MD  traMADol (ULTRAM) 50 MG tablet Take 1 tablet (50 mg total) by mouth every 6 (six) hours as needed for moderate pain. 02/07/16   Donielle Liston Alba,  PA-C  warfarin (COUMADIN) 2.5 MG tablet Take 1 to 1.5 tablets by mouth daily as directed by coumadin clinic 03/09/16   Thayer Headings, MD    Allergies:   Penicillins; Codeine; Doxycycline hyclate; and Ultram [tramadol]   Social History   Social History  . Marital status: Married    Spouse name: N/A  . Number of children: 1  . Years of education: N/A   Social History Main Topics  . Smoking status: Former Smoker    Years: 25.00    Types: Cigarettes    Quit date: 06/18/1985  . Smokeless tobacco: Never Used  . Alcohol use 1.0 oz/week    2 Standard drinks or equivalent per week     Comment: rare  . Drug use: No  . Sexual activity: No   Other Topics Concern  . None   Social History Narrative  . None     Family History:  The patient's family history includes AAA (abdominal aortic aneurysm) in her father; Breast cancer in her mother; CAD in her father and mother; Heart disease in  her father and mother; High Cholesterol in her brother; Hypertension in her brother, father, and mother; Skin cancer in her brother.   ROS:   Please see the history of present illness.    ROS All other systems reviewed and are negative.   PHYSICAL EXAM:   VS:  BP 100/70 (BP Location: Left Arm, Patient Position: Sitting, Cuff Size: Normal)   Pulse 88   Ht 5\' 5"  (1.651 m)   Wt 127 lb 12.8 oz (58 kg)   SpO2 98%   BMI 21.27 kg/m    GEN: thin anxious female in no acute distress  HEENT: normal  Neck: no JVD, carotid bruits, or masses Cardiac: RRR; no murmurs, rubs, or gallops,no edema  Her upper right chest scar is tender. Has right rib tenderness.   Respiratory:  clear to auscultation bilaterally, normal work of breathing GI: soft, nontender, nondistended, + BS MS: no deformity or atrophy  Skin: warm and dry, no rash Neuro:  Alert and Oriented x 3, Strength and sensation are intact Psych: euthymic mood, full affect  Wt Readings from Last 3 Encounters:  06/25/16 127 lb 12.8 oz (58 kg)  04/18/16  124 lb 9 oz (56.5 kg)  04/09/16 123 lb (55.8 kg)      Studies/Labs Reviewed:   EKG:  EKG is ordered today.  The ekg ordered today demonstrates sinus tachycardia at rate of 102 bpm.   Recent Labs: 01/30/2016: ALT 17 02/02/2016: Magnesium 2.2 02/04/2016: Hemoglobin 9.5; Platelets 112 02/05/2016: BUN 9; Creatinine, Ser 0.89; Potassium 4.4; Sodium 141   Lipid Panel No results found for: CHOL, TRIG, HDL, CHOLHDL, VLDL, LDLCALC, LDLDIRECT  Additional studies/ records that were reviewed today include:   As above  ASSESSMENT & PLAN:   1. Sinus tachycardia Much better on the slightly higher dose of metoprolol .   2. Mitral valve prolapse with severe MR s/p minimally invasive mitral valve repair and PFO closure: doing well.       Signed, Mertie Moores, MD  06/25/2016 11:44 AM    [facility] [place], [place], [place]  [address] Phone: [phone]; Fax: [phone]

## 2016-06-25 NOTE — Patient Instructions (Signed)
Medication Instructions:  Your physician recommends that you continue on your current medications as directed. Please refer to the Current Medication list given to you today.   Labwork: None Ordered   Testing/Procedures: None Ordered   Follow-Up: Your physician wants you to follow-up in: 6 months with Dr. [name].  You will receive a reminder letter in the mail two months in advance. If you don't receive a letter, please call our office to schedule the follow-up appointment.   If you need a refill on your cardiac medications before your next appointment, please call your pharmacy.   Thank you for choosing [facility]! Dema Timmons, RN [phone]    

## 2016-06-27 ENCOUNTER — Encounter (HOSPITAL_COMMUNITY): Payer: Medicare Other

## 2016-06-27 ENCOUNTER — Encounter (HOSPITAL_COMMUNITY)
Admission: RE | Admit: 2016-06-27 | Discharge: 2016-06-27 | Disposition: A | Discharge status: Home / Self Care | Payer: Medicare Other | Source: Ambulatory Visit | Attending: Cardiovascular Disease | Admitting: Cardiovascular Disease

## 2016-06-27 DIAGNOSIS — Z9889 Other specified postprocedural states: Secondary | ICD-10-CM | POA: Diagnosis not present

## 2016-06-29 ENCOUNTER — Encounter (HOSPITAL_COMMUNITY): Payer: Medicare Other

## 2016-06-29 ENCOUNTER — Encounter (HOSPITAL_COMMUNITY)
Admission: RE | Admit: 2016-06-29 | Discharge: 2016-06-29 | Disposition: A | Discharge status: Home / Self Care | Payer: Medicare Other | Source: Ambulatory Visit | Attending: Cardiovascular Disease | Admitting: Cardiovascular Disease

## 2016-06-29 DIAGNOSIS — Z9889 Other specified postprocedural states: Secondary | ICD-10-CM | POA: Diagnosis not present

## 2016-07-02 ENCOUNTER — Encounter (HOSPITAL_COMMUNITY): Payer: Medicare Other

## 2016-07-02 ENCOUNTER — Encounter (HOSPITAL_COMMUNITY)
Admission: RE | Admit: 2016-07-02 | Discharge: 2016-07-02 | Disposition: A | Discharge status: Home / Self Care | Payer: Medicare Other | Source: Ambulatory Visit | Attending: Cardiovascular Disease | Admitting: Cardiovascular Disease

## 2016-07-02 DIAGNOSIS — Z9889 Other specified postprocedural states: Secondary | ICD-10-CM

## 2016-07-04 ENCOUNTER — Encounter (HOSPITAL_COMMUNITY): Payer: Medicare Other

## 2016-07-06 ENCOUNTER — Encounter (HOSPITAL_COMMUNITY): Payer: Medicare Other

## 2016-07-09 ENCOUNTER — Encounter (HOSPITAL_COMMUNITY)
Admission: RE | Admit: 2016-07-09 | Discharge: 2016-07-09 | Disposition: A | Discharge status: Home / Self Care | Payer: Medicare Other | Source: Ambulatory Visit | Attending: Cardiovascular Disease | Admitting: Cardiovascular Disease

## 2016-07-09 ENCOUNTER — Encounter (HOSPITAL_COMMUNITY): Payer: Medicare Other

## 2016-07-09 DIAGNOSIS — Z9889 Other specified postprocedural states: Secondary | ICD-10-CM

## 2016-07-11 ENCOUNTER — Encounter (HOSPITAL_COMMUNITY)
Admission: RE | Admit: 2016-07-11 | Discharge: 2016-07-11 | Disposition: A | Discharge status: Home / Self Care | Payer: Medicare Other | Source: Ambulatory Visit | Attending: Cardiovascular Disease | Admitting: Cardiovascular Disease

## 2016-07-11 ENCOUNTER — Encounter (HOSPITAL_COMMUNITY): Payer: Medicare Other

## 2016-07-11 DIAGNOSIS — Z9889 Other specified postprocedural states: Secondary | ICD-10-CM | POA: Diagnosis not present

## 2016-07-13 ENCOUNTER — Encounter (HOSPITAL_COMMUNITY)
Admission: RE | Admit: 2016-07-13 | Discharge: 2016-07-13 | Disposition: A | Discharge status: Home / Self Care | Payer: Medicare Other | Source: Ambulatory Visit | Attending: Cardiovascular Disease | Admitting: Cardiovascular Disease

## 2016-07-13 ENCOUNTER — Encounter (HOSPITAL_COMMUNITY): Payer: Medicare Other

## 2016-07-13 DIAGNOSIS — Z9889 Other specified postprocedural states: Secondary | ICD-10-CM | POA: Diagnosis not present

## 2016-07-16 ENCOUNTER — Encounter (HOSPITAL_COMMUNITY)
Admission: RE | Admit: 2016-07-16 | Discharge: 2016-07-16 | Disposition: A | Discharge status: Home / Self Care | Payer: Medicare Other | Source: Ambulatory Visit | Attending: Cardiovascular Disease | Admitting: Cardiovascular Disease

## 2016-07-16 ENCOUNTER — Encounter (HOSPITAL_COMMUNITY): Payer: Medicare Other

## 2016-07-16 DIAGNOSIS — Z9889 Other specified postprocedural states: Secondary | ICD-10-CM | POA: Diagnosis not present

## 2016-07-16 NOTE — Progress Notes (Signed)
Cardiac Individual Treatment Plan  Patient Details  Name: Destiny Andersen MRN: [id number] Date of Birth: [birth year] Referring Provider:   Flowsheet Row CARDIAC REHAB PHASE II ORIENTATION from 04/17/2016 in [place]  Referring Provider  [name], Arnette Norris, MD      Initial Encounter Date:  Flowsheet Row CARDIAC REHAB PHASE II ORIENTATION from 04/17/2016 in [place]  Date  04/17/16  Referring Provider  [name], Arnette Norris, MD      Visit Diagnosis: S/P mitral valve repair  Patient's Home Medications on Admission:  Current Outpatient Prescriptions:  .  aspirin 81 MG tablet, Take 81 mg by mouth daily., Disp: , Rfl:  .  calcium citrate-vitamin D (CITRACAL+D) 315-200 MG-UNIT tablet, Take 1 tablet by mouth 3 (three) times daily. VITAMIN D DOSAGE IS 250MG INSTEAD OF 200, Disp: , Rfl:  .  clindamycin (CLEOCIN) 300 MG capsule, Take 2 capsules (600 mg total) by mouth as directed. Take 1 hour prior to dental work, Disp: 2 capsule, Rfl: 3 .  metoprolol tartrate (LOPRESSOR) 25 MG tablet, Take 1 tablet (25 mg total) by mouth 2 (two) times daily. Take 1/2 tab (12.5 mg) in the morning and 25 mg in the evening, Disp: 135 tablet, Rfl: 3  Past Medical History: Past Medical History:  Diagnosis Date  . Cancer [facility]) 1983   Cervical cancer.  Skin Cancer- leg  . Chronic headaches    "? Migraines"- does not have then any more  . Constipation   . Elevated C-reactive protein (CRP)   . Family history of breast cancer    Mother  . Fatigue   . Heart murmur    ECHO 02/10/13 EF = 60-65%, moderate posterior mitral valve prolapse w/possible flail segment, severe MR, mitral regurgitant jet is anteriorly directed.  Marland Kitchen History of blood transfusion    pre-eclampsia   . Hyperpotassemia    Migrated  . Medicare annual wellness visit, subsequent   . Mitral valve prolapse   . MR (mitral regurgitation)    ECHO 02/10/13 EF = 60-65%, moderate posterior mitral  valve prolapse w/possible flail segment, severe MR, mitral regurgitant jet is anteriorly directed.  . Risk for falls   . Routine general medical examination at a health care facility   . S/P minimally invasive mitral valve repair 02/01/2016   Complex valvuloplasty including triangular resection of flail posterior leaflet, artificial Gore-tex neochord placement x10 and 28 mm Sorin Memo 3D ring annuloplasty via right mini thoracotomy approach  . Seizures ([place])    with pre- echa  . Shortness of breath dyspnea    with exertion    Tobacco Use: History  Smoking Status  . Former Smoker  . Years: 25.00  . Types: Cigarettes  . Quit date: 06/18/1985  Smokeless Tobacco  . Never Used    Labs: Recent Review Flowsheet Data    Labs for ITP Cardiac and Pulmonary Rehab Latest Ref Rng & Units 02/01/2016 02/02/2016 02/02/2016 02/02/2016 02/02/2016   Hemoglobin A1c 4.8 - 5.6 % - - - - -   PHART 7.350 - 7.450 - 7.388 7.428 7.350 -   PCO2ART 35.0 - 45.0 mmHg - 40.9 40.5 40.7 -   HCO3 20.0 - 24.0 mEq/L - 24.8(H) 26.8(H) 22.5 -   TCO2 0 - 100 mmol/L _0 ACIDBASEDEF 0.0 - 2.0 mmol/L - - - 3.0(H) -   O2SAT % - 98.0 98.0 97.0 -      Capillary Blood Glucose: Lab Results  Component Value Date   GLUCAP 96 02/03/2016   GLUCAP 85 02/02/2016   GLUCAP 121 (H) 02/02/2016   GLUCAP 136 (H) 02/02/2016   GLUCAP 155 (H) 02/02/2016     Exercise Target Goals:    Exercise Program Goal: Individual exercise prescription set with THRR, safety & activity barriers. Participant demonstrates ability to understand and report RPE using BORG scale, to self-measure pulse accurately, and to acknowledge the importance of the exercise prescription.  Exercise Prescription Goal: Starting with aerobic activity 30 plus minutes a day, 3 days per week for initial exercise prescription. Provide home exercise prescription and guidelines that participant acknowledges understanding prior to discharge.  Activity Barriers &  Risk Stratification:     Activity Barriers & Cardiac Risk Stratification - 04/17/16 1441      Activity Barriers & Cardiac Risk Stratification   Activity Barriers None   Cardiac Risk Stratification Moderate      6 Minute Walk:     6 Minute Walk    Row Name 04/18/16 1636         6 Minute Walk   Phase Initial     Distance 1756 feet     Walk Time 6 minutes     # of Rest Breaks 0     MPH 3.33     METS 3.98     RPE 9     VO2 Peak 13.93     Symptoms No     Resting HR 97 bpm     Resting BP 109/73     Max Ex. HR 118 bpm     Max Ex. BP 112/70     2 Minute Post BP 102/70        Initial Exercise Prescription:     Initial Exercise Prescription - 04/18/16 1600      Date of Initial Exercise RX and Referring Provider   Date 04/17/16   Referring Provider [name], Arnette Norris, MD     Treadmill   MPH 2.8   Grade 1   Minutes 10   METs 3.53     Bike   Level 0.9   Minutes 10   METs 4     NuStep   Level 2   Minutes 10   METs 3     Prescription Details   Frequency (times per week) 3   Duration Progress to 30 minutes of continuous aerobic without signs/symptoms of physical distress     Intensity   THRR 40-80% of Max Heartrate 60-119   Ratings of Perceived Exertion 11-13   Perceived Dyspnea 0-4     Progression   Progression Continue to progress workloads to maintain intensity without signs/symptoms of physical distress.     Resistance Training   Training Prescription Yes   Weight 2lbs.   Reps 10-12      Perform Capillary Blood Glucose checks as needed.  Exercise Prescription Changes:      Exercise Prescription Changes    Row Name 04/23/16 1200 05/23/16 1100 06/20/16 0800 07/16/16 1400       Exercise Review   Progression  -  - Yes Yes      Response to Exercise   Blood Pressure (Admit) 96/72 102/58 104/70 106/57    Blood Pressure (Exercise) 108/70 120/70 104/70 104/70    Blood Pressure (Exit) 104/60 92/64 103/69 104/70    Heart Rate (Admit) 102 bpm 93  bpm 83 bpm 84 bpm    Heart Rate (Exercise) 128 bpm 113 bpm 111 bpm 116 bpm  Heart Rate (Exit) 100 bpm 92 bpm 91 bpm 81 bpm    Rating of Perceived Exertion (Exercise)  - _0 Duration  - Progress to 45 minutes of aerobic exercise without signs/symptoms of physical distress Progress to 45 minutes of aerobic exercise without signs/symptoms of physical distress Progress to 45 minutes of aerobic exercise without signs/symptoms of physical distress    Intensity THRR unchanged  - THRR unchanged THRR unchanged      Progression   Progression Continue to progress workloads to maintain intensity without signs/symptoms of physical distress.  - Continue to progress workloads to maintain intensity without signs/symptoms of physical distress. Continue to progress workloads to maintain intensity without signs/symptoms of physical distress.    Average METs 3.31 3.5 3.8 4.3      Resistance Training   Training Prescription Yes Yes Yes Yes    Weight 2lbs. 2lbs. 3lbs 4lb    Reps 10-12 10-12 10-12 10-12      Treadmill   MPH 2.8 3.4 3.5 3.5    Grade _1 Minutes 10 10  -  -    METs 3.53 4.54 4.2 4.9      Bike   Level 0.9 1 2.5 2.5    Minutes _2 METs 4 2.6 3.2 4      NuStep   Level _3 Minutes _4 METs 2.4 3.3 3.7 4      Home Exercise Plan   Plans to continue exercise at  -  - Home Home    Frequency  -  - Add 3 additional days to program exercise sessions. Add 3 additional days to program exercise sessions.       Exercise Comments:      Exercise Comments    Row Name 04/23/16 1211 05/23/16 1114 06/20/16 1122 07/16/16 1406     Exercise Comments Pt is off to a great start with exercise  Reviewed METs and goals with pt.  Continues to do well with exercise Reviewed METs and goals with pt.  Continues to do well with exercise Reviewed METs and goals with pt.  Continues to do well with exercise       Discharge Exercise Prescription (Final Exercise  Prescription Changes):     Exercise Prescription Changes - 07/16/16 1400      Exercise Review   Progression Yes     Response to Exercise   Blood Pressure (Admit) 106/57   Blood Pressure (Exercise) 104/70   Blood Pressure (Exit) 104/70   Heart Rate (Admit) 84 bpm   Heart Rate (Exercise) 116 bpm   Heart Rate (Exit) 81 bpm   Rating of Perceived Exertion (Exercise) 14   Duration Progress to 45 minutes of aerobic exercise without signs/symptoms of physical distress   Intensity THRR unchanged     Progression   Progression Continue to progress workloads to maintain intensity without signs/symptoms of physical distress.   Average METs 4.3     Resistance Training   Training Prescription Yes   Weight 4lb   Reps 10-12     Treadmill   MPH 3.5   Grade 2   METs 4.9     Bike   Level 2.5   Minutes 10   METs 4     NuStep   Level 5   Minutes 10   METs 4     Home Exercise  Plan   Plans to continue exercise at Home   Frequency Add 3 additional days to program exercise sessions.      Nutrition:  Target Goals: Understanding of nutrition guidelines, daily intake of sodium <1523m, cholesterol <2011m calories 30% from fat and 7% or less from saturated fats, daily to have 5 or more servings of fruits and vegetables.  Biometrics:     Pre Biometrics - 04/18/16 1645      Pre Biometrics   Height _0  (1.651 m)   Weight 124 lb 9 oz (56.5 kg)   Waist Circumference 27 inches   Hip Circumference 36.25 inches   Waist to Hip Ratio 0.74 %   BMI (Calculated) 20.8   Triceps Skinfold 24 mm   % Body Fat 21.2 %   Grip Strength 27.5 kg   Flexibility 17.5 in   Single Leg Stand 30 seconds       Nutrition Therapy Plan and Nutrition Goals:     Nutrition Therapy & Goals - 04/18/16 1000      Nutrition Therapy   Diet Therapeutic Lifestyle Changes     Personal Nutrition Goals   Personal Goal #1 Pt to maintain her wt while in [place] and counsel regarding individualized specific dietary modifications aiming towards targeted core components such as weight, hypertension, lipid management, diabetes, heart failure and other comorbidities.   Expected Outcomes Short Term Goal: Understand basic principles of dietary content, such as calories, fat, sodium, cholesterol and nutrients.;Long Term Goal: Adherence to prescribed nutrition plan.      Nutrition Discharge: Nutrition Scores:     Nutrition Assessments - 04/18/16 1000      MEDFICTS Scores   Pre Score 13      Nutrition Goals Re-Evaluation:   Psychosocial: Target Goals: Acknowledge presence or absence of depression, maximize coping skills, provide positive support system. Participant is able to verbalize types and ability to use techniques and skills needed for reducing stress and depression.  Initial Review & Psychosocial Screening:     Initial Psych Review & Screening - 04/19/16 15StephensonYes     Barriers   Psychosocial barriers to participate in program There are no identifiable barriers or psychosocial needs.     Screening Interventions   Interventions Encouraged to exercise      Quality of Life Scores:     Quality of Life - 04/17/16 1627      Quality of Life Scores   Health/Function Pre 25.38 %   Socioeconomic Pre 27.5 %   Psych/Spiritual Pre 20.57 %   Family Pre 23.63 %   GLOBAL Pre 24.45 %      PHQ-9: Recent Review Flowsheet Data    [facility] 04/23/2016   Decreased Interest 0 0   Down, Depressed, Hopeless 0 0   PHQ - 2 Score 0 0      Psychosocial Evaluation and Intervention:   Psychosocial Re-Evaluation:     Psychosocial Re-Evaluation    Row Name 04/25/16 1805 05/23/16 1108 06/19/16 1209 07/16/16 1158       Psychosocial Re-Evaluation   Interventions Encouraged to attend Cardiac Rehabilitation for the exercise Encouraged to attend Cardiac  Rehabilitation for the exercise Encouraged to attend Cardiac Rehabilitation for the exercise Encouraged to attend Cardiac Rehabilitation for the exercise    Continued Psychosocial Services Needed No No No No  Vocational Rehabilitation: Provide vocational rehab assistance to qualifying candidates.   Vocational Rehab Evaluation & Intervention:     Vocational Rehab - 04/19/16 1514      Initial Vocational Rehab Evaluation & Intervention   Assessment shows need for Vocational Rehabilitation No  Destiny Andersen is retired      Education: Education Goals: Education classes will be provided on a weekly basis, covering required topics. Participant will state understanding/return demonstration of topics presented.  Learning Barriers/Preferences:     Learning Barriers/Preferences - 04/18/16 1643      Learning Barriers/Preferences   Learning Barriers Sight   Learning Preferences Audio;Skilled Demonstration      Education Topics: Count Your Pulse:  -Group instruction provided by verbal instruction, demonstration, patient participation and written materials to support subject.  Instructors address importance of being able to find your pulse and how to count your pulse when at home without a heart monitor.  Patients get hands on experience counting their pulse with staff help and individually. Flowsheet Row CARDIAC REHAB PHASE II EXERCISE from 06/22/2016 in [place]  Date  06/22/16  Educator  Barnet Pall RN  Instruction Review Code  2- meets goals/outcomes      Heart Attack, Angina, and Risk Factor Modification:  -Group instruction provided by verbal instruction, video, and written materials to support subject.  Instructors address signs and symptoms of angina and heart attacks.    Also discuss risk factors for heart disease and how to make changes to improve heart health risk factors. Flowsheet Row CARDIAC REHAB PHASE II EXERCISE from 06/22/2016 in [place]  Date  05/25/16  Instruction Review Code  2- meets goals/outcomes      Functional Fitness:  -Group instruction provided by verbal instruction, demonstration, patient participation, and written materials to support subject.  Instructors address safety measures for doing things around the house.  Discuss how to get up and down off the floor, how to pick things up properly, how to safely get out of a chair without assistance, and balance training.   Meditation and Mindfulness:  -Group instruction provided by verbal instruction, patient participation, and written materials to support subject.  Instructor addresses importance of mindfulness and meditation practice to help reduce stress and improve awareness.  Instructor also leads participants through a meditation exercise.  Flowsheet Row CARDIAC REHAB PHASE II EXERCISE from 06/22/2016 in [place]  Date  06/13/16  Instruction Review Code  2- meets goals/outcomes      Stretching for Flexibility and Mobility:  -Group instruction provided by verbal instruction, patient participation, and written materials to support subject.  Instructors lead participants through series of stretches that are designed to increase flexibility thus improving mobility.  These stretches are additional exercise for major muscle groups that are typically performed during regular warm up and cool down. Flowsheet Row CARDIAC REHAB PHASE II EXERCISE from 06/22/2016 in [place]  Date  05/23/16  Educator  Seward Carol  Instruction Review Code  2- meets goals/outcomes      Hands Only CPR Anytime:  -Group instruction provided by verbal instruction, video, patient participation and written materials to support subject.  Instructors co-teach with AHA video for hands only CPR.  Participants get hands on experience with mannequins.   Nutrition I class: Heart Healthy Eating:   -Group instruction provided by PowerPoint slides, verbal discussion, and written materials to support subject matter. The instructor gives an explanation and  review of the Therapeutic Lifestyle Changes diet recommendations, which includes a discussion on lipid goals, dietary fat, sodium, fiber, plant stanol/sterol esters, sugar, and the components of a well-balanced, healthy diet. Flowsheet Row CARDIAC REHAB PHASE II EXERCISE from 06/22/2016 in [place]  Date  04/24/16  Educator  RD  Instruction Review Code  2- meets goals/outcomes      Nutrition II class: Lifestyle Skills:  -Group instruction provided by PowerPoint slides, verbal discussion, and written materials to support subject matter. The instructor gives an explanation and review of label reading, grocery shopping for heart health, heart healthy recipe modifications, and ways to make healthier choices when eating out. Flowsheet Row CARDIAC REHAB PHASE II EXERCISE from 06/22/2016 in [place]  Date  05/01/16  Educator  RD  Instruction Review Code  2- meets goals/outcomes      Diabetes Question & Answer:  -Group instruction provided by PowerPoint slides, verbal discussion, and written materials to support subject matter. The instructor gives an explanation and review of diabetes co-morbidities, pre- and post-prandial blood glucose goals, pre-exercise blood glucose goals, signs, symptoms, and treatment of hypoglycemia and hyperglycemia, and foot care basics.   Diabetes Blitz:  -Group instruction provided by PowerPoint slides, verbal discussion, and written materials to support subject matter. The instructor gives an explanation and review of the physiology behind type 1 and type 2 diabetes, diabetes medications and rational behind using different medications, pre- and post-prandial blood glucose recommendations and Hemoglobin A1c goals, diabetes diet, and exercise including  blood glucose guidelines for exercising safely.    Portion Distortion:  -Group instruction provided by PowerPoint slides, verbal discussion, written materials, and food models to support subject matter. The instructor gives an explanation of serving size versus portion size, changes in portions sizes over the last 20 years, and what consists of a serving from each food group. Flowsheet Row CARDIAC REHAB PHASE II EXERCISE from 06/22/2016 in [place]  Date  06/20/16  Educator  RD  Instruction Review Code  2- meets goals/outcomes      Stress Management:  -Group instruction provided by verbal instruction, video, and written materials to support subject matter.  Instructors review role of stress in heart disease and how to cope with stress positively.   Flowsheet Row CARDIAC REHAB PHASE II EXERCISE from 06/22/2016 in [place]  Date  05/02/16  Instruction Review Code  2- meets goals/outcomes      Exercising on Your Own:  -Group instruction provided by verbal instruction, power point, and written materials to support subject.  Instructors discuss benefits of exercise, components of exercise, frequency and intensity of exercise, and end points for exercise.  Also discuss use of nitroglycerin and activating EMS.  Review options of places to exercise outside of rehab.  Review guidelines for sex with heart disease. Flowsheet Row CARDIAC REHAB PHASE II EXERCISE from 06/22/2016 in [place]  Date  05/04/16  Instruction Review Code  2- meets goals/outcomes      Cardiac Drugs I:  -Group instruction provided by verbal instruction and written materials to support subject.  Instructor reviews cardiac drug classes: antiplatelets, anticoagulants, beta blockers, and statins.  Instructor discusses reasons, side effects, and lifestyle considerations for each drug class. Flowsheet Row CARDIAC REHAB PHASE II EXERCISE  from 06/22/2016 in [place]  Date  05/30/16  Educator  pharmacist  Instruction Review Code  2- meets goals/outcomes      Cardiac Drugs II:  -Group instruction provided by verbal instruction and written materials to support subject.  Instructor reviews cardiac drug classes: angiotensin converting enzyme inhibitors (ACE-I), angiotensin II receptor blockers (ARBs), nitrates, and calcium channel blockers.  Instructor discusses reasons, side effects, and lifestyle considerations for each drug class. Flowsheet Row CARDIAC REHAB PHASE II EXERCISE from 06/22/2016 in [place]  Date  04/25/16  Educator  Kennyth Lose [facility]  Instruction Review Code  2- meets goals/outcomes      Anatomy and Physiology of the Circulatory System:  -Group instruction provided by verbal instruction, video, and written materials to support subject.  Reviews functional anatomy of heart, how it relates to various diagnoses, and what role the heart plays in the overall system.   Knowledge Questionnaire Score:     Knowledge Questionnaire Score - 04/17/16 1626      Knowledge Questionnaire Score   Pre Score 22/24      Core Components/Risk Factors/Patient Goals at Admission:     Personal Goals and Risk Factors at Admission - 04/18/16 1632      Core Components/Risk Factors/Patient Goals on Admission   Hypertension Yes   Intervention Provide education on lifestyle modifcations including regular physical activity/exercise, weight management, moderate sodium restriction and increased consumption of fresh fruit, vegetables, and low fat dairy, alcohol moderation, and smoking cessation.;Monitor prescription use compliance.   Expected Outcomes Short Term: Continued assessment and intervention until BP is < 140/50m HG in hypertensive participants. < 130/87mHG in hypertensive participants with diabetes, heart failure or chronic kidney disease.;Long Term: Maintenance  of blood pressure at goal levels.   Personal Goal Other Yes   Personal Goal Return to previous state of health prior to surgery.   Intervention Develop an individualized exercise program including stretching, aerobic and resistance training program to help improve functional capacity and cardiorespiratory fitness, so that participant can resume previous activities that she was able to participate in prior to surgery.   Expected Outcomes Paticipant will be able resume previous activities of daily living that she was able to engage in prior to surgery.      Core Components/Risk Factors/Patient Goals Review:      Goals and Risk Factor Review    Row Name 04/23/16 1212 05/23/16 1114 06/20/16 1123 07/16/16 1406       Core Components/Risk Factors/Patient Goals Review   Personal Goals Review Increase Strength and Stamina;Other Increase Strength and Stamina;Other Increase Strength and Stamina;Other Increase Strength and Stamina;Other    Review Continue with individualized exercise program to help improve functional capacity and cardiorespiratory fitness Pt states she is feeling more independent since beginning an exercise program.  She is working with a trainer at the gym 2 days/week. Pt is working with trainer 3 days/wk and she's tolerating workload increases well.   [facility] [employer] is now working with her trainer once/week.  She is doing well in CRPII and is tolerating workload increases well     Expected Outcomes Be able to resume to previous ADLs Continue exercise program to improve with ADLs and cardiovascular fitness Continue exercise program to improve with ADLs and cardiovascular fitness Continue exercise program to improve with ADLs and cardiorespiratory fitness       Core Components/Risk Factors/Patient Goals at Discharge (Final Review):      Goals and Risk Factor Review - 07/16/16 1406      Core Components/Risk Factors/Patient Goals Review  Personal Goals  Review Increase Strength and Stamina;Other   Review Pt is exercising on her own at [employer] and is now working with her trainer once/week.  She is doing well in CRPII and is tolerating workload increases well    Expected Outcomes Continue exercise program to improve with ADLs and cardiorespiratory fitness      ITP Comments:     ITP Comments    Row Name 04/17/16 1330 06/15/16 1234         ITP Comments Medical Director- Dr. Fransico Him, MD attended CPR education class.  outcomes and goals met          Comments: Destiny Andersen is making expected progress toward personal goals after completing 33 sessions. Recommend continued exercise and life style modification education including  stress management and relaxation techniques to decrease cardiac risk profile. Destiny Andersen will be graduating from cardiac rehab next week. Barnet Pall, RN,BSN 07/18/2016 9:30 AM

## 2016-07-18 ENCOUNTER — Encounter (HOSPITAL_COMMUNITY)
Admission: RE | Admit: 2016-07-18 | Discharge: 2016-07-18 | Disposition: A | Discharge status: Home / Self Care | Payer: Medicare Other | Source: Ambulatory Visit | Attending: Cardiovascular Disease | Admitting: Cardiovascular Disease

## 2016-07-18 ENCOUNTER — Encounter (HOSPITAL_COMMUNITY): Payer: Medicare Other

## 2016-07-18 DIAGNOSIS — Z9889 Other specified postprocedural states: Secondary | ICD-10-CM | POA: Diagnosis not present

## 2016-07-19 ENCOUNTER — Ambulatory Visit
Admission: RE | Admit: 2016-07-19 | Discharge: 2016-07-19 | Disposition: A | Discharge status: Home / Self Care | Payer: Medicare Other | Source: Ambulatory Visit | Attending: Family Medicine | Admitting: Family Medicine

## 2016-07-19 DIAGNOSIS — Z1231 Encounter for screening mammogram for malignant neoplasm of breast: Secondary | ICD-10-CM

## 2016-07-20 ENCOUNTER — Encounter (HOSPITAL_COMMUNITY)
Admission: RE | Admit: 2016-07-20 | Discharge: 2016-07-20 | Disposition: A | Discharge status: Home / Self Care | Payer: Medicare Other | Source: Ambulatory Visit | Attending: Cardiovascular Disease | Admitting: Cardiovascular Disease

## 2016-07-20 ENCOUNTER — Encounter (HOSPITAL_COMMUNITY): Payer: Medicare Other

## 2016-07-20 DIAGNOSIS — I340 Nonrheumatic mitral (valve) insufficiency: Secondary | ICD-10-CM | POA: Diagnosis not present

## 2016-07-20 DIAGNOSIS — Z9889 Other specified postprocedural states: Secondary | ICD-10-CM | POA: Diagnosis present

## 2016-07-23 ENCOUNTER — Encounter (HOSPITAL_COMMUNITY)
Admission: RE | Admit: 2016-07-23 | Discharge: 2016-07-23 | Disposition: A | Discharge status: Home / Self Care | Payer: Medicare Other | Source: Ambulatory Visit | Attending: Cardiovascular Disease | Admitting: Cardiovascular Disease

## 2016-07-23 ENCOUNTER — Encounter (HOSPITAL_COMMUNITY): Payer: Medicare Other

## 2016-07-23 VITALS — Wt 126.8 lb

## 2016-07-23 DIAGNOSIS — Z9889 Other specified postprocedural states: Secondary | ICD-10-CM

## 2016-07-23 NOTE — Progress Notes (Signed)
Discharge Summary  Patient Details  Name: Destiny Andersen MRN: [id number] Date of Birth: 11/27/1945 Referring Provider:   Flowsheet Row CARDIAC REHAB PHASE II ORIENTATION from 04/17/2016 in [facility]  Referring Provider  [name], Arnette Norris, MD       Number of Visits: 36  Reason for Discharge:  Patient independent in their exercise.  Smoking History:  History  Smoking Status  . Former Smoker  . Years: 25.00  . Types: Cigarettes  . Quit date: 06/18/1985  Smokeless Tobacco  . Never Used    Diagnosis:  S/P mitral valve repair  ADL UCSD:   Initial Exercise Prescription:     Initial Exercise Prescription - 04/18/16 1600      Date of Initial Exercise RX and Referring Provider   Date 04/17/16   Referring Provider [name], Arnette Norris, MD     Treadmill   MPH 2.8   Grade 1   Minutes 10   METs 3.53     Bike   Level 0.9   Minutes 10   METs 4     NuStep   Level 2   Minutes 10   METs 3     Prescription Details   Frequency (times per week) 3   Duration Progress to 30 minutes of continuous aerobic without signs/symptoms of physical distress     Intensity   THRR 40-80% of Max Heartrate 60-119   Ratings of Perceived Exertion 11-13   Perceived Dyspnea 0-4     Progression   Progression Continue to progress workloads to maintain intensity without signs/symptoms of physical distress.     Resistance Training   Training Prescription Yes   Weight 2lbs.   Reps 10-12      Discharge Exercise Prescription (Final Exercise Prescription Changes):     Exercise Prescription Changes - 07/23/16 1200      Exercise Review   Progression Yes     Response to Exercise   Blood Pressure (Admit) 104/60   Blood Pressure (Exercise) 100/70   Blood Pressure (Exit) 104/70   Heart Rate (Admit) 87 bpm   Heart Rate (Exercise) 104 bpm   Heart Rate (Exit) 82 bpm   Rating of Perceived Exertion (Exercise) 13   Duration Progress to 45 minutes of aerobic  exercise without signs/symptoms of physical distress   Intensity THRR unchanged     Progression   Progression Continue to progress workloads to maintain intensity without signs/symptoms of physical distress.   Average METs 4.65     Resistance Training   Training Prescription Yes   Weight 4lb   Reps 10-12     Treadmill   MPH 3.5   Grade 2   METs 4.9     Bike   Level 2.5   Minutes 10   METs 4     NuStep   Level 5   Minutes 10   METs 4     Home Exercise Plan   Plans to continue exercise at Home   Frequency Add 3 additional days to program exercise sessions.      Functional Capacity:     6 Minute Walk    Row Name 04/18/16 1636 07/23/16 1216       6 Minute Walk   Phase Initial Discharge    Distance 1756 feet 1856 feet    Distance % Change  - 5.7 %    Walk Time 6 minutes 6 minutes    # of Rest Breaks 0 0    MPH 3.33  3.5    METS 3.98 3.9    RPE 9 8    VO2 Peak 13.93 13.6    Symptoms No No    Resting HR 97 bpm 85 bpm    Resting BP 109/73 105/72    Max Ex. HR 118 bpm 103 bpm    Max Ex. BP 112/70 97/66    2 Minute Post BP 102/70 94/64       Psychological, QOL, Others - Outcomes: PHQ 2/9: Depression screen [facility] 2/9 07/23/2016 06/19/2016 04/23/2016  Decreased Interest 0 0 0  Down, Depressed, Hopeless 0 0 0  PHQ - 2 Score 0 0 0    Quality of Life:     Quality of Life - 07/23/16 1220      Quality of Life Scores   Health/Function Pre (P)  25.38 %   Health/Function Post (P)  26.63 %   Health/Function % Change (P)  4.93 %   Socioeconomic Pre (P)  27.5 %   Socioeconomic Post (P)  27.92 %   Socioeconomic % Change  (P)  1.53 %   Psych/Spiritual Pre (P)  20.57 %   Psych/Spiritual Post (P)  23.57 %   Psych/Spiritual % Change (P)  14.58 %   Family Pre (P)  23.63 %   Family Post (P)  25.5 %   Family % Change (P)  7.91 %   GLOBAL Pre (P)  24.45 %   GLOBAL Post (P)  26.02 %   GLOBAL % Change (P)  6.42 %      Personal Goals: Goals established at orientation  with interventions provided to work toward goal.     Personal Goals and Risk Factors at Admission - 04/18/16 1632      Core Components/Risk Factors/Patient Goals on Admission   Hypertension Yes   Intervention Provide education on lifestyle modifcations including regular physical activity/exercise, weight management, moderate sodium restriction and increased consumption of fresh fruit, vegetables, and low fat dairy, alcohol moderation, and smoking cessation.;Monitor prescription use compliance.   Expected Outcomes Short Term: Continued assessment and intervention until BP is < 140/73m HG in hypertensive participants. < 130/845mHG in hypertensive participants with diabetes, heart failure or chronic kidney disease.;Long Term: Maintenance of blood pressure at goal levels.   Personal Goal Other Yes   Personal Goal Return to previous state of health prior to surgery.   Intervention Develop an individualized exercise program including stretching, aerobic and resistance training program to help improve functional capacity and cardiorespiratory fitness, so that participant can resume previous activities that she was able to participate in prior to surgery.   Expected Outcomes Paticipant will be able resume previous activities of daily living that she was able to engage in prior to surgery.       Personal Goals Discharge:     Goals and Risk Factor Review    Row Name 04/23/16 1212 05/23/16 1114 06/20/16 1123 07/16/16 1406       Core Components/Risk Factors/Patient Goals Review   Personal Goals Review Increase Strength and Stamina;Other Increase Strength and Stamina;Other Increase Strength and Stamina;Other Increase Strength and Stamina;Other    Review Continue with individualized exercise program to help improve functional capacity and cardiorespiratory fitness Pt states she is feeling more independent since beginning an exercise program.  She is working with a trainer at the gym 2 days/week. Pt is  working with trainer 3 days/wk and she's tolerating workload increases well.   Pt is exercising on her own [employer] is  now working with her trainer once/week.  She is doing well in CRPII and is tolerating workload increases well     Expected Outcomes Be able to resume to previous ADLs Continue exercise program to improve with ADLs and cardiovascular fitness Continue exercise program to improve with ADLs and cardiovascular fitness Continue exercise program to improve with ADLs and cardiorespiratory fitness       Nutrition & Weight - Outcomes:     Pre Biometrics - 04/18/16 1645      Pre Biometrics   Height _0  (1.651 m)   Weight 124 lb 9 oz (56.5 kg)   Waist Circumference 27 inches   Hip Circumference 36.25 inches   Waist to Hip Ratio 0.74 %   BMI (Calculated) 20.8   Triceps Skinfold 24 mm   % Body Fat 21.2 %   Grip Strength 27.5 kg   Flexibility 17.5 in   Single Leg Stand 30 seconds         Post Biometrics - 07/23/16 1219       Post  Biometrics   Weight 126 lb 12.2 oz (57.5 kg)   Waist Circumference 29.5 inches   Hip Circumference 37.25 inches   Waist to Hip Ratio 0.79 %   Triceps Skinfold 24 mm   % Body Fat 33 %   Grip Strength 32 kg   Flexibility 18.5 in   Single Leg Stand 30 seconds      Nutrition:     Nutrition Therapy & Goals - 04/18/16 1000      Nutrition Therapy   Diet Therapeutic Lifestyle Changes     Personal Nutrition Goals   Personal Goal #1 Pt to maintain her wt while in [place], educate and counsel regarding individualized specific dietary modifications aiming towards targeted core components such as weight, hypertension, lipid management, diabetes, heart failure and other comorbidities.   Expected Outcomes Short Term Goal: Understand basic principles of dietary content, such as calories, fat, sodium, cholesterol and nutrients.;Long Term Goal: Adherence to prescribed nutrition plan.       Nutrition Discharge:     Nutrition Assessments - 07/23/16 1426      MEDFICTS Scores   Pre Score 13   Post Score 10   Score Difference -3      Education Questionnaire Score:     Knowledge Questionnaire Score - 07/23/16 1215      Knowledge Questionnaire Score   Post Score 22/24      Goals reviewed with patient; copy given to patient. Astraea graduated from cardiac rehab program today with completion of 36 exercise sessions in Phase II. Pt maintained good attendance and progressed nicely during his participation in rehab as evidenced by increased MET level.   Medication list reconciled. Repeat  PHQ score- 0 .  Pt has made significant lifestyle changes and should be commended for his success. Gaelle feels she has achieved her goals during cardiac rehab.   Anivea plans to continue exercise in cardiac maintenance program. We are very proud of Shanon's progress. Colena may be interested in Volunteering in the future.Barnet Pall, RN,BSN 07/26/2016 10:37 AM

## 2016-07-25 ENCOUNTER — Encounter (HOSPITAL_COMMUNITY)
Admission: RE | Admit: 2016-07-25 | Discharge: 2016-07-25 | Disposition: A | Discharge status: Home / Self Care | Payer: Self-pay | Source: Ambulatory Visit | Attending: Cardiovascular Disease | Admitting: Cardiovascular Disease

## 2016-07-25 ENCOUNTER — Encounter (HOSPITAL_COMMUNITY): Payer: Medicare Other

## 2016-07-25 DIAGNOSIS — Z9889 Other specified postprocedural states: Secondary | ICD-10-CM | POA: Insufficient documentation

## 2016-07-25 DIAGNOSIS — I340 Nonrheumatic mitral (valve) insufficiency: Secondary | ICD-10-CM | POA: Insufficient documentation

## 2016-07-27 ENCOUNTER — Encounter (HOSPITAL_COMMUNITY): Payer: Medicare Other

## 2016-07-27 ENCOUNTER — Encounter (HOSPITAL_COMMUNITY)
Admission: RE | Admit: 2016-07-27 | Discharge: 2016-07-27 | Disposition: A | Discharge status: Home / Self Care | Payer: Self-pay | Source: Ambulatory Visit | Attending: Cardiovascular Disease | Admitting: Cardiovascular Disease

## 2016-07-30 ENCOUNTER — Encounter (HOSPITAL_COMMUNITY): Payer: Medicare Other

## 2016-07-30 ENCOUNTER — Encounter (HOSPITAL_COMMUNITY)
Admission: RE | Admit: 2016-07-30 | Discharge: 2016-07-30 | Disposition: A | Discharge status: Home / Self Care | Payer: Self-pay | Source: Ambulatory Visit | Attending: Cardiovascular Disease | Admitting: Cardiovascular Disease

## 2016-08-01 ENCOUNTER — Encounter (HOSPITAL_COMMUNITY): Payer: Medicare Other

## 2016-08-01 ENCOUNTER — Encounter (HOSPITAL_COMMUNITY)
Admission: RE | Admit: 2016-08-01 | Discharge: 2016-08-01 | Disposition: A | Discharge status: Home / Self Care | Payer: Self-pay | Source: Ambulatory Visit | Attending: Cardiovascular Disease | Admitting: Cardiovascular Disease

## 2016-08-03 ENCOUNTER — Encounter (HOSPITAL_COMMUNITY): Payer: Medicare Other

## 2016-08-03 ENCOUNTER — Encounter (HOSPITAL_COMMUNITY)
Admission: RE | Admit: 2016-08-03 | Discharge: 2016-08-03 | Disposition: A | Discharge status: Home / Self Care | Payer: Self-pay | Source: Ambulatory Visit | Attending: Cardiovascular Disease | Admitting: Cardiovascular Disease

## 2016-08-06 ENCOUNTER — Encounter (HOSPITAL_COMMUNITY): Payer: Medicare Other

## 2016-08-06 ENCOUNTER — Encounter (HOSPITAL_COMMUNITY)
Admission: RE | Admit: 2016-08-06 | Discharge: 2016-08-06 | Disposition: A | Discharge status: Home / Self Care | Payer: Self-pay | Source: Ambulatory Visit | Attending: Cardiovascular Disease | Admitting: Cardiovascular Disease

## 2016-08-08 ENCOUNTER — Encounter (HOSPITAL_COMMUNITY): Payer: Medicare Other

## 2016-08-08 ENCOUNTER — Encounter (HOSPITAL_COMMUNITY)
Admission: RE | Admit: 2016-08-08 | Discharge: 2016-08-08 | Disposition: A | Discharge status: Home / Self Care | Payer: Self-pay | Source: Ambulatory Visit | Attending: Cardiovascular Disease | Admitting: Cardiovascular Disease

## 2016-08-10 ENCOUNTER — Encounter (HOSPITAL_COMMUNITY): Payer: Medicare Other

## 2016-08-10 ENCOUNTER — Encounter (HOSPITAL_COMMUNITY)
Admission: RE | Admit: 2016-08-10 | Discharge: 2016-08-10 | Disposition: A | Discharge status: Home / Self Care | Payer: Self-pay | Source: Ambulatory Visit | Attending: Cardiovascular Disease | Admitting: Cardiovascular Disease

## 2016-08-13 ENCOUNTER — Encounter (HOSPITAL_COMMUNITY): Payer: Medicare Other

## 2016-08-13 ENCOUNTER — Encounter (HOSPITAL_COMMUNITY)
Admission: RE | Admit: 2016-08-13 | Discharge: 2016-08-13 | Disposition: A | Discharge status: Home / Self Care | Payer: Self-pay | Source: Ambulatory Visit | Attending: Cardiovascular Disease | Admitting: Cardiovascular Disease

## 2016-08-15 ENCOUNTER — Encounter (HOSPITAL_COMMUNITY): Payer: Medicare Other

## 2016-08-15 ENCOUNTER — Encounter (HOSPITAL_COMMUNITY)
Admission: RE | Admit: 2016-08-15 | Discharge: 2016-08-15 | Disposition: A | Discharge status: Home / Self Care | Payer: Self-pay | Source: Ambulatory Visit | Attending: Cardiovascular Disease | Admitting: Cardiovascular Disease

## 2016-08-17 ENCOUNTER — Encounter (HOSPITAL_COMMUNITY)
Admission: RE | Admit: 2016-08-17 | Discharge: 2016-08-17 | Disposition: A | Discharge status: Home / Self Care | Payer: Self-pay | Source: Ambulatory Visit | Attending: Cardiovascular Disease | Admitting: Cardiovascular Disease

## 2016-08-17 ENCOUNTER — Encounter (HOSPITAL_COMMUNITY): Payer: Medicare Other

## 2016-08-17 DIAGNOSIS — Z9889 Other specified postprocedural states: Secondary | ICD-10-CM | POA: Insufficient documentation

## 2016-08-17 DIAGNOSIS — I340 Nonrheumatic mitral (valve) insufficiency: Secondary | ICD-10-CM | POA: Insufficient documentation

## 2016-08-20 ENCOUNTER — Encounter (HOSPITAL_COMMUNITY): Payer: Medicare Other

## 2016-08-20 ENCOUNTER — Encounter (HOSPITAL_COMMUNITY)
Admission: RE | Admit: 2016-08-20 | Discharge: 2016-08-20 | Disposition: A | Discharge status: Home / Self Care | Payer: Self-pay | Source: Ambulatory Visit | Attending: Cardiovascular Disease | Admitting: Cardiovascular Disease

## 2016-08-22 ENCOUNTER — Encounter (HOSPITAL_COMMUNITY)
Admission: RE | Admit: 2016-08-22 | Discharge: 2016-08-22 | Disposition: A | Discharge status: Home / Self Care | Payer: Self-pay | Source: Ambulatory Visit | Attending: Cardiovascular Disease | Admitting: Cardiovascular Disease

## 2016-08-24 ENCOUNTER — Encounter (HOSPITAL_COMMUNITY)
Admission: RE | Admit: 2016-08-24 | Discharge: 2016-08-24 | Disposition: A | Discharge status: Home / Self Care | Payer: Self-pay | Source: Ambulatory Visit | Attending: Cardiovascular Disease | Admitting: Cardiovascular Disease

## 2016-08-27 ENCOUNTER — Encounter (HOSPITAL_COMMUNITY)
Admission: RE | Admit: 2016-08-27 | Discharge: 2016-08-27 | Disposition: A | Discharge status: Home / Self Care | Payer: Self-pay | Source: Ambulatory Visit | Attending: Cardiovascular Disease | Admitting: Cardiovascular Disease

## 2016-08-29 ENCOUNTER — Encounter (HOSPITAL_COMMUNITY)
Admission: RE | Admit: 2016-08-29 | Discharge: 2016-08-29 | Disposition: A | Discharge status: Home / Self Care | Payer: Self-pay | Source: Ambulatory Visit | Attending: Cardiovascular Disease | Admitting: Cardiovascular Disease

## 2016-08-31 ENCOUNTER — Encounter (HOSPITAL_COMMUNITY)
Admission: RE | Admit: 2016-08-31 | Discharge: 2016-08-31 | Disposition: A | Discharge status: Home / Self Care | Payer: Self-pay | Source: Ambulatory Visit | Attending: Cardiovascular Disease | Admitting: Cardiovascular Disease

## 2016-09-03 ENCOUNTER — Encounter (HOSPITAL_COMMUNITY)
Admission: RE | Admit: 2016-09-03 | Discharge: 2016-09-03 | Disposition: A | Discharge status: Home / Self Care | Payer: Self-pay | Source: Ambulatory Visit | Attending: Cardiovascular Disease | Admitting: Cardiovascular Disease

## 2016-09-05 ENCOUNTER — Encounter (HOSPITAL_COMMUNITY)
Admission: RE | Admit: 2016-09-05 | Discharge: 2016-09-05 | Disposition: A | Discharge status: Home / Self Care | Payer: Self-pay | Source: Ambulatory Visit | Attending: Cardiovascular Disease | Admitting: Cardiovascular Disease

## 2016-09-07 ENCOUNTER — Encounter (HOSPITAL_COMMUNITY)
Admission: RE | Admit: 2016-09-07 | Discharge: 2016-09-07 | Disposition: A | Discharge status: Home / Self Care | Payer: Self-pay | Source: Ambulatory Visit | Attending: Cardiovascular Disease | Admitting: Cardiovascular Disease

## 2016-09-10 ENCOUNTER — Encounter (HOSPITAL_COMMUNITY)
Admission: RE | Admit: 2016-09-10 | Discharge: 2016-09-10 | Disposition: A | Discharge status: Home / Self Care | Payer: Self-pay | Source: Ambulatory Visit | Attending: Cardiovascular Disease | Admitting: Cardiovascular Disease

## 2016-09-12 ENCOUNTER — Encounter (HOSPITAL_COMMUNITY)
Admission: RE | Admit: 2016-09-12 | Discharge: 2016-09-12 | Disposition: A | Discharge status: Home / Self Care | Payer: Self-pay | Source: Ambulatory Visit | Attending: Cardiovascular Disease | Admitting: Cardiovascular Disease

## 2016-09-14 ENCOUNTER — Encounter (HOSPITAL_COMMUNITY)
Admission: RE | Admit: 2016-09-14 | Discharge: 2016-09-14 | Disposition: A | Discharge status: Home / Self Care | Payer: Self-pay | Source: Ambulatory Visit | Attending: Cardiovascular Disease | Admitting: Cardiovascular Disease

## 2016-09-17 ENCOUNTER — Encounter (HOSPITAL_COMMUNITY): Payer: Self-pay

## 2016-09-17 DIAGNOSIS — Z9889 Other specified postprocedural states: Secondary | ICD-10-CM | POA: Insufficient documentation

## 2016-09-17 DIAGNOSIS — I340 Nonrheumatic mitral (valve) insufficiency: Secondary | ICD-10-CM | POA: Insufficient documentation

## 2016-09-19 ENCOUNTER — Encounter (HOSPITAL_COMMUNITY)
Admission: RE | Admit: 2016-09-19 | Discharge: 2016-09-19 | Disposition: A | Discharge status: Home / Self Care | Payer: Self-pay | Source: Ambulatory Visit | Attending: Cardiovascular Disease | Admitting: Cardiovascular Disease

## 2016-09-21 ENCOUNTER — Encounter (HOSPITAL_COMMUNITY)
Admission: RE | Admit: 2016-09-21 | Discharge: 2016-09-21 | Disposition: A | Discharge status: Home / Self Care | Payer: Self-pay | Source: Ambulatory Visit | Attending: Cardiovascular Disease | Admitting: Cardiovascular Disease

## 2016-09-24 ENCOUNTER — Encounter (HOSPITAL_COMMUNITY)
Admission: RE | Admit: 2016-09-24 | Discharge: 2016-09-24 | Disposition: A | Discharge status: Home / Self Care | Payer: Self-pay | Source: Ambulatory Visit | Attending: Cardiovascular Disease | Admitting: Cardiovascular Disease

## 2016-09-26 ENCOUNTER — Encounter (HOSPITAL_COMMUNITY): Payer: Self-pay

## 2016-09-28 ENCOUNTER — Encounter (HOSPITAL_COMMUNITY): Payer: Self-pay

## 2016-10-01 ENCOUNTER — Encounter (HOSPITAL_COMMUNITY)
Admission: RE | Admit: 2016-10-01 | Discharge: 2016-10-01 | Disposition: A | Discharge status: Home / Self Care | Payer: Self-pay | Source: Ambulatory Visit | Attending: Cardiovascular Disease | Admitting: Cardiovascular Disease

## 2016-10-03 ENCOUNTER — Encounter (HOSPITAL_COMMUNITY)
Admission: RE | Admit: 2016-10-03 | Discharge: 2016-10-03 | Disposition: A | Discharge status: Home / Self Care | Payer: Self-pay | Source: Ambulatory Visit | Attending: Cardiovascular Disease | Admitting: Cardiovascular Disease

## 2016-10-05 ENCOUNTER — Encounter (HOSPITAL_COMMUNITY)
Admission: RE | Admit: 2016-10-05 | Discharge: 2016-10-05 | Disposition: A | Discharge status: Home / Self Care | Payer: Self-pay | Source: Ambulatory Visit | Attending: Cardiovascular Disease | Admitting: Cardiovascular Disease

## 2016-10-08 ENCOUNTER — Encounter (HOSPITAL_COMMUNITY)
Admission: RE | Admit: 2016-10-08 | Discharge: 2016-10-08 | Disposition: A | Discharge status: Home / Self Care | Payer: Self-pay | Source: Ambulatory Visit | Attending: Cardiovascular Disease | Admitting: Cardiovascular Disease

## 2016-10-10 ENCOUNTER — Encounter (HOSPITAL_COMMUNITY)
Admission: RE | Admit: 2016-10-10 | Discharge: 2016-10-10 | Disposition: A | Discharge status: Home / Self Care | Payer: Self-pay | Source: Ambulatory Visit | Attending: Cardiovascular Disease | Admitting: Cardiovascular Disease

## 2016-10-12 ENCOUNTER — Encounter (HOSPITAL_COMMUNITY): Payer: Self-pay

## 2016-10-15 ENCOUNTER — Encounter (HOSPITAL_COMMUNITY): Payer: Self-pay

## 2016-10-17 ENCOUNTER — Encounter (HOSPITAL_COMMUNITY): Payer: Self-pay

## 2016-10-19 ENCOUNTER — Encounter (HOSPITAL_COMMUNITY): Payer: Self-pay

## 2016-10-22 ENCOUNTER — Encounter (HOSPITAL_COMMUNITY): Payer: Self-pay

## 2016-10-24 ENCOUNTER — Encounter (HOSPITAL_COMMUNITY): Payer: Self-pay

## 2016-10-26 ENCOUNTER — Encounter (HOSPITAL_COMMUNITY): Payer: Self-pay

## 2016-10-29 ENCOUNTER — Encounter (HOSPITAL_COMMUNITY): Payer: Self-pay

## 2016-10-31 ENCOUNTER — Encounter (HOSPITAL_COMMUNITY): Payer: Self-pay

## 2016-11-02 ENCOUNTER — Encounter (HOSPITAL_COMMUNITY): Payer: Self-pay

## 2016-11-05 ENCOUNTER — Encounter (HOSPITAL_COMMUNITY): Payer: Self-pay

## 2016-11-07 ENCOUNTER — Encounter (HOSPITAL_COMMUNITY): Payer: Self-pay

## 2016-11-09 ENCOUNTER — Encounter (HOSPITAL_COMMUNITY): Payer: Self-pay

## 2016-11-14 ENCOUNTER — Encounter (HOSPITAL_COMMUNITY): Payer: Self-pay

## 2016-11-16 ENCOUNTER — Encounter (HOSPITAL_COMMUNITY): Payer: Self-pay

## 2016-11-19 ENCOUNTER — Encounter (HOSPITAL_COMMUNITY): Payer: Self-pay

## 2016-11-21 ENCOUNTER — Encounter (HOSPITAL_COMMUNITY): Payer: Self-pay

## 2016-11-23 ENCOUNTER — Encounter (HOSPITAL_COMMUNITY): Payer: Self-pay

## 2016-11-26 ENCOUNTER — Encounter (HOSPITAL_COMMUNITY): Payer: Self-pay

## 2016-11-28 ENCOUNTER — Encounter (HOSPITAL_COMMUNITY): Payer: Self-pay

## 2016-11-30 ENCOUNTER — Encounter (HOSPITAL_COMMUNITY): Payer: Self-pay

## 2016-12-03 ENCOUNTER — Encounter (HOSPITAL_COMMUNITY): Payer: Self-pay

## 2016-12-05 ENCOUNTER — Encounter (HOSPITAL_COMMUNITY): Payer: Self-pay

## 2016-12-07 ENCOUNTER — Encounter (HOSPITAL_COMMUNITY): Payer: Self-pay

## 2016-12-10 ENCOUNTER — Encounter (HOSPITAL_COMMUNITY): Payer: Self-pay

## 2016-12-12 ENCOUNTER — Encounter (HOSPITAL_COMMUNITY): Payer: Self-pay

## 2016-12-14 ENCOUNTER — Encounter (HOSPITAL_COMMUNITY): Payer: Self-pay

## 2016-12-17 ENCOUNTER — Encounter (HOSPITAL_COMMUNITY): Payer: Self-pay

## 2016-12-21 ENCOUNTER — Encounter (HOSPITAL_COMMUNITY): Payer: Self-pay

## 2016-12-24 ENCOUNTER — Encounter (HOSPITAL_COMMUNITY): Payer: Self-pay

## 2016-12-26 ENCOUNTER — Encounter (HOSPITAL_COMMUNITY): Payer: Self-pay

## 2016-12-28 ENCOUNTER — Encounter (HOSPITAL_COMMUNITY): Payer: Self-pay

## 2016-12-31 ENCOUNTER — Encounter (HOSPITAL_COMMUNITY): Payer: Self-pay

## 2017-01-02 ENCOUNTER — Encounter (HOSPITAL_COMMUNITY): Payer: Self-pay

## 2017-01-04 ENCOUNTER — Encounter (HOSPITAL_COMMUNITY): Payer: Self-pay

## 2017-01-07 ENCOUNTER — Encounter (HOSPITAL_COMMUNITY): Payer: Self-pay

## 2017-01-09 ENCOUNTER — Encounter (HOSPITAL_COMMUNITY): Payer: Self-pay

## 2017-01-11 ENCOUNTER — Encounter (HOSPITAL_COMMUNITY): Payer: Self-pay

## 2017-01-14 ENCOUNTER — Encounter (HOSPITAL_COMMUNITY): Payer: Self-pay

## 2017-02-04 ENCOUNTER — Other Ambulatory Visit: Payer: Self-pay | Admitting: *Deleted

## 2017-02-04 ENCOUNTER — Encounter: Payer: Self-pay | Admitting: Thoracic Surgery (Cardiothoracic Vascular Surgery)

## 2017-02-04 ENCOUNTER — Ambulatory Visit (INDEPENDENT_AMBULATORY_CARE_PROVIDER_SITE_OTHER): Payer: Medicare Other | Admitting: Thoracic Surgery (Cardiothoracic Vascular Surgery)

## 2017-02-04 VITALS — BP 104/68 | HR 71 | Resp 16 | Ht 65.0 in | Wt 124.4 lb

## 2017-02-04 DIAGNOSIS — I341 Nonrheumatic mitral (valve) prolapse: Secondary | ICD-10-CM

## 2017-02-04 DIAGNOSIS — Z9889 Other specified postprocedural states: Secondary | ICD-10-CM | POA: Diagnosis not present

## 2017-02-04 DIAGNOSIS — Z8774 Personal history of (corrected) congenital malformations of heart and circulatory system: Secondary | ICD-10-CM | POA: Diagnosis not present

## 2017-02-04 DIAGNOSIS — I340 Nonrheumatic mitral (valve) insufficiency: Secondary | ICD-10-CM | POA: Diagnosis not present

## 2017-02-04 DIAGNOSIS — I059 Rheumatic mitral valve disease, unspecified: Secondary | ICD-10-CM

## 2017-02-04 NOTE — Patient Instructions (Signed)

## 2017-02-04 NOTE — Progress Notes (Signed)
      [address]       ,[place] [address]             [phone]     CARDIOTHORACIC SURGERY OFFICE NOTE  Referring Provider is [name], Wonda Cheng, MD PCP is Darcus Austin, MD   HPI:  Patient is a 71 year old female who returns to the office today for routine follow-up approximately one year status post minimally invasive mitral valve repair on 02/01/2016. Her postoperative recovery was uneventful and she was last seen here in our office on 04/09/2016 at which time she was doing well.  Since then she has continued to do very well. She completed the outpatient cardiac rehabilitation program which she enjoyed considerably. She is back to normal physical activity without any limitations. She notes considerable improvement in comparison with how she felt during the last few weeks prior to her surgery last summer. She has not had a follow-up echocardiogram performed since her surgery. Overall she is feeling well and pleased with her outcome.   Current Outpatient Prescriptions  Medication Sig Dispense Refill  . aspirin 81 MG tablet Take 81 mg by mouth daily.    . calcium citrate-vitamin D (CITRACAL+D) 315-200 MG-UNIT tablet Take 1 tablet by mouth 3 (three) times daily. VITAMIN D DOSAGE IS 250MG  INSTEAD OF 200    . clindamycin (CLEOCIN) 300 MG capsule Take 2 capsules (600 mg total) by mouth as directed. Take 1 hour prior to dental work 2 capsule 3  . metoprolol tartrate (LOPRESSOR) 25 MG tablet Take 1 tablet (25 mg total) by mouth 2 (two) times daily. Take 1/2 tab (12.5 mg) in the morning and 25 mg in the evening 135 tablet 3   No current facility-administered medications for this visit.       Physical Exam:   BP 104/68 (BP Location: Right Arm, Patient Position: Sitting, Cuff Size: Normal)   Pulse 71   Resp 16   Ht 5\' 5"  (1.651 m)   Wt 124 lb 6.4 oz (56.4 kg)   SpO2 97% Comment: ON RA  BMI 20.70 kg/m   General:  Well-appearing  Chest:   Clear to  auscultation  CV:   Regular rate and rhythm without murmur  Incisions:  Completely healed  Abdomen:  Soft nontender  Extremities:  Warm and well-perfused  Diagnostic Tests:  n/a   Impression:  Patient is doing very well approximately one year status post minimally invasive mitral valve repair  Plan:  We have not recommended any changes to the patient's current medications. I have reminded the patient that she should undergo routine follow-up echocardiogram at some point in the near future, possibly at the time of her upcoming appointment with Dr. Acie Fredrickson.  The patient has been reminded regarding the importance of dental hygiene and the lifelong need for antibiotic prophylaxis for all dental cleanings and other related invasive procedures.  In the future she will call and return to see [place] only should specific problems or questions arise.  I spent in excess of 15 minutes during the conduct of this office consultation and >50% of this time involved direct face-to-face encounter with the patient for counseling and/or coordination of their care.   Valentina Gu. Roxy Manns, MD 02/04/2017 10:42 AM

## 2017-02-14 ENCOUNTER — Other Ambulatory Visit: Payer: Self-pay

## 2017-02-14 ENCOUNTER — Ambulatory Visit (HOSPITAL_COMMUNITY): Payer: Medicare Other | Attending: Cardiovascular Disease

## 2017-02-14 DIAGNOSIS — Z8249 Family history of ischemic heart disease and other diseases of the circulatory system: Secondary | ICD-10-CM | POA: Insufficient documentation

## 2017-02-14 DIAGNOSIS — I059 Rheumatic mitral valve disease, unspecified: Secondary | ICD-10-CM | POA: Diagnosis not present

## 2017-02-14 DIAGNOSIS — Z87891 Personal history of nicotine dependence: Secondary | ICD-10-CM | POA: Insufficient documentation

## 2017-02-14 DIAGNOSIS — Z9889 Other specified postprocedural states: Secondary | ICD-10-CM | POA: Diagnosis not present

## 2017-02-15 ENCOUNTER — Encounter: Payer: Self-pay | Admitting: Cardiovascular Disease

## 2017-02-15 ENCOUNTER — Ambulatory Visit (INDEPENDENT_AMBULATORY_CARE_PROVIDER_SITE_OTHER): Payer: Medicare Other | Admitting: Cardiovascular Disease

## 2017-02-15 VITALS — BP 100/70 | HR 71 | Ht 65.0 in | Wt 128.4 lb

## 2017-02-15 DIAGNOSIS — Z9889 Other specified postprocedural states: Secondary | ICD-10-CM | POA: Diagnosis not present

## 2017-02-15 DIAGNOSIS — I959 Hypotension, unspecified: Secondary | ICD-10-CM | POA: Diagnosis not present

## 2017-02-15 NOTE — Patient Instructions (Addendum)
Medication Instructions:  Your physician recommends that you continue on your current medications as directed. Please refer to the Current Medication list given to you today.  Increase your intake of fluids (water with electrolyte tabs like Nun tablets, or gatorade) , protein ( hard boiled eggs, chicken, fish) , and a electrolytes ( V-8 juice, salt, potassium chloride  which is sold as No-Salt   Labwork: None Ordered   Testing/Procedures: None Ordered   Follow-Up: Your physician wants you to follow-up in: 6 months with Dr. Acie Fredrickson.  You will receive a reminder letter in the mail two months in advance. If you don't receive a letter, please call our office to schedule the follow-up appointment.   If you need a refill on your cardiac medications before your next appointment, please call your pharmacy.   Thank you for choosing [facility]! Christen Bame, RN [phone]

## 2017-02-15 NOTE — Progress Notes (Signed)
Cardiology Office Note    Date:  02/15/2017   ID:  Destiny Andersen, DOB March 04, 1946, MRN [id number]  PCP:  Darcus Austin, MD  Cardiologist:  Dr. Acie Fredrickson  Chief Complaint: s/p MV repair  History of Present Illness:   Destiny Andersen is a 71 y.o. female with a history of mitral valve prolapse with severe MR s/p minimally invasive mitral valve repair and PFO closure who walked in for heart racing.  The patient reportedly was told that she had mitral valve prolapse many years ago. Approximately 3 years ago she was noted by her primary care physician to have a prominent murmur on physical exam and echocardiogram revealed the presence of mitral valve prolapse with severe mitral regurgitation. She has been followed carefully by Dr. Acie Fredrickson ever since. She has remained physically active and reportedly asymptomatic. She was seen in follow-up recently by Dr. Acie Fredrickson and transthoracic echocardiogram revealed mitral valve prolapse with a flail segment of the posterior leaflet of mitral valve and severe mitral regurgitation. Left ventricular size and systolic function remains normal. Transesophageal echocardiogram was performed, confirming the presence of a flail segment of the posterior leaflet with ruptured chordae tendineae. L/RHC on 12/21/15 showed normal coronary arteries, normal LV function and severe MR. The patient was referred for surgical consultation.   She ultimately underwent minimally invasive mitral valve repair and PFO closure with Dr. Roxy Manns on 02/01/16. Her post op course was c/b post op thrombocytopenia and volume overload requiring diuresis. The patient was not discharged from the hospital on a beta blocker because of bradycardia during the first couple days after surgery.  Last seen in clinic 02/17/16 for  for follow up. She has been feeling pretty good since the surgery except her back hurts. EKG showed sinus tachycardia at rate of 103 bpm.  The patient was started on metorpolol 12.5mg  BID  due to tachycardia by Dr. Clydene Laming 02/21/16.   Came for INR check and while walking back to car in parking lot she noted HR of 110 and came back for further evaluation. The patient denies nausea, vomiting, fever, chest pain, palpitations, shortness of breath, orthopnea, PND, dizziness, syncope, cough, congestion, abdominal pain, hematochezia, melena, lower extremity edema. Had one episode of throat sensation this morning.   Jan. 8, 2018:  Destiny Andersen is doing well.   we had increased her metoprolol slightly at her last visit  Wears her fit bit and her HR is much better  Doing cardiac rehab.   Doing well.    Has some scar tenderness and rib tenderness.   Aug. 31, 2018: Destiny Andersen is seen today for follow up of her MV repair and PFO closure.  Recent echo shows EF 55-60%.   , good repair of the MV . Mild TR Her incision has healed well It was tight ,   She used a silicone gel to help it heal .  Is walking 30 minutes a day.      Past Medical History:  Diagnosis Date  . Cancer [facility]) 1983   Cervical cancer.  Skin Cancer- leg  . Chronic headaches    "? Migraines"- does not have then any more  . Constipation   . Elevated C-reactive protein (CRP)   . Family history of breast cancer    Mother  . Fatigue   . Heart murmur    ECHO 02/10/13 EF = 60-65%, moderate posterior mitral valve prolapse w/possible flail segment, severe MR, mitral regurgitant jet is anteriorly directed.  Marland Kitchen History of blood transfusion  pre-eclampsia   . Hyperpotassemia    Migrated  . Medicare annual wellness visit, subsequent   . Mitral valve prolapse   . MR (mitral regurgitation)    ECHO 02/10/13 EF = 60-65%, moderate posterior mitral valve prolapse w/possible flail segment, severe MR, mitral regurgitant jet is anteriorly directed.  . Risk for falls   . Routine general medical examination at a health care facility   . S/P minimally invasive mitral valve repair 02/01/2016   Complex valvuloplasty including triangular resection of  flail posterior leaflet, artificial Gore-tex neochord placement x10 and 28 mm Sorin Memo 3D ring annuloplasty via right mini thoracotomy approach  . Seizures ([place])    with pre- echa  . Shortness of breath dyspnea    with exertion    Past Surgical History:  Procedure Laterality Date  . BREAST BIOPSY Right 1998   benign cyst  . CARDIAC CATHETERIZATION N/A 12/21/2015   Procedure: Right/Left Heart Cath and Coronary Angiography;  Surgeon: Sherren Mocha, MD;  Location: [facility];  Service: Cardiovascular;  Laterality: N/A;  . COLONOSCOPY  08/2001   Colon cancer screening in 01/2012  . LAPAROSCOPIC CHOLECYSTECTOMY  1990  . MITRAL VALVE REPAIR Right 02/01/2016   Procedure: MINIMALLY INVASIVE MITRAL VALVE REPAIR (MVR) with closure of PFO;  Surgeon: Rexene Alberts, MD;  Location: [facility];  Service: Open Heart Surgery;  Laterality: Right;  . TEE WITHOUT CARDIOVERSION N/A 10/14/2015   Procedure: TRANSESOPHAGEAL ECHOCARDIOGRAM (TEE);  Surgeon: Thayer Headings, MD;  Location: [facility];  Service: Cardiovascular;  Laterality: N/A;  . TEE WITHOUT CARDIOVERSION N/A 02/01/2016   Procedure: TRANSESOPHAGEAL ECHOCARDIOGRAM (TEE);  Surgeon: Rexene Alberts, MD;  Location: [place];  Service: Open Heart Surgery;  Laterality: N/A;  . TOTAL ABDOMINAL HYSTERECTOMY  1983   Cervical cancer 1983    Current Medications: Prior to Admission medications   Medication Sig Start Date End Date Taking? Authorizing Provider  acetaminophen (TYLENOL) 325 MG tablet Take 650 mg by mouth every 6 (six) hours as needed.    Historical Provider, MD  aspirin 81 MG tablet Take 81 mg by mouth daily.    Historical Provider, MD  calcium citrate-vitamin D (CITRACAL+D) 315-200 MG-UNIT tablet Take 1 tablet by mouth 3 (three) times daily. VITAMIN D DOSAGE IS 250MG  INSTEAD OF 200    Historical Provider, MD  diclofenac sodium (VOLTAREN) 1 % GEL Apply 2 g topically as needed (back pain). 02/07/16   Donielle Liston Alba, PA-C  metoprolol  tartrate (LOPRESSOR) 25 MG tablet Take 0.5 tablets (12.5 mg total) by mouth 2 (two) times daily. 02/21/16   Rexene Alberts, MD  traMADol (ULTRAM) 50 MG tablet Take 1 tablet (50 mg total) by mouth every 6 (six) hours as needed for moderate pain. 02/07/16   Donielle Liston Alba, PA-C  warfarin (COUMADIN) 2.5 MG tablet Take 1 to 1.5 tablets by mouth daily as directed by coumadin clinic 03/09/16   Thayer Headings, MD    Allergies:   Penicillins; Codeine; Doxycycline hyclate; and Ultram [tramadol]   Social History   Social History  . Marital status: Married    Spouse name: N/A  . Number of children: 1  . Years of education: N/A   Social History Main Topics  . Smoking status: Former Smoker    Years: 25.00    Types: Cigarettes    Quit date: 06/18/1985  . Smokeless tobacco: Never Used  . Alcohol use 1.0 oz/week    2 Standard drinks or equivalent per week  Comment: rare  . Drug use: No  . Sexual activity: No   Other Topics Concern  . None   Social History Narrative  . None     Family History:  The patient's family history includes AAA (abdominal aortic aneurysm) in her father; Breast cancer in her mother; CAD in her father and mother; Heart disease in her father and mother; High Cholesterol in her brother; Hypertension in her brother, father, and mother; Skin cancer in her brother.   ROS:   Please see the history of present illness.    ROS All other systems reviewed and are negative.   PHYSICAL EXAM:   VS:  BP 100/70   Pulse 71   Ht 5\' 5"  (1.651 m)   Wt 128 lb 6.4 oz (58.2 kg)   SpO2 98%   BMI 21.37 kg/m    GEN: thin anxious female in no acute distress  HEENT: normal  Neck: no JVD, carotid bruits, or masses Cardiac: RRR; no murmurs, rubs, or gallops,no edema  Her upper right chest scar is tender. Has right rib tenderness.   Respiratory:  clear to auscultation bilaterally, normal work of breathing GI: soft, nontender, nondistended, + BS MS: no deformity or atrophy  Skin:  warm and dry, no rash Neuro:  Alert and Oriented x 3, Strength and sensation are intact Psych: euthymic mood, full affect  Wt Readings from Last 3 Encounters:  02/15/17 128 lb 6.4 oz (58.2 kg)  02/04/17 124 lb 6.4 oz (56.4 kg)  07/23/16 126 lb 12.2 oz (57.5 kg)      Studies/Labs Reviewed:   EKG:  EKG is ordered today.   Aug. 31, 2018:   NSR at 68.   Marland Kitchen   Recent Labs: No results found for requested labs within last 8760 hours.   Lipid Panel No results found for: CHOL, TRIG, HDL, CHOLHDL, VLDL, LDLCALC, LDLDIRECT  Additional studies/ records that were reviewed today include:   As above  ASSESSMENT & PLAN:   1.    Hypotension:    BP is borderline low HR is well controlled on current dose Of metoprolol so I hesitate to decrease the dose. We'll have her increase her salt intake slightly by having her drink a V8 juice each day. She will continue to take her heart rate and blood pressure measurements and will call us in several weeks. If this does not help resolve the hypotension, we'll consider cutting back on the metoprolol.  2. Mitral valve prolapse with severe MR:   s/p minimally invasive mitral valve repair and PFO closure: doing well.       Signed, Mertie Moores, MD  02/15/2017 9:10 AM    [facility] [place], [place], [place]  [address] Phone: [phone]; Fax: [phone]

## 2017-03-04 ENCOUNTER — Telehealth: Payer: Self-pay | Admitting: Cardiovascular Disease

## 2017-03-04 NOTE — Telephone Encounter (Signed)
New message    Pt is calling about her BP.   98/62-today   She said she is using [facility] daily skin serum on her scar.

## 2017-03-04 NOTE — Telephone Encounter (Signed)
Current not having any symptoms. Continue current medications. She'll call us back if she develops problems.

## 2017-03-04 NOTE — Telephone Encounter (Signed)
Spoke with patient who states she has increased her intake of salt, including drinking a V8 daily and occasionally eating a salty snack. She denies light-headedness, dizziness, or fatigue. She states she feels steady on her feet and denies problems with position changes. She states her HR has been stable @ approximately 63 bpm at resting. She states she does feel better with less palpitations with current dose of metoprolol. I advised that if she is tolerating this BP without symptoms of hypotension, then she can continue current medications as Dr. Acie Fredrickson is comfortable with her BP at this number. I advised her to call back if symptoms worsen or if she has additional questions or concerns. She verbalized understanding and agreement and thanked me for the call.

## 2017-03-27 ENCOUNTER — Other Ambulatory Visit: Payer: Self-pay | Admitting: Cardiovascular Disease

## 2017-06-27 ENCOUNTER — Other Ambulatory Visit: Payer: Self-pay | Admitting: Family Medicine

## 2017-06-27 DIAGNOSIS — M8580 Other specified disorders of bone density and structure, unspecified site: Secondary | ICD-10-CM

## 2017-06-27 DIAGNOSIS — Z1231 Encounter for screening mammogram for malignant neoplasm of breast: Secondary | ICD-10-CM

## 2017-07-07 ENCOUNTER — Other Ambulatory Visit: Payer: Self-pay | Admitting: Cardiovascular Disease

## 2017-07-08 NOTE — Telephone Encounter (Signed)
Pt's pharmacy requesting a refill on clindamycin 300 mg tablet. Would you like to refill this medication? Please advise

## 2017-07-09 ENCOUNTER — Other Ambulatory Visit: Payer: Self-pay | Admitting: Cardiovascular Disease

## 2017-07-22 ENCOUNTER — Ambulatory Visit
Admission: RE | Admit: 2017-07-22 | Discharge: 2017-07-22 | Disposition: A | Discharge status: Home / Self Care | Payer: Medicare Other | Source: Ambulatory Visit | Attending: Family Medicine | Admitting: Family Medicine

## 2017-07-22 DIAGNOSIS — M8580 Other specified disorders of bone density and structure, unspecified site: Secondary | ICD-10-CM

## 2017-07-22 DIAGNOSIS — Z1231 Encounter for screening mammogram for malignant neoplasm of breast: Secondary | ICD-10-CM

## 2017-09-10 IMAGING — CR DG CHEST 1V PORT
1 series · 1 of 1 positions shown · non-contrast
Comparison: Prior radiograph from 01/30/2016

CLINICAL DATA: Initial evaluation for atelectasis. Status post
mitral valve repair.

EXAM:
PORTABLE CHEST 1 VIEW

[AP]
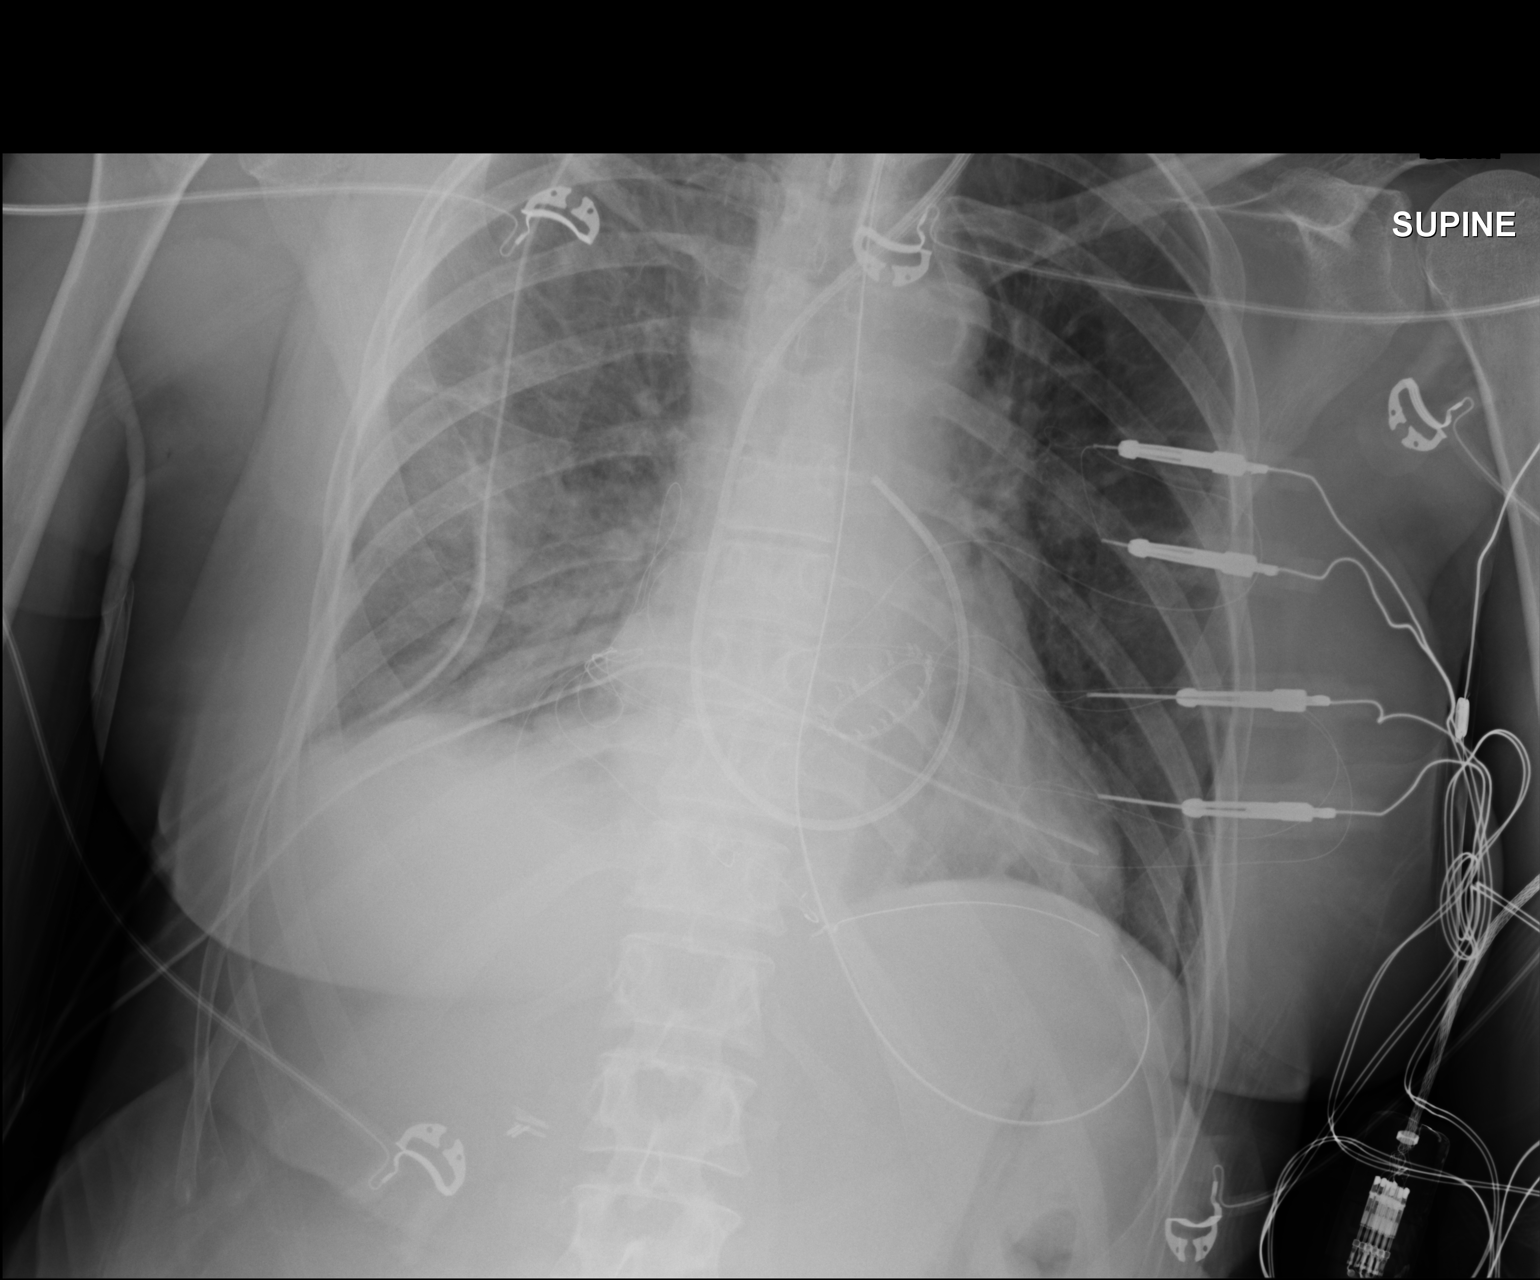

[1 of 1 positions shown; findings below may reference images not displayed]

FINDINGS: Tip of the endotracheal tube positioned 3.6 cm above the carina.
Enteric tube coiled within the stomach. Left IJ approach Swan-Ganz
catheter in place with tip overlying the main pulmonary outflow
tract. Prostatic mitral valve now visualized. Mildly increased heart
size without cardiomegaly. Mediastinal silhouette within normal
limits. Atheromatous plaque noted within the aortic arch. Epicardial
pacer wires noted.

Right-sided chest tube in place with tip overlying the right lung
apex. Second chest tube in place extending from a right-sided
approach with tip overlying the left heart border. No appreciable
pneumothorax. Diffuse vascular congestion with interstitial
prominence throughout the right lung, suggestive of edema. Probable
right basilar atelectasis. Left lung is clear.

No acute osseous abnormality.
IMPRESSION: 1. Pulmonary vascular congestion with interstitial prominence
throughout the right lung with probable superimposed mild
atelectatic changes at the right lung base. Left lung is clear.
2. Interval placement of prosthetic mitral valve. Support apparatus
in place as above.
3. Right-sided chest tubes in place.  No appreciable pneumothorax.

## 2017-09-14 IMAGING — CR DG CHEST 1V PORT
1 series · 1 of 1 positions shown · non-contrast
Comparison: 02/04/2016.

CLINICAL DATA: Status post mitral valve repair.

EXAM:
PORTABLE CHEST 1 VIEW

[AP]
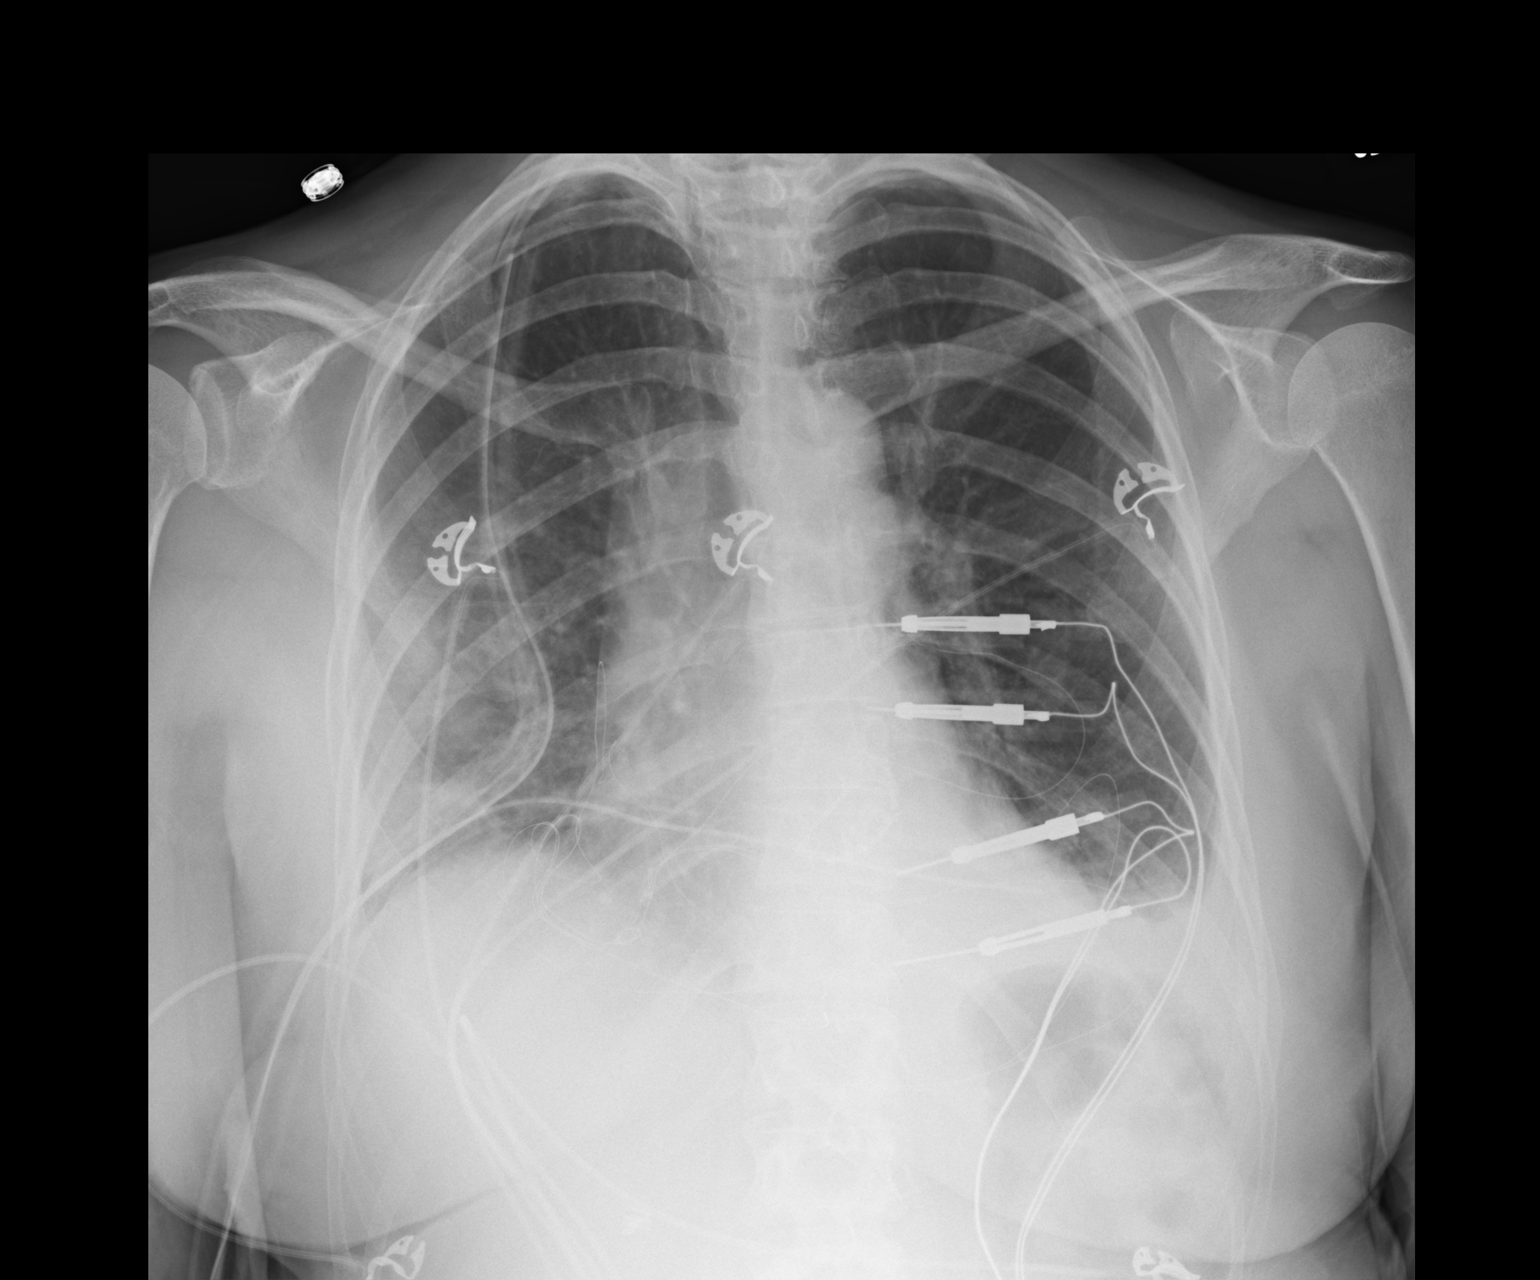

[1 of 1 positions shown; findings below may reference images not displayed]

FINDINGS: Cardiac silhouette within normal limits for size. MVR remain
satisfactory position. Overlying electrodes appears stable.

RIGHT chest tube in good position. The apical pneumothorax seen on
yesterday's film is not visible today. Small BILATERAL effusions,
greater on the LEFT.
IMPRESSION: RIGHT pneumothorax no longer visualized.  Otherwise stable chest.

## 2017-09-16 IMAGING — CR DG CHEST 2V
2 series · 2 of 2 positions shown · non-contrast
Comparison: Portable chest x-ray February 05, 2016

CLINICAL DATA: Chest soreness, recent mitral valve repair.

EXAM:
CHEST  2 VIEW

[chest pa]
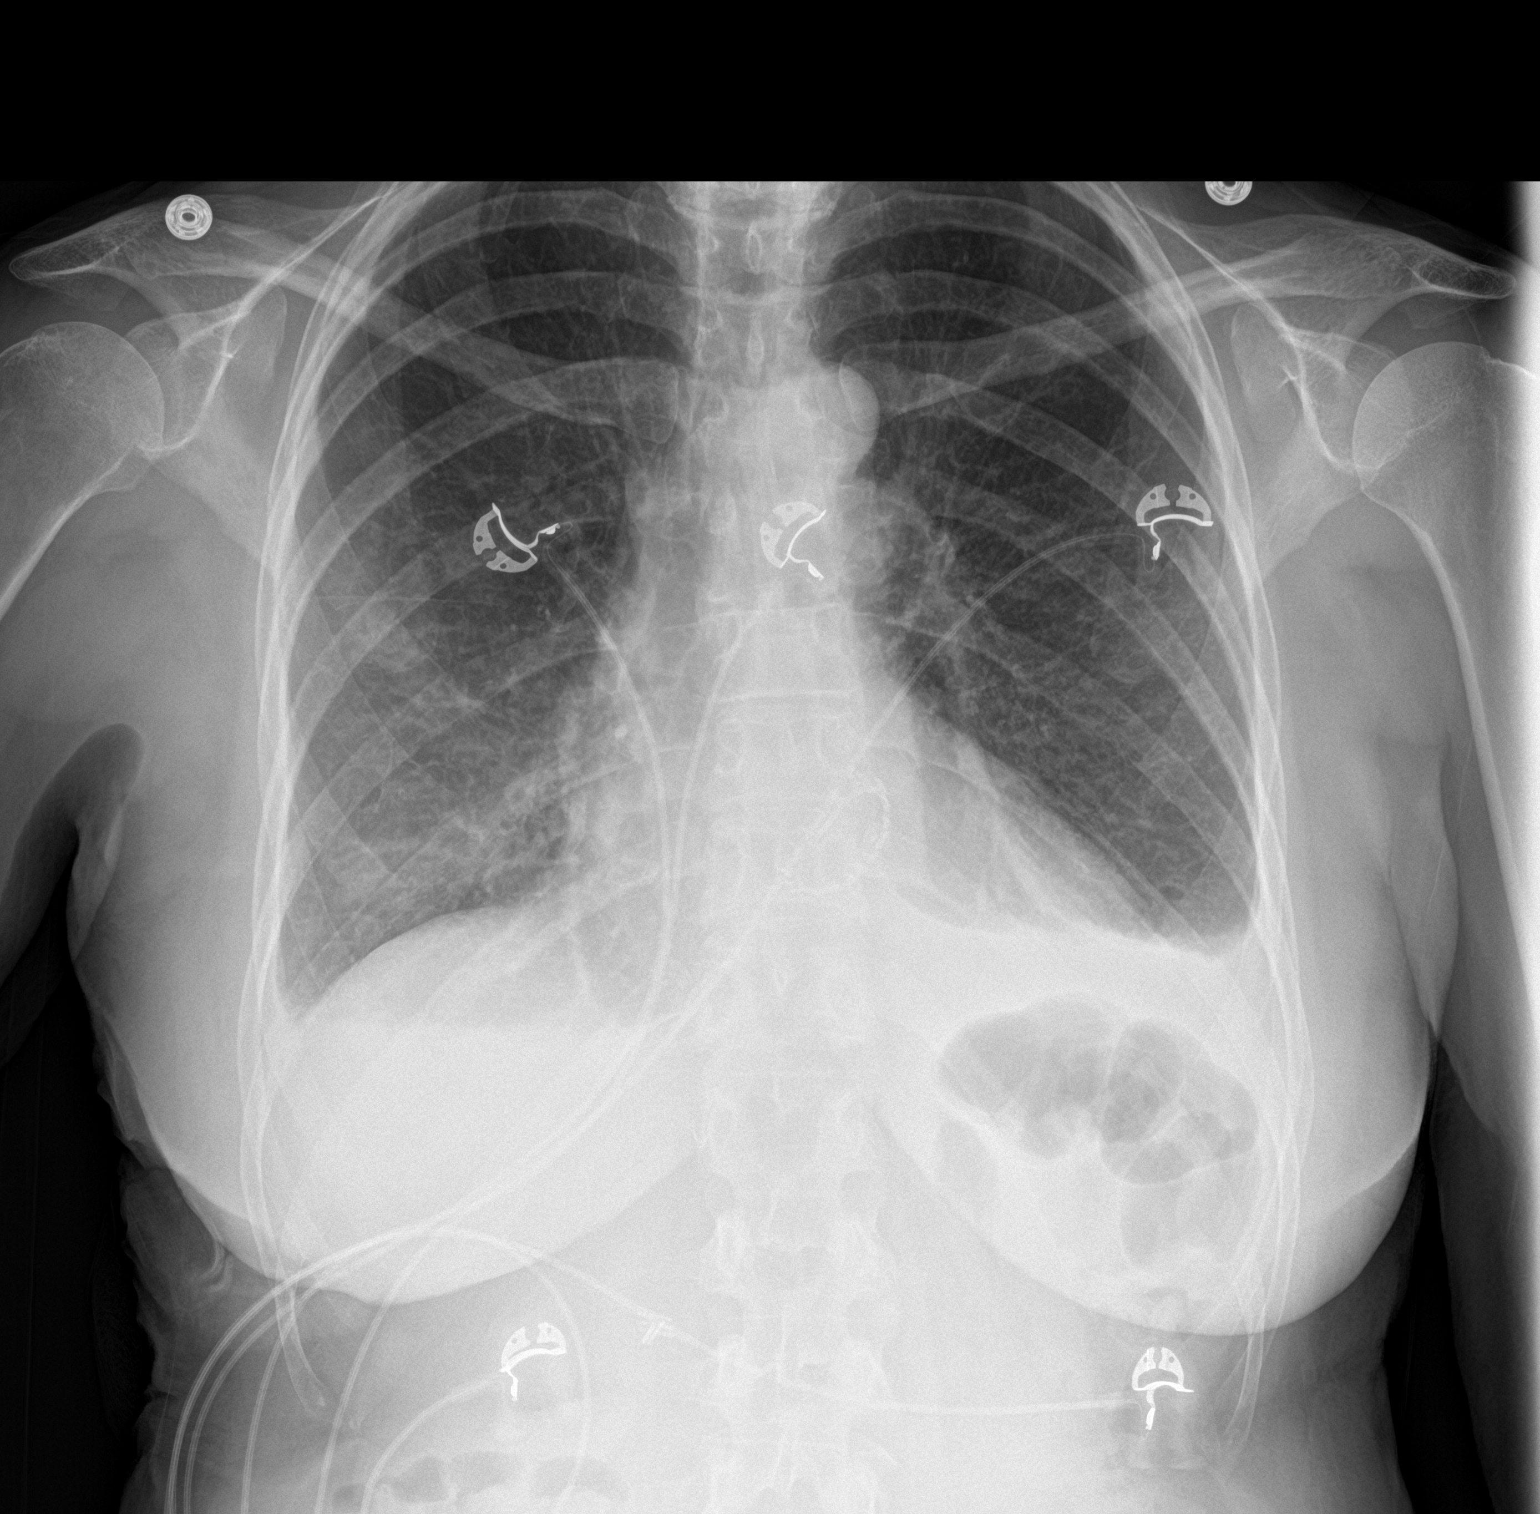

[chest lat]
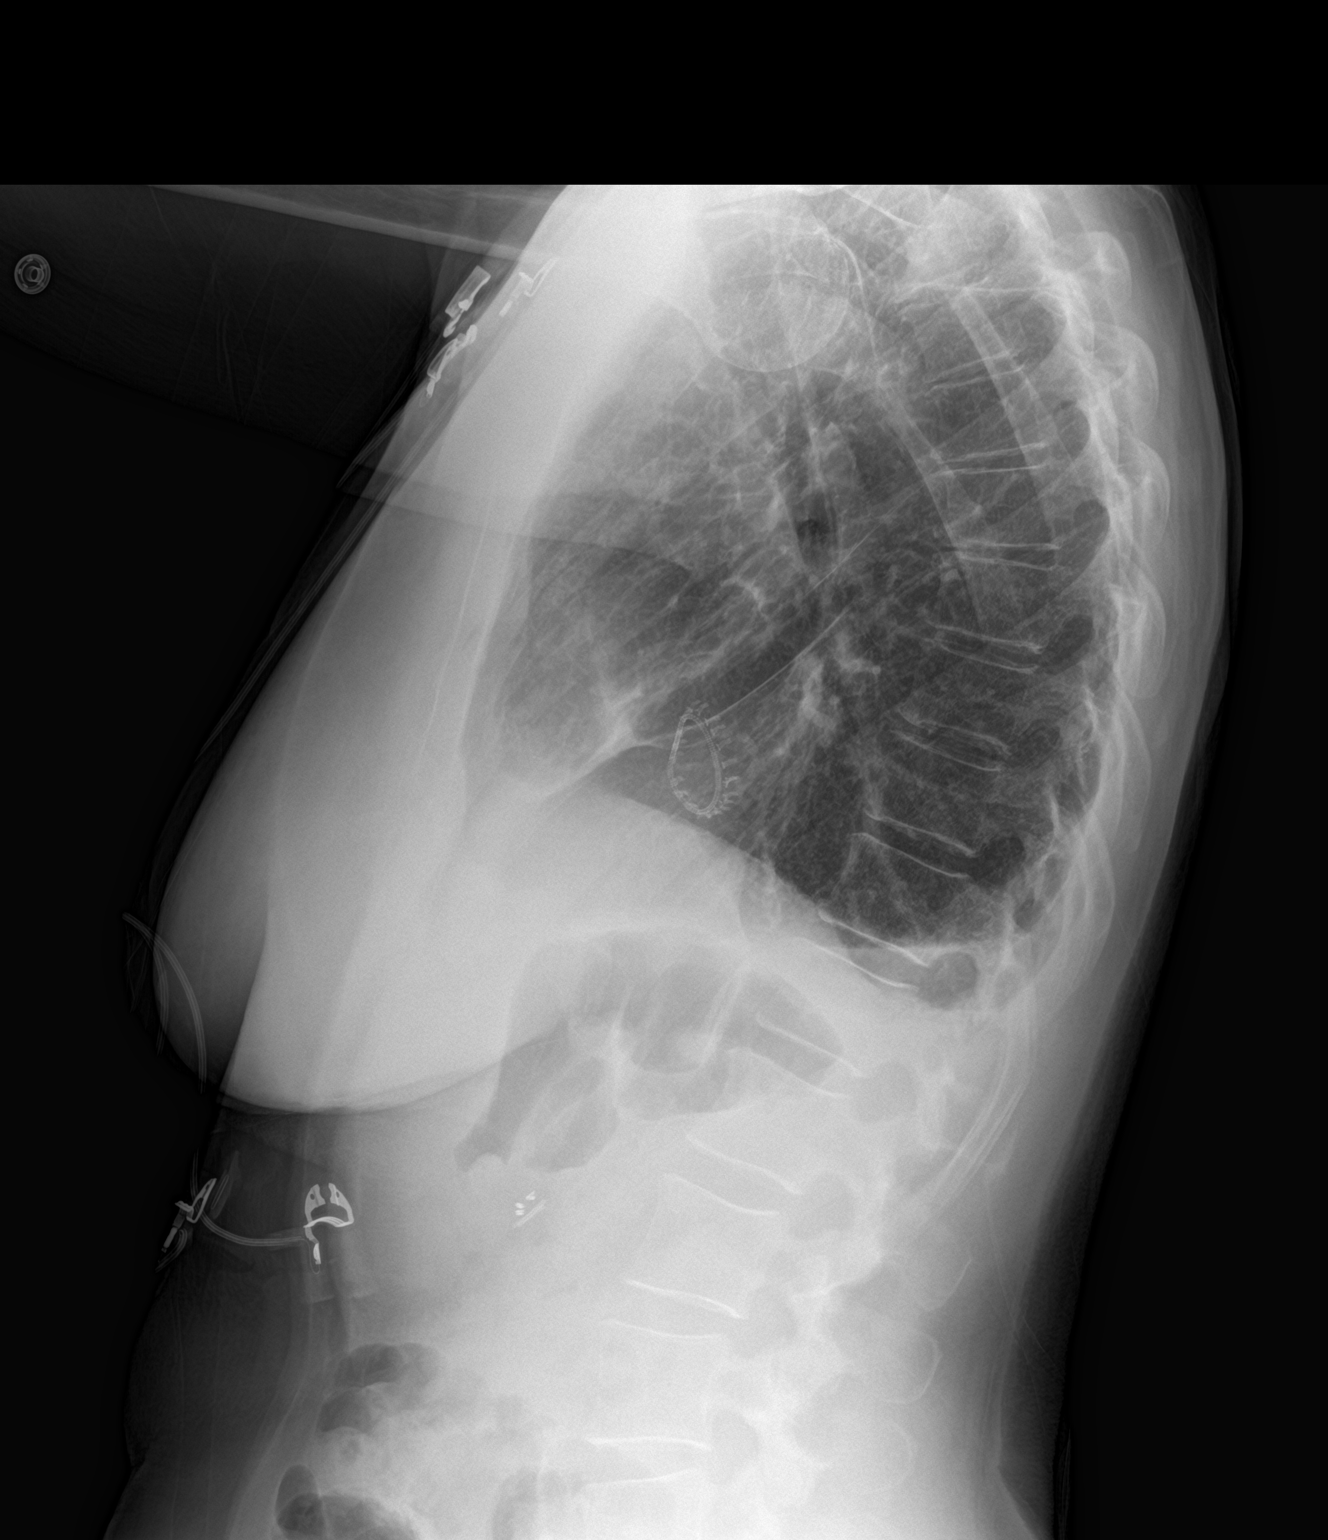

[2 of 2 positions shown; findings below may reference images not displayed]

FINDINGS: There remain small bilateral pleural effusions. The lungs are
well-expanded. The cardiac silhouette is top-normal in size. The
pulmonary vascularity is normal. The prosthetic mitral valve ring is
demonstrated. There is calcification in the wall of the aortic arch.
There is are right-sided cervical rib. The thoracic vertebral bodies
are preserved in height.
IMPRESSION: Small bilateral pleural effusions. No alveolar edema or pneumonia or
pneumothorax.

Aortic atherosclerosis.

## 2017-12-02 ENCOUNTER — Encounter: Payer: Self-pay | Admitting: Sports Medicine

## 2017-12-02 ENCOUNTER — Ambulatory Visit: Payer: Medicare Other | Admitting: Sports Medicine

## 2017-12-02 VITALS — BP 106/64 | Ht 64.5 in | Wt 132.0 lb

## 2017-12-02 DIAGNOSIS — M25562 Pain in left knee: Secondary | ICD-10-CM | POA: Diagnosis not present

## 2017-12-02 NOTE — Progress Notes (Signed)
   Subjective:    Patient ID: Destiny Andersen, female    DOB: July 28, 1945, 72 y.o.   MRN: [id number]  HPI chief complaint: Left knee pain  Very pleasant 72 year old female comes in today complaining of 7 months of left knee pain. No injury that she can recall but a sudden onset of pain that began one day after walking up and down a lot of stairs. She describes an aching discomfort primarily in the posterior aspect of her knee .It is constantly present but worse with activity. She may have noticed a little bit of swelling. She definitely endorses and inability to completely flex the left knee. She denies locking but gets frequent buckling of the knee. She denies previous problems with either knee in the past. She was seen at [facility] and an x-ray was done which was negative per her report. She has tried compression, heat, ice, and pickled ginger ( which has been extremely helpful). She is unable to take NSAIDs due to some mild renal insufficiency. She denies numbness or tingling.  Past medical history reviewed Medications reviewed Allergies reviewed    Review of Systems As above    Objective:   Physical Exam  Well-developed, well-nourished. No acute distress. Awake alert and oriented 3. Vital signs reviewed  Left knee: Range of motion is 0-130. Trace effusion. She is tender to palpation along the lateral joint line with a positive Thessaly's. No tenderness along the medial joint line. Knee is stable to ligamentous exam. Negative McMurray's. Neurovascularly intact distally.  MSK ultrasound of the left knee was performed. Limited images were obtained. Patient has a moderate-sized joint effusion. She has some spurring along the lateral joint line with some bulging of the lateral meniscus. No Baker's cyst is seen.      Assessment & Plan:   Left knee pain and swelling secondary to DJD versus degenerative lateral meniscal tear  Patient will continue with compression when active.  She will start isometric quad exercises daily and will follow-up with me in 3 weeks for reevaluation. If symptoms persist, then consider merits of cortisone injection versus MRI to rule out degenerative meniscal tear. I've asked her to drop off a copy of her x-rays for me to review. She enjoys recreational walking and I recommended that she ice afterwards. I've also recommended that she think about substituting an exercise bike for walking until her knee feels better. She may also continue using her pickled ginger as needed.

## 2017-12-23 ENCOUNTER — Ambulatory Visit (INDEPENDENT_AMBULATORY_CARE_PROVIDER_SITE_OTHER): Payer: Medicare Other | Admitting: Sports Medicine

## 2017-12-23 ENCOUNTER — Encounter: Payer: Self-pay | Admitting: Physician Assistant

## 2017-12-23 VITALS — BP 96/68 | Ht 64.5 in | Wt 130.0 lb

## 2017-12-23 DIAGNOSIS — M25562 Pain in left knee: Secondary | ICD-10-CM

## 2017-12-23 NOTE — Progress Notes (Signed)
   Subjective:    Patient ID: Destiny Andersen, female    DOB: 08-23-1945, 72 y.o.   MRN: [id number]  HPI   Patient comes in today for follow-up on left knee pain. She states that her symptoms are about 30% improved. She has been wearing her compression sleeve. She has been diligent about her home exercises. She still has pain in the posterior lateral aspect of her knee but it is not as severe as it was. She still gets intermittent swelling as well as frequent feelings of instability or wanting to give way. I was able to review her x-rays which were done at a local orthopedist office. She has minimal degenerative changes on those films.    Review of Systems As above    Objective:   Physical Exam  Well developed, well-nourished. No acute distress. Awake alert and oriented 3. Vital signs reviewed  Left knee: Full range of motion. No obvious effusion today. She is slightly tender to palpation along the lateral joint line. Good ligament stability. Neurovascularly intact distally. Walking with a slight limp.      Assessment & Plan:   Left knee pain likely secondary to degenerative meniscal tear  I discussed options with her including MRI, formal physical therapy, cortisone injection, and simply continuing with our current treatment of compression and home exercises. She would like to stick with the current treatment plan for now but will follow-up with me again in 3-4 weeks.If symptoms plateau or worsen then we can reconsider all other options. Patient is encouraged to call me with questions or concerns prior to her follow-up visit.

## 2018-01-13 ENCOUNTER — Encounter: Payer: Self-pay | Admitting: Sports Medicine

## 2018-01-13 ENCOUNTER — Ambulatory Visit: Payer: Medicare Other | Admitting: Sports Medicine

## 2018-01-13 VITALS — BP 104/74 | Ht 64.5 in | Wt 132.0 lb

## 2018-01-13 DIAGNOSIS — M25562 Pain in left knee: Secondary | ICD-10-CM

## 2018-01-13 NOTE — Progress Notes (Signed)
   Subjective:    Patient ID: Destiny Andersen, female    DOB: 06-Aug-1945, 72 y.o.   MRN: [id number]  HPI Patient is a 72 year old female who presents today following up on left knee pain.  Patient was seen about 6 weeks ago for left knee pain thought to be secondary to meniscal damage.  Patient reports that she has been doing her home exercises 3 times a day for the past 6 weeks.  She initially reports improvement in his symptoms in the first 3 weeks.  Last week patient reports that she has reached a plateau and do not think that pain has significantly changed.  She continues to report intermittent knee swelling. Patient does wear compression sleeve for swelling. She also reports that her knee has been buckling at times. Patient has been eating pickle ginger for pain since she is unable to take any NSAIDs.  She reports good relief from that.  She also has been doing the recumbent bike as well as rowing at the gym daily for 30 minutes combined.   Review of Systems  Musculoskeletal: Positive for joint swelling.       Left knee pain  Skin: Negative.   Neurological: Negative.        Objective:   Physical Exam General: NAD, pleasant, able to participate in exam Knee exam: No deformities, mild swelling, no bruising, no atrophy, misalignment, no effusion. Full range of motion (extension, flexion). Tender to palpation along lateral joint line, no tenderness along patella tendon, MCL, . Strength 5/5 in all fields, sensation intact. Patellar grind test negative. Patella without significant amount of laxity in medial and lateral planes. Stable to varus and valgus stress. Anterior and posterior drawers negative. Lachman's negative. McMurray negative and Thessaly positive.        Assessment & Plan:   Left knee pain, chronic, minimal improvement Patient presented with chronic lateral left knee pain thought to be secondary to meniscal damage.  She has been using conservative measures for the past 6 weeks  with minimal improvement in her symptoms.  On exam, patient has a positive Thessaly concerning for meniscal tear.  Given minimal improvement with current therapy and patient's desire for further imaging, we will order an MRI of her left knee to further evaluate extent of suspected degenerative changes .  Patient will follow-up after MRI to discuss results and treatment options.  Patient is open to steroid injection as well as formal physical therapy.  Patient seen and evaluated with the resident.I agree with the above plan of care.Patient's x-rays show only minimal degenerative changes. She continues to struggle with knee pain despite conservative treatment for the past 6 weeks. At this point we will order an MRI of her left knee specifically to rule out a meniscal tear. Phone follow-up with those findings once available. We will delineate further treatment based on those results.

## 2018-01-16 ENCOUNTER — Ambulatory Visit: Payer: Medicare Other | Admitting: Physician Assistant

## 2018-01-17 ENCOUNTER — Ambulatory Visit
Admission: RE | Admit: 2018-01-17 | Discharge: 2018-01-17 | Disposition: A | Discharge status: Home / Self Care | Payer: Medicare Other | Source: Ambulatory Visit | Attending: Sports Medicine | Admitting: Sports Medicine

## 2018-01-17 DIAGNOSIS — M25562 Pain in left knee: Secondary | ICD-10-CM

## 2018-01-21 ENCOUNTER — Telehealth: Payer: Self-pay | Admitting: Sports Medicine

## 2018-01-21 ENCOUNTER — Other Ambulatory Visit: Payer: Self-pay | Admitting: *Deleted

## 2018-01-21 DIAGNOSIS — M1712 Unilateral primary osteoarthritis, left knee: Secondary | ICD-10-CM

## 2018-01-21 NOTE — Telephone Encounter (Signed)
I spoke with the patient on the phone today after reviewing the MRI of her left knee. No obvious meniscal tear. Mild medial compartment DJD. Patient is reassured of these findings. I would like for her to start formal physical therapy to work on left lower extremity strengthening. She may wean to a home exercise program per the therapist's discretion. I also discussed the possibility of a cortisone injection if symptoms persist. She will follow-up with me for ongoing or recalcitrant issues.

## 2018-01-29 ENCOUNTER — Encounter: Payer: Self-pay | Admitting: Physical Therapy

## 2018-01-29 ENCOUNTER — Ambulatory Visit: Payer: Medicare Other | Attending: Sports Medicine | Admitting: Physical Therapy

## 2018-01-29 DIAGNOSIS — M25562 Pain in left knee: Secondary | ICD-10-CM | POA: Diagnosis not present

## 2018-01-29 DIAGNOSIS — G8929 Other chronic pain: Secondary | ICD-10-CM | POA: Insufficient documentation

## 2018-01-29 NOTE — Therapy (Signed)
[place], [place], [address] Phone: [phone]   Fax:  [phone]  Physical Therapy Evaluation  Patient Details  Name: Destiny Andersen MRN: [id number] Date of Birth: 07-25-1945 Referring Provider: Renata Caprice   Encounter Date: 01/29/2018  PT End of Session - 01/29/18 1757    Visit Number  1    Number of Visits  12    Date for PT Re-Evaluation  03/12/18    PT Start Time  1630    PT Stop Time  1730    PT Time Calculation (min)  60 min    Activity Tolerance  Patient tolerated treatment well    Behavior During Therapy  [facility] for tasks assessed/performed       Past Medical History:  Diagnosis Date  . Cancer [facility]) 1983   Cervical cancer.  Skin Cancer- leg  . Chronic headaches    "? Migraines"- does not have then any more  . Constipation   . Elevated C-reactive protein (CRP)   . Family history of breast cancer    Mother  . Fatigue   . Heart murmur    ECHO 02/10/13 EF = 60-65%, moderate posterior mitral valve prolapse w/possible flail segment, severe MR, mitral regurgitant jet is anteriorly directed.  Marland Kitchen History of blood transfusion    pre-eclampsia   . Hyperpotassemia    Migrated  . Medicare annual wellness visit, subsequent   . Mitral valve prolapse   . MR (mitral regurgitation)    ECHO 02/10/13 EF = 60-65%, moderate posterior mitral valve prolapse w/possible flail segment, severe MR, mitral regurgitant jet is anteriorly directed.  . Risk for falls   . Routine general medical examination at a health care facility   . S/P minimally invasive mitral valve repair 02/01/2016   Complex valvuloplasty including triangular resection of flail posterior leaflet, artificial Gore-tex neochord placement x10 and 28 mm Sorin Memo 3D ring annuloplasty via right mini thoracotomy approach  . Seizures ([place])    with pre- echa  . Shortness of breath dyspnea    with exertion    Past Surgical History:  Procedure  Laterality Date  . BREAST BIOPSY Right 1998   benign cyst  . CARDIAC CATHETERIZATION N/A 12/21/2015   Procedure: Right/Left Heart Cath and Coronary Angiography;  Surgeon: Sherren Mocha, MD;  Location: [place] [facility];  Service: Cardiovascular;  Laterality: N/A;  . COLONOSCOPY  08/2001   Colon cancer screening in 01/2012  . LAPAROSCOPIC CHOLECYSTECTOMY  1990  . MITRAL VALVE REPAIR Right 02/01/2016   Procedure: MINIMALLY INVASIVE MITRAL VALVE REPAIR (MVR) with closure of PFO;  Surgeon: Rexene Alberts, MD;  Location: [place];  Service: Open Heart Surgery;  Laterality: Right;  . TEE WITHOUT CARDIOVERSION N/A 10/14/2015   Procedure: TRANSESOPHAGEAL ECHOCARDIOGRAM (TEE);  Surgeon: Thayer Headings, MD;  Location: [place];  Service: Cardiovascular;  Laterality: N/A;  . TEE WITHOUT CARDIOVERSION N/A 02/01/2016   Procedure: TRANSESOPHAGEAL ECHOCARDIOGRAM (TEE);  Surgeon: Rexene Alberts, MD;  Location: [place];  Service: Open Heart Surgery;  Laterality: N/A;  . TOTAL ABDOMINAL HYSTERECTOMY  1983   Cervical cancer 1983    There were no vitals filed for this visit.   Subjective Assessment - 01/29/18 1743    Subjective  Pt relays chronic Lt knee pain that started thanksgiving 2018 after sitting with her legs crossed too long. She saw MD who thought she may have torn meniscus thus MRI was ordered which was negative for ligamentous pathology  but does show mild medial degenerative chondrosis. She complains of pain in posterior and lateral knee upon end range flexion and extension and that her knee hurts when she walks or with stairs with sometimes it feels like it will buckle.    Pertinent History  knee OA, heart mitral valve repair 2017    Diagnostic tests  MRI show mild OA, no lig damage    Patient Stated Goals  get full ROM and walk 30 min without pain    Currently in Pain?  Yes    Pain Score  6     Pain Location  Knee    Pain Orientation  Left;Posterior    Pain Descriptors / Indicators   Aching;Tightness    Pain Type  Chronic pain    Aggravating Factors   flex and ext of knee, walking, stairs    Pain Relieving Factors  pickle ginger, today STM and MHP with TENS relieved pain         [facility] PT Assessment - 01/29/18 0001      Assessment   Medical Diagnosis  chronic Lt knee pain    Referring Provider  Renata Caprice    Next MD Visit  ?    Prior Therapy  none      Precautions   Precautions  None      Restrictions   Weight Bearing Restrictions  No      Balance Screen   Has the patient fallen in the past 6 months  No      Prior Function   Level of Independence  Independent      Cognition   Overall Cognitive Status  Within Functional Limits for tasks assessed      Observation/Other Assessments   Focus on Therapeutic Outcomes (FOTO)   64% limited      Sensation   Light Touch  Appears Intact      ROM / Strength   AROM / PROM / Strength  AROM;Strength      AROM   AROM Assessment Site  Knee    Right/Left Knee  Right;Left    Right Knee Extension  0    Right Knee Flexion  140    Left Knee Extension  -6    Left Knee Flexion  120      Strength   Overall Strength Comments  Rt LE WFL, Lt LE 4+/5 grossly       Flexibility   Soft Tissue Assessment /Muscle Length  yes    Hamstrings  70 deg Lt, 90 deg Rt    Quadriceps  tight Lt4    ITB  tight Lt      Palpation   Palpation comment  TTP H.S/gastroc and IT band      Ambulation/Gait   Gait Comments  dec knee flexion and ext due to limited lt knee ROM                Objective measurements completed on examination: See above findings.      [facility] Adult PT Treatment/Exercise - 01/29/18 0001      Modalities   Modalities  Moist Heat;Electrical Stimulation      Moist Heat Therapy   Number Minutes Moist Heat  10 Minutes    Moist Heat Location  Knee      Electrical Stimulation   Electrical Stimulation Location  Rt posterior knee    Electrical Stimulation Action  IFC 10 min    Electrical Stimulation  Parameters  tolerance    Electrical  Stimulation Goals  Pain      Manual Therapy   Manual Therapy  Soft tissue mobilization    Soft tissue mobilization  STM/IASTM to H.S, gastroc, IT band             PT Education - 01/29/18 1756    Education Details  HEP, self care for T.P release, TENS, POC    Person(s) Educated  Patient    Methods  Demonstration;Handout    Comprehension  Verbalized understanding;Need further instruction          PT Long Term Goals - 01/29/18 1801      PT LONG TERM GOAL #1   Title  Pt will be I and compliant with HEP. 6 weeks 03/12/18    Status  New      PT LONG TERM GOAL #2   Title  Pt will increase FOTO to less than 40% limited. 6 weeks 03/12/18    Status  New      PT LONG TERM GOAL #3   Title  Pt will improve Lt LE strength to at least 5-/5 MMT. 6 weeks 03/12/18    Status  New      PT LONG TERM GOAL #4   Title  Pt will improve Lt knee ROM to WNL. 6 weeks 03/12/18    Status  New      PT LONG TERM GOAL #5   Title  Pt will report less than 1-2/10 pain overall with walking and stairs.6 weeks 03/12/18    Status  New             Plan - 01/29/18 1757    Clinical Impression Statement  Pt presents with chronic Lt knee pain and mild OA but her biggest issue appears to be H.S/IT band strain and tightness. She has decreased Lt LE strength, decreased ROM, pain with end ROM and resisted knee flexion and pain with walking and stairs. She will benefit from PT to address these deficits. She was treated with STM/IASTM and MHP with TENS today which eliminated her pain and she was able to ambulate out without pain.    Rehab Potential  Good    PT Frequency  2x / week   1-2   PT Duration  6 weeks    PT Treatment/Interventions  ADLs/Self Care Home Management;Cryotherapy;Electrical Stimulation;Iontophoresis 4mg /ml Dexamethasone;Moist Heat;Ultrasound;Therapeutic exercise;Therapeutic activities;Neuromuscular re-education;Manual techniques;Passive range of motion;Dry  needling;Taping    PT Next Visit Plan  review HEP, H.S stretching and LE strengthening, MT and modalites       Patient will benefit from skilled therapeutic intervention in order to improve the following deficits and impairments:  Abnormal gait, Decreased activity tolerance, Decreased range of motion, Decreased strength, Impaired flexibility, Increased fascial restricitons, Pain  Visit Diagnosis: Chronic pain of left knee     Problem List Patient Active Problem List   Diagnosis Date Noted  . Hypotension 02/15/2017  . Long term (current) use of anticoagulants [Z79.01] 02/09/2016  . History of mitral valve repair 02/01/2016    Debbe Odea, PT, DPT 01/29/2018, 6:04 PM  [facility] [address] [place], [place], [address] Phone: [phone]   Fax:  [phone]  Name: Destiny Andersen MRN: [id number] Date of Birth: February 15, 1946

## 2018-01-29 NOTE — Patient Instructions (Addendum)

## 2018-02-11 ENCOUNTER — Encounter: Payer: Self-pay | Admitting: Cardiology

## 2018-02-11 ENCOUNTER — Ambulatory Visit: Payer: Medicare Other | Admitting: Physical Therapy

## 2018-02-11 ENCOUNTER — Ambulatory Visit: Payer: Medicare Other | Admitting: Cardiology

## 2018-02-11 ENCOUNTER — Encounter: Payer: Self-pay | Admitting: Physical Therapy

## 2018-02-11 VITALS — BP 120/74 | HR 68 | Ht 64.0 in | Wt 132.2 lb

## 2018-02-11 DIAGNOSIS — M25562 Pain in left knee: Secondary | ICD-10-CM | POA: Diagnosis not present

## 2018-02-11 DIAGNOSIS — G8929 Other chronic pain: Secondary | ICD-10-CM

## 2018-02-11 DIAGNOSIS — Z9889 Other specified postprocedural states: Secondary | ICD-10-CM | POA: Diagnosis not present

## 2018-02-11 NOTE — Patient Instructions (Signed)
Medication Instructions: Your physician recommends that you continue on your current medications as directed. Please refer to the Current Medication list given to you today.   Labwork: None  Procedures/Testing: None  Follow-Up: Your physician wants you to follow-up in: 9 months with Dr. Vilinda Boehringer will receive a reminder letter in the mail two months in advance. If you don't receive a letter, please call our office to schedule the follow-up appointment.   Any Additional Special Instructions Will Be Listed Below (If Applicable).     If you need a refill on your cardiac medications before your next appointment, please call your pharmacy.

## 2018-02-11 NOTE — Progress Notes (Signed)
Cardiology Office Note:    Date:  02/11/2018   ID:  Destiny Andersen, DOB May 11, 1946, MRN [id number]  PCP:  Darcus Austin, MD  Cardiologist:  Mertie Moores, MD  Referring MD: Darcus Austin, MD   Chief Complaint  Patient presents with  . Follow-up    s/p mitral valve repair    History of Present Illness:    Destiny Andersen is a 72 y.o. female with a past medical history significant for mitral valve prolapse with severe MR s/p minimally invasive mitral valve repair and PFO closure 01/2016. Cath prior to her surgery shoed no obstructive CAD. Most recent echo 02/14/2017 showed normal LV function, EF 55-60%, good MV functioning.   She was last seen 02/15/2017 by Dr. Acie Fredrickson at which time she was hypotensive with borderline low blood pressure.  Heart rate was well controlled on metoprolol.  She was advised to increase her salt intake with drinking V8 juice every day.  She was tolerating a low blood pressure fairly well.  Her [name] was reluctant to decrease her metoprolol at the time but suggested we could consider cutting it back if needed.  [name] is here today alone for follow up. She is upset that she did not receive a letter to remind her to follow up at 6 months. She is no longer dizzy. She has been eating more salt but not drinking the V8 juice any longer. She has a problem with her left knee with pain and is unable to walk for exercise. She is undergoing PT. She walks her dog slowly for about a mile every day. She gets about 6,000-7,000 steps per day. She goes to the gym with a trainer and uses recumbent bike and rowing machine, also with some light wt training. She lives with her husband and does all of her housework.   She has had no chest pain/pressure/tightness or shortness of breath with activities. No orthopnea, PND or edema. No palpitations, lightheadedness or syncope. Overall, except for the knee pain, she is feeling very well.   Past Medical History:  Diagnosis Date  . Cancer  [facility]) 1983   Cervical cancer.  Skin Cancer- leg  . Chronic headaches    "? Migraines"- does not have then any more  . Constipation   . Elevated C-reactive protein (CRP)   . Family history of breast cancer    Mother  . Fatigue   . Heart murmur    ECHO 02/10/13 EF = 60-65%, moderate posterior mitral valve prolapse w/possible flail segment, severe MR, mitral regurgitant jet is anteriorly directed.  Marland Kitchen History of blood transfusion    pre-eclampsia   . Hyperpotassemia    Migrated  . Medicare annual wellness visit, subsequent   . Mitral valve prolapse   . MR (mitral regurgitation)    ECHO 02/10/13 EF = 60-65%, moderate posterior mitral valve prolapse w/possible flail segment, severe MR, mitral regurgitant jet is anteriorly directed.  . Risk for falls   . Routine general medical examination at a health care facility   . S/P minimally invasive mitral valve repair 02/01/2016   Complex valvuloplasty including triangular resection of flail posterior leaflet, artificial Gore-tex neochord placement x10 and 28 mm Sorin Memo 3D ring annuloplasty via right mini thoracotomy approach  . Seizures ([place])    with pre- echa  . Shortness of breath dyspnea    with exertion    Past Surgical History:  Procedure Laterality Date  . BREAST BIOPSY Right 1998   benign cyst  .  CARDIAC CATHETERIZATION N/A 12/21/2015   Procedure: Right/Left Heart Cath and Coronary Angiography;  Surgeon: Sherren Mocha, MD;  Location: [place] [facility];  Service: Cardiovascular;  Laterality: N/A;  . COLONOSCOPY  08/2001   Colon cancer screening in 01/2012  . LAPAROSCOPIC CHOLECYSTECTOMY  1990  . MITRAL VALVE REPAIR Right 02/01/2016   Procedure: MINIMALLY INVASIVE MITRAL VALVE REPAIR (MVR) with closure of PFO;  Surgeon: Rexene Alberts, MD;  Location: [place];  Service: Open Heart Surgery;  Laterality: Right;  . TEE WITHOUT CARDIOVERSION N/A 10/14/2015   Procedure: TRANSESOPHAGEAL ECHOCARDIOGRAM (TEE);  Surgeon: Thayer Headings, MD;   Location: [place];  Service: Cardiovascular;  Laterality: N/A;  . TEE WITHOUT CARDIOVERSION N/A 02/01/2016   Procedure: TRANSESOPHAGEAL ECHOCARDIOGRAM (TEE);  Surgeon: Rexene Alberts, MD;  Location: [place];  Service: Open Heart Surgery;  Laterality: N/A;  . TOTAL ABDOMINAL HYSTERECTOMY  1983   Cervical cancer 1983    Current Medications: Current Meds  Medication Sig  . aspirin 81 MG tablet Take 81 mg by mouth daily.  . calcium citrate-vitamin D (CITRACAL+D) 315-200 MG-UNIT tablet Take 1 tablet by mouth 3 (three) times daily. VITAMIN D DOSAGE IS 250MG  INSTEAD OF 200  . clindamycin (CLEOCIN) 300 MG capsule TAKE 2 CAPSULES BY MOUTH AS DIRECTED 1 HOUR PRIOR TO DENTAL WORK  . metoprolol tartrate (LOPRESSOR) 25 MG tablet TAKE 1/2 TABLET BY MOUTH EVERY MORNING AND 1 TABLET EVERY EVENING.     Allergies:   Penicillins; Codeine; Doxycycline hyclate; and Ultram [tramadol]   Social History   Socioeconomic History  . Marital status: Married    Spouse name: Not on file  . Number of children: 1  . Years of education: Not on file  . Highest education level: Not on file  Occupational History  . Not on file  Social Needs  . Financial resource strain: Not on file  . Food insecurity:    Worry: Not on file    Inability: Not on file  . Transportation needs:    Medical: Not on file    Non-medical: Not on file  Tobacco Use  . Smoking status: Former Smoker    Years: 25.00    Types: Cigarettes    Last attempt to quit: 06/18/1985    Years since quitting: 32.6  . Smokeless tobacco: Never Used  Substance and Sexual Activity  . Alcohol use: Yes    Alcohol/week: 2.0 standard drinks    Types: 2 Standard drinks or equivalent per week    Comment: rare  . Drug use: No  . Sexual activity: Never  Lifestyle  . Physical activity:    Days per week: Not on file    Minutes per session: Not on file  . Stress: Not on file  Relationships  . Social connections:    Talks on phone: Not on file    Gets  together: Not on file    Attends religious service: Not on file    Active member of club or organization: Not on file    Attends meetings of clubs or organizations: Not on file    Relationship status: Not on file  Other Topics Concern  . Not on file  Social History Narrative  . Not on file     Family History: The patient's family history includes AAA (abdominal aortic aneurysm) in her father; Breast cancer in her mother; CAD in her father and mother; Heart disease in her father and mother; High Cholesterol in her brother; Hypertension in her brother,  father, and mother; Skin cancer in her brother. ROS:   Please see the history of present illness.     All other systems reviewed and are negative.  EKGs/Labs/Other Studies Reviewed:    The following studies were reviewed today:  Echocardiogram 02/14/2018 Study Conclusions  - Left ventricle: The cavity size was normal. Wall thickness was   normal. Systolic function was normal. The estimated ejection   fraction was in the range of 55% to 60%. Wall motion was normal;   there were no regional wall motion abnormalities. - Mitral valve: Prior procedures included surgical repair. Valve   area by pressure half-time: 1.75 cm^2. Valve area by continuity   equation (using LVOT flow): 1.75 cm^2.   EKG:  EKG is  ordered today.  The ekg ordered today demonstrates NSR, 68 bpm.  Recent Labs: No results found for requested labs within last 8760 hours.   Recent Lipid Panel No results found for: CHOL, TRIG, HDL, CHOLHDL, VLDL, LDLCALC, LDLDIRECT  Physical Exam:    VS:  BP 120/74   Pulse 68   Ht 5\' 4"  (1.626 m)   Wt 132 lb 3.2 oz (60 kg)   SpO2 96%   BMI 22.69 kg/m     Wt Readings from Last 3 Encounters:  02/11/18 132 lb 3.2 oz (60 kg)  01/13/18 132 lb (59.9 kg)  12/23/17 130 lb (59 kg)     Physical Exam  Constitutional: She is oriented to person, place, and time. She appears well-developed and well-nourished. No distress.  HENT:    Head: Normocephalic and atraumatic.  Neck: Normal range of motion. Neck supple. No JVD present.  Cardiovascular: Normal rate, regular rhythm, normal heart sounds and intact distal pulses. Exam reveals no gallop and no friction rub.  No murmur heard. Pulmonary/Chest: Effort normal and breath sounds normal. No respiratory distress. She has no wheezes. She has no rales.  Abdominal: Soft. Bowel sounds are normal. She exhibits no distension.  Musculoskeletal: Normal range of motion. She exhibits no edema.  Wearing knee support on left knee.   Neurological: She is alert and oriented to person, place, and time.  Skin: Skin is warm and dry.  Psychiatric: She has a normal mood and affect. Her behavior is normal. Judgment and thought content normal.  Vitals reviewed.    ASSESSMENT:    1. History of mitral valve repair    PLAN:    In order of problems listed above:  s/p minimally invasive mitral valve repair and PFO closure 01/2016: Doing well. Tolerating BB. Trying to exercise but currently limited due to knee pain. Echo in 01/2017 showed normal valve functioning. No murmur. Since she is doing well, will stretch follow up to 9 months with Dr. Acie Fredrickson.   Hypotension: pt was hypotensive at last appt with some dizziness. Now resolved and no longer having dizziness. She has loosened up her salt restriction, but still not eat a lot of salt.    Medication Adjustments/Labs and Tests Ordered: Current medicines are reviewed at length with the patient today.  Concerns regarding medicines are outlined above. Labs and tests ordered and medication changes are outlined in the patient instructions below:  Patient Instructions  Medication Instructions: Your physician recommends that you continue on your current medications as directed. Please refer to the Current Medication list given to you today.   Labwork: None  Procedures/Testing: None  Follow-Up: Your physician wants you to follow-up in: 9 months  with Dr. Vilinda Boehringer will receive a reminder letter in the  mail two months in advance. If you don't receive a letter, please call our office to schedule the follow-up appointment.   Any Additional Special Instructions Will Be Listed Below (If Applicable).     If you need a refill on your cardiac medications before your next appointment, please call your pharmacy.      Signed, Daune Perch, NP  02/11/2018 2:13 PM    [facility]

## 2018-02-11 NOTE — Therapy (Signed)
[place], [place], [address] Phone: [phone]   Fax:  [phone]  Physical Therapy Treatment  Patient Details  Name: Destiny Andersen MRN: [id number] Date of Birth: 1946-05-21 Referring Provider: Renata Caprice   Encounter Date: 02/11/2018  PT End of Session - 02/11/18 1751    Visit Number  2    Number of Visits  12    Date for PT Re-Evaluation  03/12/18    PT Start Time  1503    PT Stop Time  1605    PT Time Calculation (min)  62 min    Activity Tolerance  Patient tolerated treatment well    Behavior During Therapy  [facility] for tasks assessed/performed       Past Medical History:  Diagnosis Date  . Cancer [facility]) 1983   Cervical cancer.  Skin Cancer- leg  . Chronic headaches    "? Migraines"- does not have then any more  . Constipation   . Elevated C-reactive protein (CRP)   . Family history of breast cancer    Mother  . Fatigue   . Heart murmur    ECHO 02/10/13 EF = 60-65%, moderate posterior mitral valve prolapse w/possible flail segment, severe MR, mitral regurgitant jet is anteriorly directed.  Marland Kitchen History of blood transfusion    pre-eclampsia   . Hyperpotassemia    Migrated  . Medicare annual wellness visit, subsequent   . Mitral valve prolapse   . MR (mitral regurgitation)    ECHO 02/10/13 EF = 60-65%, moderate posterior mitral valve prolapse w/possible flail segment, severe MR, mitral regurgitant jet is anteriorly directed.  . Risk for falls   . Routine general medical examination at a health care facility   . S/P minimally invasive mitral valve repair 02/01/2016   Complex valvuloplasty including triangular resection of flail posterior leaflet, artificial Gore-tex neochord placement x10 and 28 mm Sorin Memo 3D ring annuloplasty via right mini thoracotomy approach  . Seizures ([place])    with pre- echa  . Shortness of breath dyspnea    with exertion    Past Surgical History:  Procedure  Laterality Date  . BREAST BIOPSY Right 1998   benign cyst  . CARDIAC CATHETERIZATION N/A 12/21/2015   Procedure: Right/Left Heart Cath and Coronary Angiography;  Surgeon: Sherren Mocha, MD;  Location: [place] [facility];  Service: Cardiovascular;  Laterality: N/A;  . COLONOSCOPY  08/2001   Colon cancer screening in 01/2012  . LAPAROSCOPIC CHOLECYSTECTOMY  1990  . MITRAL VALVE REPAIR Right 02/01/2016   Procedure: MINIMALLY INVASIVE MITRAL VALVE REPAIR (MVR) with closure of PFO;  Surgeon: Rexene Alberts, MD;  Location: [place];  Service: Open Heart Surgery;  Laterality: Right;  . TEE WITHOUT CARDIOVERSION N/A 10/14/2015   Procedure: TRANSESOPHAGEAL ECHOCARDIOGRAM (TEE);  Surgeon: Thayer Headings, MD;  Location: [place];  Service: Cardiovascular;  Laterality: N/A;  . TEE WITHOUT CARDIOVERSION N/A 02/01/2016   Procedure: TRANSESOPHAGEAL ECHOCARDIOGRAM (TEE);  Surgeon: Rexene Alberts, MD;  Location: [place];  Service: Open Heart Surgery;  Laterality: N/A;  . TOTAL ABDOMINAL HYSTERECTOMY  1983   Cervical cancer 1983    There were no vitals filed for this visit.  Subjective Assessment - 02/11/18 1507    Subjective  Pt arrives, antalgic gait, wearing knee brace.  Wears it all day.  Takes off when she naps or ices.  I have seen the trainer today. The stretches have helped, the ITB especially. IT got better after the  initial injury and then was bad since March 31st.  Carried paint up stairs.     Currently in Pain?  Yes    Pain Score  5     Pain Location  Knee    Pain Orientation  Left;Posterior;Lateral    Pain Descriptors / Indicators  Tightness    Pain Type  Chronic pain    Pain Onset  More than a month ago    Pain Frequency  Intermittent    Aggravating Factors   knee ROM, walking, stairs          [facility] PT Assessment - 02/11/18 0001      AROM   Left Knee Flexion  126       [facility] Adult PT Treatment/Exercise - 02/11/18 0001      Knee/Hip Exercises: Stretches   Active Hamstring Stretch   Left;3 reps;30 seconds    Quad Stretch  Left;4 reps    Knee: Self-Stretch to increase Flexion  Left;5 reps;10 seconds    ITB Stretch  Left;3 reps;30 seconds    Gastroc Stretch  Both;2 reps    Soleus Stretch  Both;3 reps;30 seconds      Knee/Hip Exercises: Aerobic   Stationary Bike  5 min L2 warm up       Knee/Hip Exercises: Supine   Quad Sets  Strengthening;Both;1 set;5 reps    Straight Leg Raise with External Rotation  Strengthening;Both;1 set;20 reps    Straight Leg Raise with External Rotation Limitations  notably easier Rt LE    Patellar Mobs  gentle, painful       Moist Heat Therapy   Number Minutes Moist Heat  15 Minutes    Moist Heat Location  Knee      Electrical Stimulation   Electrical Stimulation Location  Rt posterior knee    Electrical Stimulation Action  IFC    Electrical Stimulation Parameters  12    Electrical Stimulation Goals  Pain      Manual Therapy   Soft tissue mobilization  STM/IASTM to hamstring, ITB, quads . Pin and stretch L hamstring              PT Education - 02/11/18 1750    Education Details  HEP tips, technique, anatomy, TENS, manual trigger point release     Person(s) Educated  Patient    Methods  Explanation;Demonstration;Verbal cues    Comprehension  Verbalized understanding;Returned demonstration          PT Long Term Goals - 01/29/18 1801      PT LONG TERM GOAL #1   Title  Pt will be I and compliant with HEP. 6 weeks 03/12/18    Status  New      PT LONG TERM GOAL #2   Title  Pt will increase FOTO to less than 40% limited. 6 weeks 03/12/18    Status  New      PT LONG TERM GOAL #3   Title  Pt will improve Lt LE strength to at least 5-/5 MMT. 6 weeks 03/12/18    Status  New      PT LONG TERM GOAL #4   Title  Pt will improve Lt knee ROM to WNL. 6 weeks 03/12/18    Status  New      PT LONG TERM GOAL #5   Title  Pt will report less than 1-2/10 pain overall with walking and stairs.6 weeks 03/12/18    Status  New  Plan - 02/11/18 1521    Clinical Impression Statement  Pt continues to have pain in lateral hamstring, lateral knee and extending into peroneal attachments.  AROM in knee flexion is improved. She has been stretching appropriately. Area just distal to fibular head was very painful, she jumped when palpated.  She has a poor quad set.  Modalities for pain.  Will f/u Thursday.     PT Treatment/Interventions  ADLs/Self Care Home Management;Cryotherapy;Electrical Stimulation;Iontophoresis 4mg /ml Dexamethasone;Moist Heat;Ultrasound;Therapeutic exercise;Therapeutic activities;Neuromuscular re-education;Manual techniques;Passive range of motion;Dry needling;Taping    PT Next Visit Plan  review HEP, HS stretching and LE strengthening, MT and modalites, consider tape/ionto/us    PT Home Exercise Plan  hamstring, ITB, quad, bridge, quad set, SLR     Consulted and Agree with Plan of Care  Patient       Patient will benefit from skilled therapeutic intervention in order to improve the following deficits and impairments:  Abnormal gait, Decreased activity tolerance, Decreased range of motion, Decreased strength, Impaired flexibility, Increased fascial restricitons, Pain  Visit Diagnosis: Chronic pain of left knee     Problem List Patient Active Problem List   Diagnosis Date Noted  . Hypotension 02/15/2017  . Long term (current) use of anticoagulants [Z79.01] 02/09/2016  . History of mitral valve repair 02/01/2016    Destiny Andersen 02/11/2018, 6:22 PM  [facility] [address] [place], [place], [address] Phone: [phone]   Fax:  [phone]  Name: Destiny Andersen MRN: [id number] Date of Birth: 1946/05/08   Raeford Razor, PT 02/11/18 6:22 PM Phone: [phone] Fax: [phone]

## 2018-02-14 ENCOUNTER — Encounter: Payer: Self-pay | Admitting: Physical Therapy

## 2018-02-14 ENCOUNTER — Ambulatory Visit: Payer: Medicare Other | Admitting: Physical Therapy

## 2018-02-14 DIAGNOSIS — M25562 Pain in left knee: Secondary | ICD-10-CM | POA: Diagnosis not present

## 2018-02-14 DIAGNOSIS — G8929 Other chronic pain: Secondary | ICD-10-CM

## 2018-02-14 NOTE — Therapy (Signed)
[place], [place], [address] Phone: [phone]   Fax:  [phone]  Physical Therapy Treatment  Patient Details  Name: Destiny Andersen MRN: [id number] Date of Birth: 06/27/1945 Referring Provider: Renata Caprice   Encounter Date: 02/14/2018  PT End of Session - 02/14/18 1137    Visit Number  3    Number of Visits  12    Date for PT Re-Evaluation  03/12/18    PT Start Time  0930    PT Stop Time  1025    PT Time Calculation (min)  55 min    Activity Tolerance  Patient tolerated treatment well    Behavior During Therapy  [facility] for tasks assessed/performed       Past Medical History:  Diagnosis Date  . Cancer [facility]) 1983   Cervical cancer.  Skin Cancer- leg  . Chronic headaches    "? Migraines"- does not have then any more  . Constipation   . Elevated C-reactive protein (CRP)   . Family history of breast cancer    Mother  . Fatigue   . Heart murmur    ECHO 02/10/13 EF = 60-65%, moderate posterior mitral valve prolapse w/possible flail segment, severe MR, mitral regurgitant jet is anteriorly directed.  Marland Kitchen History of blood transfusion    pre-eclampsia   . Hyperpotassemia    Migrated  . Medicare annual wellness visit, subsequent   . Mitral valve prolapse   . MR (mitral regurgitation)    ECHO 02/10/13 EF = 60-65%, moderate posterior mitral valve prolapse w/possible flail segment, severe MR, mitral regurgitant jet is anteriorly directed.  . Risk for falls   . Routine general medical examination at a health care facility   . S/P minimally invasive mitral valve repair 02/01/2016   Complex valvuloplasty including triangular resection of flail posterior leaflet, artificial Gore-tex neochord placement x10 and 28 mm Sorin Memo 3D ring annuloplasty via right mini thoracotomy approach  . Seizures ([place])    with pre- echa  . Shortness of breath dyspnea    with exertion    Past Surgical History:  Procedure  Laterality Date  . BREAST BIOPSY Right 1998   benign cyst  . CARDIAC CATHETERIZATION N/A 12/21/2015   Procedure: Right/Left Heart Cath and Coronary Angiography;  Surgeon: Sherren Mocha, MD;  Location: [place] [facility];  Service: Cardiovascular;  Laterality: N/A;  . COLONOSCOPY  08/2001   Colon cancer screening in 01/2012  . LAPAROSCOPIC CHOLECYSTECTOMY  1990  . MITRAL VALVE REPAIR Right 02/01/2016   Procedure: MINIMALLY INVASIVE MITRAL VALVE REPAIR (MVR) with closure of PFO;  Surgeon: Rexene Alberts, MD;  Location: [place];  Service: Open Heart Surgery;  Laterality: Right;  . TEE WITHOUT CARDIOVERSION N/A 10/14/2015   Procedure: TRANSESOPHAGEAL ECHOCARDIOGRAM (TEE);  Surgeon: Thayer Headings, MD;  Location: [place];  Service: Cardiovascular;  Laterality: N/A;  . TEE WITHOUT CARDIOVERSION N/A 02/01/2016   Procedure: TRANSESOPHAGEAL ECHOCARDIOGRAM (TEE);  Surgeon: Rexene Alberts, MD;  Location: [place];  Service: Open Heart Surgery;  Laterality: N/A;  . TOTAL ABDOMINAL HYSTERECTOMY  1983   Cervical cancer 1983    There were no vitals filed for this visit.  Subjective Assessment - 02/14/18 0936    Subjective  Pt has been doing exercises everyday.  She did have a spell last night when she was able to walk without pain and that is something new.     Currently in Pain?  Yes  Pain Score  6     Pain Location  Knee    Pain Orientation  Left;Posterior;Lateral    Pain Descriptors / Indicators  Aching    Pain Type  Chronic pain    Pain Onset  More than a month ago    Pain Frequency  Intermittent    Aggravating Factors   excessive activtiy, bending, stairs     Pain Relieving Factors  exercises, ginger, MHP, TENS     Multiple Pain Sites  No         [facility] PT Assessment - 02/14/18 0001      Strength   Strength Assessment Site  Ankle    Right/Left Ankle  Left    Left Ankle Dorsiflexion  5/5    Left Ankle Plantar Flexion  4/5    Left Ankle Inversion  4/5   pain    Left Ankle Eversion   4/5   pain          [facility] Adult PT Treatment/Exercise - 02/14/18 0001      Knee/Hip Exercises: Stretches   Active Hamstring Stretch  Left;3 reps;30 seconds      Knee/Hip Exercises: Aerobic   Stationary Bike  5 min L2 warm up       Knee/Hip Exercises: Supine   Quad Sets  Strengthening;Both;1 set;20 reps    Bridges with [employer]  Strengthening;1 set;15 reps    Single Leg Bridge  Strengthening;Both;1 set;10 reps    Straight Leg Raise with External Rotation  Strengthening;Left;2 sets;15 reps      Moist Heat Therapy   Number Minutes Moist Heat  10 Minutes    Moist Heat Location  Knee      Manual Therapy   Manual Therapy  Joint mobilization;Passive ROM;Taping    Joint Mobilization  gentle posterior fibular mobs with movement     Passive ROM  ankle all planes     Kinesiotex  Create Space;Inhibit Muscle;Ligament Correction      Kinesiotix   Inhibit Muscle   hamstrings (biceps  femoris)     Ligament Correction  pulled fibular head medially    80 % pull , off tape ends of tape  (about 4 inch I strip Roc     Ankle Exercises: Seated   Other Seated Ankle Exercises  ankle eversion x 10 green band x and inversion x 10                   PT Long Term Goals - 01/29/18 1801      PT LONG TERM GOAL #1   Title  Pt will be I and compliant with HEP. 6 weeks 03/12/18    Status  New      PT LONG TERM GOAL #2   Title  Pt will increase FOTO to less than 40% limited. 6 weeks 03/12/18    Status  New      PT LONG TERM GOAL #3   Title  Pt will improve Lt LE strength to at least 5-/5 MMT. 6 weeks 03/12/18    Status  New      PT LONG TERM GOAL #4   Title  Pt will improve Lt knee ROM to WNL. 6 weeks 03/12/18    Status  New      PT LONG TERM GOAL #5   Title  Pt will report less than 1-2/10 pain overall with walking and stairs.6 weeks 03/12/18    Status  New  Plan - 02/14/18 1123    Clinical Impression Statement  Patient with distinct pain in L fibular head  today, weak in eversion and painful inversion.  Standing plantarflexion is weak on L . Pain with gentle weight acceptance on step .  Wil focuse treatment on balancing proper ankle and foot mechanics.  Trial of tape.     PT Treatment/Interventions  ADLs/Self Care Home Management;Cryotherapy;Electrical Stimulation;Iontophoresis 4mg /ml Dexamethasone;Moist Heat;Ultrasound;Therapeutic exercise;Therapeutic activities;Neuromuscular re-education;Manual techniques;Passive range of motion;Dry needling;Taping    PT Next Visit Plan  review HEP, HS stretching and LE strengthening, MT and modalites, consider tape/ionto/us    PT Home Exercise Plan  hamstring, ITB, quad, bridge, quad set, SLR , ankle Theraband eversion/inversion (give copy)_     Consulted and Agree with Plan of Care  Patient       Patient will benefit from skilled therapeutic intervention in order to improve the following deficits and impairments:  Abnormal gait, Decreased activity tolerance, Decreased range of motion, Decreased strength, Impaired flexibility, Increased fascial restricitons, Pain  Visit Diagnosis: Chronic pain of left knee     Problem List Patient Active Problem List   Diagnosis Date Noted  . Hypotension 02/15/2017  . Long term (current) use of anticoagulants [Z79.01] 02/09/2016  . History of mitral valve repair 02/01/2016    Dajiah Kooi 02/14/2018, 11:38 AM  [facility] [address] [place], [place], [address] Phone: [phone]   Fax:  [phone]  Name: Destiny Andersen MRN: [id number] Date of Birth: Jan 31, 1946  Raeford Razor, PT 02/14/18 11:38 AM Phone: [phone] Fax: [phone]

## 2018-02-18 ENCOUNTER — Encounter: Payer: Self-pay | Admitting: Physical Therapy

## 2018-02-18 ENCOUNTER — Ambulatory Visit: Payer: Medicare Other | Attending: Sports Medicine | Admitting: Physical Therapy

## 2018-02-18 DIAGNOSIS — G8929 Other chronic pain: Secondary | ICD-10-CM | POA: Diagnosis present

## 2018-02-18 DIAGNOSIS — M25562 Pain in left knee: Secondary | ICD-10-CM | POA: Diagnosis present

## 2018-02-18 NOTE — Therapy (Signed)
[place], [place], [address] Phone: [phone]   Fax:  [phone]  Physical Therapy Treatment  Patient Details  Name: Destiny Andersen MRN: [id number] Date of Birth: 1946/01/14 Referring Provider: Renata Caprice   Encounter Date: 02/18/2018  PT End of Session - 02/18/18 1431    Visit Number  4    Number of Visits  12    Date for PT Re-Evaluation  03/12/18    PT Start Time  2703    PT Stop Time  1505    PT Time Calculation (min)  49 min    Activity Tolerance  Patient tolerated treatment well    Behavior During Therapy  [facility] for tasks assessed/performed       Past Medical History:  Diagnosis Date  . Cancer [facility]) 1983   Cervical cancer.  Skin Cancer- leg  . Chronic headaches    "? Migraines"- does not have then any more  . Constipation   . Elevated C-reactive protein (CRP)   . Family history of breast cancer    Mother  . Fatigue   . Heart murmur    ECHO 02/10/13 EF = 60-65%, moderate posterior mitral valve prolapse w/possible flail segment, severe MR, mitral regurgitant jet is anteriorly directed.  Marland Kitchen History of blood transfusion    pre-eclampsia   . Hyperpotassemia    Migrated  . Medicare annual wellness visit, subsequent   . Mitral valve prolapse   . MR (mitral regurgitation)    ECHO 02/10/13 EF = 60-65%, moderate posterior mitral valve prolapse w/possible flail segment, severe MR, mitral regurgitant jet is anteriorly directed.  . Risk for falls   . Routine general medical examination at a health care facility   . S/P minimally invasive mitral valve repair 02/01/2016   Complex valvuloplasty including triangular resection of flail posterior leaflet, artificial Gore-tex neochord placement x10 and 28 mm Sorin Memo 3D ring annuloplasty via right Andersen thoracotomy approach  . Seizures ([place])    with pre- echa  . Shortness of breath dyspnea    with exertion    Past Surgical History:  Procedure Laterality  Date  . BREAST BIOPSY Right 1998   benign cyst  . CARDIAC CATHETERIZATION N/A 12/21/2015   Procedure: Right/Left Heart Cath and Coronary Angiography;  Surgeon: Sherren Mocha, MD;  Location: [place] [facility];  Service: Cardiovascular;  Laterality: N/A;  . COLONOSCOPY  08/2001   Colon cancer screening in 01/2012  . LAPAROSCOPIC CHOLECYSTECTOMY  1990  . MITRAL VALVE REPAIR Right 02/01/2016   Procedure: MINIMALLY INVASIVE MITRAL VALVE REPAIR (MVR) with closure of PFO;  Surgeon: Rexene Alberts, MD;  Location: [place];  Service: Open Heart Surgery;  Laterality: Right;  . TEE WITHOUT CARDIOVERSION N/A 10/14/2015   Procedure: TRANSESOPHAGEAL ECHOCARDIOGRAM (TEE);  Surgeon: Thayer Headings, MD;  Location: [place];  Service: Cardiovascular;  Laterality: N/A;  . TEE WITHOUT CARDIOVERSION N/A 02/01/2016   Procedure: TRANSESOPHAGEAL ECHOCARDIOGRAM (TEE);  Surgeon: Rexene Alberts, MD;  Location: [place];  Service: Open Heart Surgery;  Laterality: N/A;  . TOTAL ABDOMINAL HYSTERECTOMY  1983   Cervical cancer 1983    There were no vitals filed for this visit.  Subjective Assessment - 02/18/18 1420    Subjective  Tape did not help much.  No pain right now but I took 2 Tylenols.     Currently in Pain?  No/denies         [facility] Adult PT Treatment/Exercise - 02/18/18 0001  Self-Care   Self-Care  Other Self-Care Comments    Other Self-Care Comments   safety at the gym, Pilates Reformer and benefits, core and pelvic stabilization , [place], anatomy of knee    where to place electrodes for Home tENS      Pilates   Pilates Reformer  see note       Ultrasound   Ultrasound Location  R biceps femoris and prox lateral knee     Ultrasound Parameters  20% , 1 .0 W/cm2, 1 mHz     Ultrasound Goals  Pain   used biofreeze        Pilates Reformer used for LE/core strength, postural strength, lumbopelvic disassociation and core control.  Exercises included: Footwork 2 Red 1 blue   Parallel, turnout, wide  turnout(less pain when L hip ER), pain mid range   Heels, toes, wide   Heel raise and calf stretch   Single leg work done on 2 red 1 yellow, cues for alignment.    Bridging 3 springs cues for technique and articulation.  No pain or hamstring cramping.       PT Long Term Goals - 02/18/18 1540      PT LONG TERM GOAL #1   Title  Pt will be I and compliant with HEP. 6 weeks 03/12/18    Status  On-going      PT LONG TERM GOAL #2   Title  Pt will increase FOTO to less than 40% limited. 6 weeks 03/12/18    Status  Unable to assess      PT LONG TERM GOAL #3   Title  Pt will improve Lt LE strength to at least 5-/5 MMT. 6 weeks 03/12/18    Status  On-going      PT LONG TERM GOAL #4   Title  Pt will improve Lt knee ROM to WNL. 6 weeks 03/12/18    Status  On-going      PT LONG TERM GOAL #5   Title  Pt will report less than 1-2/10 pain overall with walking and stairs.6 weeks 03/12/18    Status  On-going            Plan - 02/18/18 1540    Clinical Impression Statement  End of session patient able to sit with legs extended and near equal extension. Spent time educating on PIlates, core and anatomy of her L knee, MOI, fibular head.  Pilates was a good way for her to challenge her knee and minimize pain.      PT Treatment/Interventions  ADLs/Self Care Home Management;Cryotherapy;Electrical Stimulation;Iontophoresis 4mg /ml Dexamethasone;Moist Heat;Ultrasound;Therapeutic exercise;Therapeutic activities;Neuromuscular re-education;Manual techniques;Passive range of motion;Dry needling;Taping    PT Next Visit Plan  try reformer again? Feet in straps.  Repeat US if she liked it.     PT Home Exercise Plan  hamstring, ITB, quad, bridge, quad set, SLR , ankle Theraband eversion/inversion (give copy)_     Consulted and Agree with Plan of Care  Patient       Patient will benefit from skilled therapeutic intervention in order to improve the following deficits and impairments:  Abnormal gait, Decreased  activity tolerance, Decreased range of motion, Decreased strength, Impaired flexibility, Increased fascial restricitons, Pain  Visit Diagnosis: Chronic pain of left knee     Problem List Patient Active Problem List   Diagnosis Date Noted  . Hypotension 02/15/2017  . Long term (current) use of anticoagulants [Z79.01] 02/09/2016  . History of mitral valve repair 02/01/2016    PAA,JENNIFER  02/18/2018, 3:49 PM  [place], [place], [address] Phone: [phone]   Fax:  [phone]  Name: Destiny Andersen MRN: [id number] Date of Birth: 1946/05/21  Raeford Razor, PT 02/18/18 3:50 PM Phone: [phone] Fax: [phone]

## 2018-02-25 ENCOUNTER — Ambulatory Visit: Payer: Medicare Other | Admitting: Physical Therapy

## 2018-02-25 ENCOUNTER — Encounter: Payer: Self-pay | Admitting: Physical Therapy

## 2018-02-25 DIAGNOSIS — G8929 Other chronic pain: Secondary | ICD-10-CM

## 2018-02-25 DIAGNOSIS — M25562 Pain in left knee: Secondary | ICD-10-CM | POA: Diagnosis not present

## 2018-02-25 NOTE — Therapy (Signed)
[place], [place], [address] Phone: [phone]   Fax:  [phone]  Physical Therapy Treatment  Patient Details  Name: Destiny Andersen MRN: [id number] Date of Birth: [birth year] Referring Provider: Renata Caprice   Encounter Date: 02/25/2018  PT End of Session - 02/25/18 1421    Visit Number  5    Number of Visits  12    Date for PT Re-Evaluation  03/12/18    PT Start Time  1419    Activity Tolerance  Patient tolerated treatment well    Behavior During Therapy  [facility] for tasks assessed/performed       Past Medical History:  Diagnosis Date  . Cancer [facility]) 1983   Cervical cancer.  Skin Cancer- leg  . Chronic headaches    "? Migraines"- does not have then any more  . Constipation   . Elevated C-reactive protein (CRP)   . Family history of breast cancer    Mother  . Fatigue   . Heart murmur    ECHO 02/10/13 EF = 60-65%, moderate posterior mitral valve prolapse w/possible flail segment, severe MR, mitral regurgitant jet is anteriorly directed.  Marland Kitchen History of blood transfusion    pre-eclampsia   . Hyperpotassemia    Migrated  . Medicare annual wellness visit, subsequent   . Mitral valve prolapse   . MR (mitral regurgitation)    ECHO 02/10/13 EF = 60-65%, moderate posterior mitral valve prolapse w/possible flail segment, severe MR, mitral regurgitant jet is anteriorly directed.  . Risk for falls   . Routine general medical examination at a health care facility   . S/P minimally invasive mitral valve repair 02/01/2016   Complex valvuloplasty including triangular resection of flail posterior leaflet, artificial Gore-tex neochord placement x10 and 28 mm Sorin Memo 3D ring annuloplasty via right mini thoracotomy approach  . Seizures ([place])    with pre- echa  . Shortness of breath dyspnea    with exertion    Past Surgical History:  Procedure Laterality Date  . BREAST BIOPSY Right 1998   benign cyst  .  CARDIAC CATHETERIZATION N/A 12/21/2015   Procedure: Right/Left Heart Cath and Coronary Angiography;  Surgeon: Sherren Mocha, MD;  Location: [place] [facility];  Service: Cardiovascular;  Laterality: N/A;  . COLONOSCOPY  08/2001   Colon cancer screening in 01/2012  . LAPAROSCOPIC CHOLECYSTECTOMY  1990  . MITRAL VALVE REPAIR Right 02/01/2016   Procedure: MINIMALLY INVASIVE MITRAL VALVE REPAIR (MVR) with closure of PFO;  Surgeon: Rexene Alberts, MD;  Location: [place];  Service: Open Heart Surgery;  Laterality: Right;  . TEE WITHOUT CARDIOVERSION N/A 10/14/2015   Procedure: TRANSESOPHAGEAL ECHOCARDIOGRAM (TEE);  Surgeon: Thayer Headings, MD;  Location: [place];  Service: Cardiovascular;  Laterality: N/A;  . TEE WITHOUT CARDIOVERSION N/A 02/01/2016   Procedure: TRANSESOPHAGEAL ECHOCARDIOGRAM (TEE);  Surgeon: Rexene Alberts, MD;  Location: [place];  Service: Open Heart Surgery;  Laterality: N/A;  . TOTAL ABDOMINAL HYSTERECTOMY  1983   Cervical cancer 1983    There were no vitals filed for this visit.  Subjective Assessment - 02/25/18 1418    Subjective  My knee is better.  It only hurts when I go up and down stairs.  and when I tighten up my leg.  Yesterday my leg hurt with each step but today not at all.  It feels full.      Currently in Pain?  No/denies  [facility] PT Assessment - 02/25/18 0001      Strength   Right Hip ABduction  4+/5    Right Hip ADduction  4-/5    Left Hip ABduction  4-/5    Left Hip ADduction  3+/5                   [facility] Adult PT Treatment/Exercise - 02/25/18 0001      Knee/Hip Exercises: Stretches   Active Hamstring Stretch  Left;1 rep;60 seconds    ITB Stretch  Left;1 rep;60 seconds      Knee/Hip Exercises: Standing   Terminal Knee Extension  Strengthening;Left;1 set;10 reps    Terminal Knee Extension Limitations  10 sec ball against wall     Wall Squat  1 set;10 reps;5 seconds    Wall Squat Limitations  ball squeeze     Other Standing  Knee Exercises  isometric mini squat with x 10 heel lifts and x 10 toe taps      Knee/Hip Exercises: Supine   Straight Leg Raise with External Rotation  Strengthening;Left;1 set;15 reps    Straight Leg Raise with External Rotation Limitations  slow descent, 4, lbs       Knee/Hip Exercises: Sidelying   Hip ABduction  Strengthening;Left;1 set;20 reps    Hip ADduction  Strengthening;Both;1 set;20 reps    Clams  x 20 green       Ultrasound   Ultrasound Location  L (last session error) lateral knee, biceps femoris, fibular area     Ultrasound Parameters  20% , 1.2 W/cm2, 1.0 MHz    Ultrasound Goals  Pain   used biofreeze      Ankle Exercises: Seated   Other Seated Ankle Exercises  ankle eversion x 20 green band x and inversion x 20                  PT Long Term Goals - 02/18/18 1540      PT LONG TERM GOAL #1   Title  Pt will be I and compliant with HEP. 6 weeks 03/12/18    Status  On-going      PT LONG TERM GOAL #2   Title  Pt will increase FOTO to less than 40% limited. 6 weeks 03/12/18    Status  Unable to assess      PT LONG TERM GOAL #3   Title  Pt will improve Lt LE strength to at least 5-/5 MMT. 6 weeks 03/12/18    Status  On-going      PT LONG TERM GOAL #4   Title  Pt will improve Lt knee ROM to WNL. 6 weeks 03/12/18    Status  On-going      PT LONG TERM GOAL #5   Title  Pt will report less than 1-2/10 pain overall with walking and stairs.6 weeks 03/12/18    Status  On-going            Plan - 02/25/18 1611    Clinical Impression Statement  Patient's pain is becoming more intermittent.  Still pain and difficulty with stairs especially descending.  Addressed hip weakness.  Making progress slowly.  Trainer works her core, UE and does not try to aggravte knee pain.     PT Treatment/Interventions  ADLs/Self Care Home Management;Cryotherapy;Electrical Stimulation;Iontophoresis 4mg /ml Dexamethasone;Moist Heat;Ultrasound;Therapeutic exercise;Therapeutic  activities;Neuromuscular re-education;Manual techniques;Passive range of motion;Dry needling;Taping    PT Next Visit Plan  try mobilizing fibular head anteriorly, posterior ankle with knee bends,   Repeat  US , work towards step ups/down     PT Home Exercise Plan  hamstring, ITB, quad, bridge, quad set, SLR , ankle Theraband eversion/inversion     Consulted and Agree with Plan of Care  Patient       Patient will benefit from skilled therapeutic intervention in order to improve the following deficits and impairments:  Abnormal gait, Decreased activity tolerance, Decreased range of motion, Decreased strength, Impaired flexibility, Increased fascial restricitons, Pain  Visit Diagnosis: Chronic pain of left knee     Problem List Patient Active Problem List   Diagnosis Date Noted  . Hypotension 02/15/2017  . Long term (current) use of anticoagulants [Z79.01] 02/09/2016  . History of mitral valve repair 02/01/2016    Cambry Spampinato 02/25/2018, 4:21 PM  [facility] [address] [place], [place], [address] Phone: [phone]   Fax:  [phone]  Name: Destiny Andersen MRN: [id number] Date of Birth: 19-Jun-1945  Raeford Razor, PT 02/25/18 4:21 PM Phone: [phone] Fax: [phone]

## 2018-02-27 ENCOUNTER — Encounter: Payer: Self-pay | Admitting: Physical Therapy

## 2018-02-27 ENCOUNTER — Ambulatory Visit: Payer: Medicare Other | Admitting: Physical Therapy

## 2018-02-27 DIAGNOSIS — M25562 Pain in left knee: Secondary | ICD-10-CM | POA: Diagnosis not present

## 2018-02-27 DIAGNOSIS — G8929 Other chronic pain: Secondary | ICD-10-CM

## 2018-02-27 NOTE — Therapy (Signed)
[place], [place], [address] Phone: [phone]   Fax:  [phone]  Physical Therapy Treatment  Patient Details  Name: Destiny Andersen MRN: [id number] Date of Birth: 14-Mar-1946 Referring Provider: Renata Caprice   Encounter Date: 02/27/2018  PT End of Session - 02/27/18 1324    Visit Number  6    Number of Visits  12    Date for PT Re-Evaluation  03/12/18    PT Start Time  1330    PT Stop Time  1428    PT Time Calculation (min)  58 min    Activity Tolerance  Patient tolerated treatment well    Behavior During Therapy  [facility] for tasks assessed/performed       Past Medical History:  Diagnosis Date  . Cancer [facility]) 1983   Cervical cancer.  Skin Cancer- leg  . Chronic headaches    "? Migraines"- does not have then any more  . Constipation   . Elevated C-reactive protein (CRP)   . Family history of breast cancer    Mother  . Fatigue   . Heart murmur    ECHO 02/10/13 EF = 60-65%, moderate posterior mitral valve prolapse w/possible flail segment, severe MR, mitral regurgitant jet is anteriorly directed.  Marland Kitchen History of blood transfusion    pre-eclampsia   . Hyperpotassemia    Migrated  . Medicare annual wellness visit, subsequent   . Mitral valve prolapse   . MR (mitral regurgitation)    ECHO 02/10/13 EF = 60-65%, moderate posterior mitral valve prolapse w/possible flail segment, severe MR, mitral regurgitant jet is anteriorly directed.  . Risk for falls   . Routine general medical examination at a health care facility   . S/P minimally invasive mitral valve repair 02/01/2016   Complex valvuloplasty including triangular resection of flail posterior leaflet, artificial Gore-tex neochord placement x10 and 28 mm Sorin Memo 3D ring annuloplasty via right mini thoracotomy approach  . Seizures ([place])    with pre- echa  . Shortness of breath dyspnea    with exertion    Past Surgical History:  Procedure  Laterality Date  . BREAST BIOPSY Right 1998   benign cyst  . CARDIAC CATHETERIZATION N/A 12/21/2015   Procedure: Right/Left Heart Cath and Coronary Angiography;  Surgeon: Sherren Mocha, MD;  Location: [place] [facility];  Service: Cardiovascular;  Laterality: N/A;  . COLONOSCOPY  08/2001   Colon cancer screening in 01/2012  . LAPAROSCOPIC CHOLECYSTECTOMY  1990  . MITRAL VALVE REPAIR Right 02/01/2016   Procedure: MINIMALLY INVASIVE MITRAL VALVE REPAIR (MVR) with closure of PFO;  Surgeon: Rexene Alberts, MD;  Location: [place];  Service: Open Heart Surgery;  Laterality: Right;  . TEE WITHOUT CARDIOVERSION N/A 10/14/2015   Procedure: TRANSESOPHAGEAL ECHOCARDIOGRAM (TEE);  Surgeon: Thayer Headings, MD;  Location: [place];  Service: Cardiovascular;  Laterality: N/A;  . TEE WITHOUT CARDIOVERSION N/A 02/01/2016   Procedure: TRANSESOPHAGEAL ECHOCARDIOGRAM (TEE);  Surgeon: Rexene Alberts, MD;  Location: [place];  Service: Open Heart Surgery;  Laterality: N/A;  . TOTAL ABDOMINAL HYSTERECTOMY  1983   Cervical cancer 1983    There were no vitals filed for this visit.  Subjective Assessment - 02/27/18 1333    Subjective  Those ankle exercises (eversion) flared it up.  Pt has increased pain today, when I'm walking 5/10-6/10.     Currently in Pain?  Yes    Pain Score  4     Pain Location  Knee    Pain Orientation  Left    Pain Descriptors / Indicators  Burning    Pain Type  Chronic pain    Pain Onset  More than a month ago    Pain Frequency  Intermittent           [facility] Adult PT Treatment/Exercise - 02/27/18 0001      Self-Care   Other Self-Care Comments   anatomy, possible causes of pain      Knee/Hip Exercises: Standing   Hip Abduction  Stengthening;Both;1 set;15 reps    Abduction Limitations  on foam     Functional Squat  1 set;10 reps    Functional Squat Limitations  pain on foam     Rocker Board  4 minutes    Rocker Board Limitations  A/P and M/L stability , single leg ankle  control     SLS with Vectors  semi circle sweeps with LLE in slight flexion, min pain       Cryotherapy   Number Minutes Cryotherapy  8 Minutes    Cryotherapy Location  Knee    Type of Cryotherapy  Ice pack      Manual Therapy   Joint Mobilization  fibular mobilizations proximal pulled anterior, distal pushed posterior    A/P mobilizations   Soft tissue mobilization  anterior tibilais, peroneals, gastrocnemius and hamstrings                  PT Long Term Goals - 02/18/18 1540      PT LONG TERM GOAL #1   Title  Pt will be I and compliant with HEP. 6 weeks 03/12/18    Status  On-going      PT LONG TERM GOAL #2   Title  Pt will increase FOTO to less than 40% limited. 6 weeks 03/12/18    Status  Unable to assess      PT LONG TERM GOAL #3   Title  Pt will improve Lt LE strength to at least 5-/5 MMT. 6 weeks 03/12/18    Status  On-going      PT LONG TERM GOAL #4   Title  Pt will improve Lt knee ROM to WNL. 6 weeks 03/12/18    Status  On-going      PT LONG TERM GOAL #5   Title  Pt will report less than 1-2/10 pain overall with walking and stairs.6 weeks 03/12/18    Status  On-going            Plan - 02/27/18 1325    Clinical Impression Statement  Pt with more pain today.  She thinks it is from the ankle eversion exercise, may be due to crossing her affected leg over to perform the exercise. Offered an alternative. Pain extending along tibia as well and fascial restrictions in anteriolateral lower leg with trigger points.  No pain with walking post session but did have pain with closed chain exericises requiring knee flexion and ankle DF.      PT Treatment/Interventions  ADLs/Self Care Home Management;Cryotherapy;Electrical Stimulation;Iontophoresis 4mg /ml Dexamethasone;Moist Heat;Ultrasound;Therapeutic exercise;Therapeutic activities;Neuromuscular re-education;Manual techniques;Passive range of motion;Dry needling;Taping    PT Next Visit Plan  try mobilizing fibular head  anteriorly, posterior ankle with knee bends,   Repeat US , work towards step ups/down     PT Home Exercise Plan  hamstring, ITB, quad, bridge, quad set, SLR , ankle Theraband eversion/inversion     Consulted and Agree with Plan of Care  Patient  Patient will benefit from skilled therapeutic intervention in order to improve the following deficits and impairments:  Abnormal gait, Decreased activity tolerance, Decreased range of motion, Decreased strength, Impaired flexibility, Increased fascial restricitons, Pain  Visit Diagnosis: Chronic pain of left knee     Problem List Patient Active Problem List   Diagnosis Date Noted  . Hypotension 02/15/2017  . Long term (current) use of anticoagulants [Z79.01] 02/09/2016  . History of mitral valve repair 02/01/2016    Leigh Blas 02/27/2018, 4:09 PM  [facility] [address] [place], [place], [address] Phone: [phone]   Fax:  [phone]  Name: Destiny Andersen MRN: [id number] Date of Birth: 22-Aug-1945  Raeford Razor, PT 02/27/18 4:09 PM Phone: [phone] Fax: [phone]

## 2018-03-04 ENCOUNTER — Ambulatory Visit: Payer: Medicare Other | Admitting: Physical Therapy

## 2018-03-04 ENCOUNTER — Encounter: Payer: Self-pay | Admitting: Physical Therapy

## 2018-03-04 DIAGNOSIS — M25562 Pain in left knee: Principal | ICD-10-CM

## 2018-03-04 DIAGNOSIS — G8929 Other chronic pain: Secondary | ICD-10-CM

## 2018-03-04 NOTE — Therapy (Signed)
[place], [place], [address] Phone: [phone]   Fax:  [phone]  Physical Therapy Treatment  Patient Details  Name: Destiny Andersen MRN: [id number] Date of Birth: 01-11-1946 Referring Provider: Renata Caprice   Encounter Date: 03/04/2018  PT End of Session - 03/04/18 1504    Visit Number  7    Number of Visits  12    Date for PT Re-Evaluation  03/12/18    PT Start Time  8921    PT Stop Time  1513    PT Time Calculation (min)  55 min    Activity Tolerance  Patient tolerated treatment well    Behavior During Therapy  [facility] for tasks assessed/performed       Past Medical History:  Diagnosis Date  . Cancer [facility]) 1983   Cervical cancer.  Skin Cancer- leg  . Chronic headaches    "? Migraines"- does not have then any more  . Constipation   . Elevated C-reactive protein (CRP)   . Family history of breast cancer    Mother  . Fatigue   . Heart murmur    ECHO 02/10/13 EF = 60-65%, moderate posterior mitral valve prolapse w/possible flail segment, severe MR, mitral regurgitant jet is anteriorly directed.  Marland Kitchen History of blood transfusion    pre-eclampsia   . Hyperpotassemia    Migrated  . Medicare annual wellness visit, subsequent   . Mitral valve prolapse   . MR (mitral regurgitation)    ECHO 02/10/13 EF = 60-65%, moderate posterior mitral valve prolapse w/possible flail segment, severe MR, mitral regurgitant jet is anteriorly directed.  . Risk for falls   . Routine general medical examination at a health care facility   . S/P minimally invasive mitral valve repair 02/01/2016   Complex valvuloplasty including triangular resection of flail posterior leaflet, artificial Gore-tex neochord placement x10 and 28 mm Sorin Memo 3D ring annuloplasty via right mini thoracotomy approach  . Seizures ([place])    with pre- echa  . Shortness of breath dyspnea    with exertion    Past Surgical History:  Procedure  Laterality Date  . BREAST BIOPSY Right 1998   benign cyst  . CARDIAC CATHETERIZATION N/A 12/21/2015   Procedure: Right/Left Heart Cath and Coronary Angiography;  Surgeon: Sherren Mocha, MD;  Location: [place] [facility];  Service: Cardiovascular;  Laterality: N/A;  . COLONOSCOPY  08/2001   Colon cancer screening in 01/2012  . LAPAROSCOPIC CHOLECYSTECTOMY  1990  . MITRAL VALVE REPAIR Right 02/01/2016   Procedure: MINIMALLY INVASIVE MITRAL VALVE REPAIR (MVR) with closure of PFO;  Surgeon: Rexene Alberts, MD;  Location: [place];  Service: Open Heart Surgery;  Laterality: Right;  . TEE WITHOUT CARDIOVERSION N/A 10/14/2015   Procedure: TRANSESOPHAGEAL ECHOCARDIOGRAM (TEE);  Surgeon: Thayer Headings, MD;  Location: [place];  Service: Cardiovascular;  Laterality: N/A;  . TEE WITHOUT CARDIOVERSION N/A 02/01/2016   Procedure: TRANSESOPHAGEAL ECHOCARDIOGRAM (TEE);  Surgeon: Rexene Alberts, MD;  Location: [place];  Service: Open Heart Surgery;  Laterality: N/A;  . TOTAL ABDOMINAL HYSTERECTOMY  1983   Cervical cancer 1983    There were no vitals filed for this visit.  Subjective Assessment - 03/04/18 1434    Subjective  My knee feels the same as it did on Thursday.      Currently in Pain?  Yes    Pain Score  3     Pain Orientation  Left    Pain  Descriptors / Indicators  Aching;Sore    Pain Type  Chronic pain    Pain Onset  More than a month ago    Aggravating Factors   walking, stairs     Pain Relieving Factors  exercises , MHP          [facility] PT Assessment - 03/04/18 0001      Strength   Right/Left Hip  Left    Right Hip Extension  4/5    Left Hip Extension  3+/5   glute 3+/5    Left Knee Flexion  3/5      Flexibility   Hamstrings  90/90 Rt 20, Lt. 37 deg         [facility] Adult PT Treatment/Exercise - 03/04/18 0001      Knee/Hip Exercises: Stretches   Active Hamstring Stretch  Left;5 reps    Active Hamstring Stretch Limitations  contract relax with strap    ITB Stretch  Left;3  reps    ITB Stretch Limitations  strap      Knee/Hip Exercises: Aerobic   Stationary Bike  6 min L2       Knee/Hip Exercises: Standing   Forward Lunges  Left;1 set;20 reps    Forward Lunges Limitations  with manual pressure distal fibula, moving posteriorly       Knee/Hip Exercises: Seated   Long Arc Quad  Strengthening;Left;1 set;20 reps;Weights    Long Arc Quad Weight  4 lbs.    Hamstring Curl  Strengthening;Left;1 set;5 reps    Hamstring Limitations  pain, unable to do with blue band       Knee/Hip Exercises: Prone   Hamstring Curl  1 set;15 reps    Hamstring Curl Limitations  4 lbs ,sharp pain when she moved quickly     Hip Extension  Strengthening;Left;1 set;10 reps    Hip Extension Limitations  2 sets, 1 set straight 1 set bent       Modalities   Modalities  Iontophoresis      Cryotherapy   Number Minutes Cryotherapy  6 Minutes    Cryotherapy Location  Knee    Type of Cryotherapy  Ice pack      Iontophoresis   Type of Iontophoresis  Dexamethasone    Location  fibular head     Dose  1 cc     Time  6 hr patch       Manual Therapy   Joint Mobilization  fibular mobilizations proximal pulled anterior, distal pushed posterior    A/P mobilizations            PT Education - 03/04/18 1825    Education Details  knee anatomy, iontophoresis, rec she f/u with MD     Person(s) Educated  Patient    Methods  Explanation    Comprehension  Verbalized understanding          PT Long Term Goals - 02/18/18 1540      PT LONG TERM GOAL #1   Title  Pt will be I and compliant with HEP. 6 weeks 03/12/18    Status  On-going      PT LONG TERM GOAL #2   Title  Pt will increase FOTO to less than 40% limited. 6 weeks 03/12/18    Status  Unable to assess      PT LONG TERM GOAL #3   Title  Pt will improve Lt LE strength to at least 5-/5 MMT. 6 weeks 03/12/18    Status  On-going  PT LONG TERM GOAL #4   Title  Pt will improve Lt knee ROM to WNL. 6 weeks 03/12/18    Status   On-going      PT LONG TERM GOAL #5   Title  Pt will report less than 1-2/10 pain overall with walking and stairs.6 weeks 03/12/18    Status  On-going            Plan - 03/04/18 1825    Clinical Impression Statement  Pt cont to have pain 3/10-4/10.  Thinks the exercises help but has some pain with them.  Weak in L knee flexion, L hip extension.  Will call MD to explore other imaging or options.  Does say that the "hot spot" at fibualr head is not as "hot" as it used to be.  May consider dry needling to biceps femoris. Applied ionto patch at fibular head to address inflammation.     PT Treatment/Interventions  ADLs/Self Care Home Management;Cryotherapy;Electrical Stimulation;Iontophoresis 4mg /ml Dexamethasone;Moist Heat;Ultrasound;Therapeutic exercise;Therapeutic activities;Neuromuscular re-education;Manual techniques;Passive range of motion;Dry needling;Taping    PT Next Visit Plan  try mobilizing fibular head anteriorly, posterior ankle with knee bends,   Repeat US , work towards step ups/down, ionto?    PT Home Exercise Plan  hamstring, ITB, quad, bridge, quad set, SLR , ankle Theraband eversion/inversion     Consulted and Agree with Plan of Care  Patient       Patient will benefit from skilled therapeutic intervention in order to improve the following deficits and impairments:  Abnormal gait, Decreased activity tolerance, Decreased range of motion, Decreased strength, Impaired flexibility, Increased fascial restricitons, Pain  Visit Diagnosis: Chronic pain of left knee     Problem List Patient Active Problem List   Diagnosis Date Noted  . Hypotension 02/15/2017  . Long term (current) use of anticoagulants [Z79.01] 02/09/2016  . History of mitral valve repair 02/01/2016    Destiny Andersen 03/04/2018, 6:29 PM  [place], [place], [address] Phone: [phone]   Fax:  [phone]  Name: Destiny Andersen MRN: [id number] Date of Birth: 05/04/1946  Raeford Razor, PT 03/04/18 6:30 PM Phone: [phone] Fax: [phone]

## 2018-03-10 ENCOUNTER — Ambulatory Visit: Payer: Medicare Other | Admitting: Sports Medicine

## 2018-03-10 ENCOUNTER — Ambulatory Visit
Admission: RE | Admit: 2018-03-10 | Discharge: 2018-03-10 | Disposition: A | Discharge status: Home / Self Care | Payer: Medicare Other | Source: Ambulatory Visit | Attending: Sports Medicine | Admitting: Sports Medicine

## 2018-03-10 VITALS — BP 108/68 | Ht 64.0 in | Wt 132.0 lb

## 2018-03-10 DIAGNOSIS — M25562 Pain in left knee: Secondary | ICD-10-CM | POA: Diagnosis not present

## 2018-03-11 ENCOUNTER — Ambulatory Visit: Payer: Medicare Other | Admitting: Physical Therapy

## 2018-03-11 ENCOUNTER — Encounter: Payer: Self-pay | Admitting: Physical Therapy

## 2018-03-11 DIAGNOSIS — M25562 Pain in left knee: Secondary | ICD-10-CM | POA: Diagnosis not present

## 2018-03-11 DIAGNOSIS — G8929 Other chronic pain: Secondary | ICD-10-CM

## 2018-03-11 NOTE — Therapy (Signed)
[place], [place], [address] Phone: [phone]   Fax:  [phone]  Physical Therapy Treatment  Patient Details  Name: Destiny Andersen MRN: [id number] Date of Birth: 1946/05/16 Referring Provider: Renata Caprice   Encounter Date: 03/11/2018  PT End of Session - 03/11/18 1542    Visit Number  8    Number of Visits  12    Date for PT Re-Evaluation  03/12/18    PT Start Time  9357    PT Stop Time  1505    PT Time Calculation (min)  46 min    Activity Tolerance  Patient tolerated treatment well    Behavior During Therapy  [facility] for tasks assessed/performed       Past Medical History:  Diagnosis Date  . Cancer [facility]) 1983   Cervical cancer.  Skin Cancer- leg  . Chronic headaches    "? Migraines"- does not have then any more  . Constipation   . Elevated C-reactive protein (CRP)   . Family history of breast cancer    Mother  . Fatigue   . Heart murmur    ECHO 02/10/13 EF = 60-65%, moderate posterior mitral valve prolapse w/possible flail segment, severe MR, mitral regurgitant jet is anteriorly directed.  Marland Kitchen History of blood transfusion    pre-eclampsia   . Hyperpotassemia    Migrated  . Medicare annual wellness visit, subsequent   . Mitral valve prolapse   . MR (mitral regurgitation)    ECHO 02/10/13 EF = 60-65%, moderate posterior mitral valve prolapse w/possible flail segment, severe MR, mitral regurgitant jet is anteriorly directed.  . Risk for falls   . Routine general medical examination at a health care facility   . S/P minimally invasive mitral valve repair 02/01/2016   Complex valvuloplasty including triangular resection of flail posterior leaflet, artificial Gore-tex neochord placement x10 and 28 mm Sorin Memo 3D ring annuloplasty via right mini thoracotomy approach  . Seizures ([place])    with pre- echa  . Shortness of breath dyspnea    with exertion    Past Surgical History:  Procedure  Laterality Date  . BREAST BIOPSY Right 1998   benign cyst  . CARDIAC CATHETERIZATION N/A 12/21/2015   Procedure: Right/Left Heart Cath and Coronary Angiography;  Surgeon: Sherren Mocha, MD;  Location: [place] [facility];  Service: Cardiovascular;  Laterality: N/A;  . COLONOSCOPY  08/2001   Colon cancer screening in 01/2012  . LAPAROSCOPIC CHOLECYSTECTOMY  1990  . MITRAL VALVE REPAIR Right 02/01/2016   Procedure: MINIMALLY INVASIVE MITRAL VALVE REPAIR (MVR) with closure of PFO;  Surgeon: Rexene Alberts, MD;  Location: [place];  Service: Open Heart Surgery;  Laterality: Right;  . TEE WITHOUT CARDIOVERSION N/A 10/14/2015   Procedure: TRANSESOPHAGEAL ECHOCARDIOGRAM (TEE);  Surgeon: Thayer Headings, MD;  Location: [place];  Service: Cardiovascular;  Laterality: N/A;  . TEE WITHOUT CARDIOVERSION N/A 02/01/2016   Procedure: TRANSESOPHAGEAL ECHOCARDIOGRAM (TEE);  Surgeon: Rexene Alberts, MD;  Location: [place];  Service: Open Heart Surgery;  Laterality: N/A;  . TOTAL ABDOMINAL HYSTERECTOMY  1983   Cervical cancer 1983    There were no vitals filed for this visit.  Subjective Assessment - 03/11/18 1422    Subjective  Saw Dr. Micheline Chapman.  My fibula is normal.  Continue PT.  Pain is the same.      Currently in Pain?  No/denies    Pain Score  6     Pain Location  Knee    Pain Orientation  Left    Pain Descriptors / Indicators  Aching    Pain Type  Chronic pain    Pain Onset  More than a month ago    Pain Frequency  Intermittent    Aggravating Factors   walking, stairs, bending     Pain Relieving Factors  rest, brace, RICE , heating pad          [facility] Adult PT Treatment/Exercise - 03/11/18 0001      Knee/Hip Exercises: Stretches   Active Hamstring Stretch  Left;5 reps    ITB Stretch  Left;3 reps    Gastroc Stretch  Both;2 reps    Soleus Stretch  Both;2 reps      Knee/Hip Exercises: Standing   Other Standing Knee Exercises  single leg hip hinge with UE support 2 x 5 reps each side, cues for  hips square       Knee/Hip Exercises: Sidelying   Clams  x 20 green       Knee/Hip Exercises: Prone   Hip Extension  Strengthening;Left;3 sets;10 reps    Hip Extension Limitations  quadruped, prone     Other Prone Exercises  prone quad set x 10       Ultrasound   Ultrasound Location  post lateral hamstring    Ultrasound Parameters  100% 1.2 W/cm2, 1 MHz     Ultrasound Goals  Pain             PT Education - 03/11/18 1435    Education Details  hip strength     Person(s) Educated  Patient    Methods  Explanation    Comprehension  Verbalized understanding          PT Long Term Goals - 02/18/18 1540      PT LONG TERM GOAL #1   Title  Pt will be I and compliant with HEP. 6 weeks 03/12/18    Status  On-going      PT LONG TERM GOAL #2   Title  Pt will increase FOTO to less than 40% limited. 6 weeks 03/12/18    Status  Unable to assess      PT LONG TERM GOAL #3   Title  Pt will improve Lt LE strength to at least 5-/5 MMT. 6 weeks 03/12/18    Status  On-going      PT LONG TERM GOAL #4   Title  Pt will improve Lt knee ROM to WNL. 6 weeks 03/12/18    Status  On-going      PT LONG TERM GOAL #5   Title  Pt will report less than 1-2/10 pain overall with walking and stairs.6 weeks 03/12/18    Status  On-going            Plan - 03/11/18 1442    Clinical Impression Statement  Addressed posterior knee flexibility, hip strength today and integrated core a bit.  Upgraded HEP.  No increased pain.     PT Treatment/Interventions  ADLs/Self Care Home Management;Cryotherapy;Electrical Stimulation;Iontophoresis 4mg /ml Dexamethasone;Moist Heat;Ultrasound;Therapeutic exercise;Therapeutic activities;Neuromuscular re-education;Manual techniques;Passive range of motion;Dry needling;Taping    PT Next Visit Plan  FOTO! Renew.  Cont hip strenght, check new HEP. [place]    PT Home Exercise Plan  hamstring, ITB, quad, bridge, quad set, SLR , (ankle Theraband eversion/inversion), added in  quadruped leg lift, single bridge     Consulted and Agree with Plan of Care  Patient       Patient  will benefit from skilled therapeutic intervention in order to improve the following deficits and impairments:  Abnormal gait, Decreased activity tolerance, Decreased range of motion, Decreased strength, Impaired flexibility, Increased fascial restricitons, Pain  Visit Diagnosis: Chronic pain of left knee     Problem List Patient Active Problem List   Diagnosis Date Noted  . Hypotension 02/15/2017  . Long term (current) use of anticoagulants [Z79.01] 02/09/2016  . History of mitral valve repair 02/01/2016    Demico Ploch 03/11/2018, 4:41 PM  [facility] [address] [place], [place], [address] Phone: [phone]   Fax:  [phone]  Name: MIKAILAH MOREL MRN: [id number] Date of Birth: 02-01-1946  Raeford Razor, PT 03/11/18 4:41 PM Phone: [phone] Fax: [phone]

## 2018-03-11 NOTE — Progress Notes (Signed)
   Subjective:    Patient ID: Destiny Andersen, female    DOB: 1946/05/18, 72 y.o.   MRN: [id number]  HPI   Patient comes in today with a complaint of lateral knee pain.  She has been working with physical therapy after a recent MRI of her left knee showed some mild osteoarthritis.  While working with physical therapy, it was noted that she was having significant pain right over the fibular head.  Patient comes in today for me to evaluate further.  Patient has pain with direct pressure over the area as well as with weightbearing.  Despite this, she does feel like her symptoms have overall improved since her last office visit with me.  She notices that heat helps tremendously.  She does complain of some pain in the posterior knee as well.  Pain will at times radiate down the lateral aspect of the lower leg.  She denies numbness or tingling.    Review of Systems    As above Objective:   Physical Exam  Well-developed, well-nourished.  No acute distress.  Awake alert and oriented x3.  Vital signs reviewed.  Left knee: Full range of motion.  No obvious effusion.  Patient is tender to palpation directly over the fibular head as well as along the distal hamstring tendons laterally.  No palpable Baker's cyst.  Knee is stable to ligamentous exam.  Negative McMurray's.  Neurovascularly intact distally.  Walking with a slight limp.  AP and lateral tib-fib x-ray is unremarkable.  Specifically I see no abnormality at the proximal fibula  Bedside ultrasound of the left knee also shows no evidence of abnormality of the proximal fibula.  There is some fluid around the lateral hamstring tendons however.      Assessment & Plan:   Lateral left knee pain likely secondary to distal hamstring tendinopathy  Reassurance regarding the x-ray.  Recent MRI of her knee was fairly unremarkable other than some mild degenerative changes.  I would like for her to continue working with physical therapy and follow-up with  me again in 3 to 4 weeks.  Review of her physical therapy notes shows she continues to have significant hip weakness and hamstring weakness so hopefully her pain will improve as her strength increases.  I would also like for them to continue using modalities and physical therapy to help with pain control.  Patient is encouraged to call me with questions or concerns prior to her follow-up visit.

## 2018-03-13 ENCOUNTER — Ambulatory Visit: Payer: Medicare Other | Admitting: Physical Therapy

## 2018-03-13 ENCOUNTER — Encounter: Payer: Self-pay | Admitting: Physical Therapy

## 2018-03-13 DIAGNOSIS — M25562 Pain in left knee: Principal | ICD-10-CM

## 2018-03-13 DIAGNOSIS — G8929 Other chronic pain: Secondary | ICD-10-CM

## 2018-03-13 NOTE — Therapy (Signed)
[place], [place], [address] Phone: [phone]   Fax:  [phone]  Physical Therapy Treatment/Renewal  Patient Details  Name: Destiny Andersen MRN: [id number] Date of Birth: [birth year] Referring Provider (PT): Renata Caprice   Encounter Date: 03/13/2018  PT End of Session - 03/13/18 1337    Visit Number  9    Number of Visits  17    Date for PT Re-Evaluation  04/17/18    PT Start Time  1332    PT Stop Time  1426    PT Time Calculation (min)  54 min    Activity Tolerance  Patient tolerated treatment well    Behavior During Therapy  [facility] for tasks assessed/performed       Past Medical History:  Diagnosis Date  . Cancer [facility]) 1983   Cervical cancer.  Skin Cancer- leg  . Chronic headaches    "? Migraines"- does not have then any more  . Constipation   . Elevated C-reactive protein (CRP)   . Family history of breast cancer    Mother  . Fatigue   . Heart murmur    ECHO 02/10/13 EF = 60-65%, moderate posterior mitral valve prolapse w/possible flail segment, severe MR, mitral regurgitant jet is anteriorly directed.  Marland Kitchen History of blood transfusion    pre-eclampsia   . Hyperpotassemia    Migrated  . Medicare annual wellness visit, subsequent   . Mitral valve prolapse   . MR (mitral regurgitation)    ECHO 02/10/13 EF = 60-65%, moderate posterior mitral valve prolapse w/possible flail segment, severe MR, mitral regurgitant jet is anteriorly directed.  . Risk for falls   . Routine general medical examination at a health care facility   . S/P minimally invasive mitral valve repair 02/01/2016   Complex valvuloplasty including triangular resection of flail posterior leaflet, artificial Gore-tex neochord placement x10 and 28 mm Sorin Memo 3D ring annuloplasty via right mini thoracotomy approach  . Seizures ([place])    with pre- echa  . Shortness of breath dyspnea    with exertion    Past Surgical History:   Procedure Laterality Date  . BREAST BIOPSY Right 1998   benign cyst  . CARDIAC CATHETERIZATION N/A 12/21/2015   Procedure: Right/Left Heart Cath and Coronary Angiography;  Surgeon: Sherren Mocha, MD;  Location: [place] [facility];  Service: Cardiovascular;  Laterality: N/A;  . COLONOSCOPY  08/2001   Colon cancer screening in 01/2012  . LAPAROSCOPIC CHOLECYSTECTOMY  1990  . MITRAL VALVE REPAIR Right 02/01/2016   Procedure: MINIMALLY INVASIVE MITRAL VALVE REPAIR (MVR) with closure of PFO;  Surgeon: Rexene Alberts, MD;  Location: [place];  Service: Open Heart Surgery;  Laterality: Right;  . TEE WITHOUT CARDIOVERSION N/A 10/14/2015   Procedure: TRANSESOPHAGEAL ECHOCARDIOGRAM (TEE);  Surgeon: Thayer Headings, MD;  Location: [place];  Service: Cardiovascular;  Laterality: N/A;  . TEE WITHOUT CARDIOVERSION N/A 02/01/2016   Procedure: TRANSESOPHAGEAL ECHOCARDIOGRAM (TEE);  Surgeon: Rexene Alberts, MD;  Location: [place];  Service: Open Heart Surgery;  Laterality: N/A;  . TOTAL ABDOMINAL HYSTERECTOMY  1983   Cervical cancer 1983    There were no vitals filed for this visit.  Subjective Assessment - 03/13/18 1334    Subjective  I'm walking better, smoother.  No pain.  I can't do that donkey kick.      Currently in Pain?  No/denies         [facility] PT Assessment - 03/13/18 0001  Observation/Other Assessments   Focus on Therapeutic Outcomes (FOTO)   54%   same as initial     AROM   Left Knee Extension  -3    Left Knee Flexion  130      Strength   Right Hip Extension  4/5    Right Hip ABduction  3+/5    Right Hip ADduction  3+/5    Left Hip ABduction  3+/5    Left Hip ADduction  3/5    Left Knee Flexion  3/5      Flexibility   Hamstrings  90/90 Rt 20, Lt. 25 deg                    [facility] Adult PT Treatment/Exercise - 03/13/18 0001      Knee/Hip Exercises: Stretches   Active Hamstring Stretch  Left;2 reps;60 seconds    Gastroc Stretch  Both;2 reps      Knee/Hip  Exercises: Aerobic   Stationary Bike  6 min L2       Knee/Hip Exercises: Standing   Hip Extension  Stengthening;Left;2 sets;20 reps;Knee bent;Knee straight    Functional Squat  1 set;10 reps    Functional Squat Limitations  band on thighs       Knee/Hip Exercises: Seated   Long Arc Quad  Strengthening;Left;1 set;20 reps;Weights    Long Arc Quad Weight  4 lbs.    Sit to Sand  2 sets;10 reps;without UE support   1 set ball squeeze, 1 set band for abd/ER      Knee/Hip Exercises: Sidelying   Hip ABduction  Strengthening;Both;1 set;20 reps    Hip ADduction  Strengthening;Both;1 set;20 reps    Clams  x 20 no band, full diamond clam       Ultrasound   Ultrasound Location  post hamstring lateral and into fibular head caution for nerve     Ultrasound Parameters  100%, 1.3 W/cm2, 1 MHz    Ultrasound Goals  Pain      Manual Therapy   Soft tissue mobilization  L post thigh, knee hamstring , ITB              PT Education - 03/13/18 1429    Education Details  modification for hip strength     Person(s) Educated  Patient    Methods  Explanation    Comprehension  Verbalized understanding;Returned demonstration          PT Long Term Goals - 03/13/18 1429      PT LONG TERM GOAL #1   Title  Pt will be I and compliant with HEP. 6 weeks 03/12/18    Status  On-going      PT LONG TERM GOAL #2   Title  Pt will increase FOTO to less than 40% limited. 6 weeks 03/12/18    Status  On-going      PT LONG TERM GOAL #3   Title  Pt will improve Lt LE strength to at least 5-/5 MMT. 6 weeks 03/12/18    Status  On-going      PT LONG TERM GOAL #4   Title  Pt will improve Lt knee ROM to WNL. 6 weeks 03/12/18    Status  On-going      PT LONG TERM GOAL #5   Title  Pt will report less than 1-2/10 pain overall with walking and stairs.6 weeks 03/12/18    Status  On-going  Plan - 03/13/18 1337    Clinical Impression Statement  Pt renewed today for 1 more month of PT.  She has  improved gait, pain levels but continues to have hip weakness and difficulty with stair negotiation.  Her FOTO score has remained the same despite subjective report of improvement.  No limp with gait.  A bit stiff with sit to stand.  Cont POC.     PT Frequency  2x / week    PT Duration  4 weeks    PT Treatment/Interventions  ADLs/Self Care Home Management;Cryotherapy;Electrical Stimulation;Iontophoresis 4mg /ml Dexamethasone;Moist Heat;Ultrasound;Therapeutic exercise;Therapeutic activities;Neuromuscular re-education;Manual techniques;Passive range of motion;Dry needling;Taping    PT Next Visit Plan  Cont hip strength, check new HEP. [place], closed chain as tolerated    PT Home Exercise Plan  hamstring, ITB, quad, bridge, quad set, SLR , (ankle Theraband eversion/inversion), added in quadruped leg lift, single bridge     Consulted and Agree with Plan of Care  Patient       Patient will benefit from skilled therapeutic intervention in order to improve the following deficits and impairments:  Abnormal gait, Decreased activity tolerance, Decreased range of motion, Decreased strength, Impaired flexibility, Increased fascial restricitons, Pain  Visit Diagnosis: Chronic pain of left knee     Problem List Patient Active Problem List   Diagnosis Date Noted  . Hypotension 02/15/2017  . Long term (current) use of anticoagulants [Z79.01] 02/09/2016  . History of mitral valve repair 02/01/2016    PAA,JENNIFER 03/13/2018, 2:35 PM  [place], [place], [address] Phone: [phone]   Fax:  [phone]  Name: Destiny Andersen MRN: [id number] Date of Birth: 01-Jan-1946  Raeford Razor, PT 03/13/18 2:36 PM Phone: [phone] Fax: [phone]

## 2018-03-18 ENCOUNTER — Ambulatory Visit: Payer: Medicare Other | Attending: Sports Medicine | Admitting: Physical Therapy

## 2018-03-18 ENCOUNTER — Encounter: Payer: Self-pay | Admitting: Physical Therapy

## 2018-03-18 DIAGNOSIS — G8929 Other chronic pain: Secondary | ICD-10-CM | POA: Insufficient documentation

## 2018-03-18 DIAGNOSIS — M25562 Pain in left knee: Secondary | ICD-10-CM | POA: Insufficient documentation

## 2018-03-18 NOTE — Therapy (Signed)
[place], [place], [address] Phone: [phone]   Fax:  [phone]  Physical Therapy Treatment  Patient Details  Name: Destiny Andersen MRN: [id number] Date of Birth: 20-Jul-1945 Referring Provider (PT): Renata Caprice   Encounter Date: 03/18/2018  PT End of Session - 03/18/18 1414    Visit Number  10    Number of Visits  17    Date for PT Re-Evaluation  04/17/18    PT Start Time  9977    PT Stop Time  1430    PT Time Calculation (min)  67 min    Activity Tolerance  Patient tolerated treatment well    Behavior During Therapy  [facility] for tasks assessed/performed       Past Medical History:  Diagnosis Date  . Cancer [facility]) 1983   Cervical cancer.  Skin Cancer- leg  . Chronic headaches    "? Migraines"- does not have then any more  . Constipation   . Elevated C-reactive protein (CRP)   . Family history of breast cancer    Mother  . Fatigue   . Heart murmur    ECHO 02/10/13 EF = 60-65%, moderate posterior mitral valve prolapse w/possible flail segment, severe MR, mitral regurgitant jet is anteriorly directed.  Marland Kitchen History of blood transfusion    pre-eclampsia   . Hyperpotassemia    Migrated  . Medicare annual wellness visit, subsequent   . Mitral valve prolapse   . MR (mitral regurgitation)    ECHO 02/10/13 EF = 60-65%, moderate posterior mitral valve prolapse w/possible flail segment, severe MR, mitral regurgitant jet is anteriorly directed.  . Risk for falls   . Routine general medical examination at a health care facility   . S/P minimally invasive mitral valve repair 02/01/2016   Complex valvuloplasty including triangular resection of flail posterior leaflet, artificial Gore-tex neochord placement x10 and 28 mm Sorin Memo 3D ring annuloplasty via right mini thoracotomy approach  . Seizures ([place])    with pre- echa  . Shortness of breath dyspnea    with exertion    Past Surgical History:  Procedure  Laterality Date  . BREAST BIOPSY Right 1998   benign cyst  . CARDIAC CATHETERIZATION N/A 12/21/2015   Procedure: Right/Left Heart Cath and Coronary Angiography;  Surgeon: Sherren Mocha, MD;  Location: [place] [facility];  Service: Cardiovascular;  Laterality: N/A;  . COLONOSCOPY  08/2001   Colon cancer screening in 01/2012  . LAPAROSCOPIC CHOLECYSTECTOMY  1990  . MITRAL VALVE REPAIR Right 02/01/2016   Procedure: MINIMALLY INVASIVE MITRAL VALVE REPAIR (MVR) with closure of PFO;  Surgeon: Rexene Alberts, MD;  Location: [place];  Service: Open Heart Surgery;  Laterality: Right;  . TEE WITHOUT CARDIOVERSION N/A 10/14/2015   Procedure: TRANSESOPHAGEAL ECHOCARDIOGRAM (TEE);  Surgeon: Thayer Headings, MD;  Location: [place];  Service: Cardiovascular;  Laterality: N/A;  . TEE WITHOUT CARDIOVERSION N/A 02/01/2016   Procedure: TRANSESOPHAGEAL ECHOCARDIOGRAM (TEE);  Surgeon: Rexene Alberts, MD;  Location: [place];  Service: Open Heart Surgery;  Laterality: N/A;  . TOTAL ABDOMINAL HYSTERECTOMY  1983   Cervical cancer 1983    There were no vitals filed for this visit.  Subjective Assessment - 03/18/18 1335    Subjective  I went down the stairs without much difficulty. No pain right now.  Worked out in [employer] today, did squats.     Currently in Pain?  No/denies  [facility] Adult PT Treatment/Exercise - 03/18/18 0001      Pilates   Pilates Reformer  Scooter 1 Red each side x 10 min cues for pelvis.  Footwork: 2 Red 1 blue parallel and turnout all on heels.  Single leg x 10 press out.    Feet in straps 1 Red 1 yellow Arc, circles and squats    Other Pilates  Reformer heel raise 3 sets x 10 (varied positions)    bridging 3 springs x 10     Knee/Hip Exercises: Stretches   Active Hamstring Stretch  Left;2 reps;60 seconds    Quad Stretch  Left;2 reps    ITB Tax adviser  Both;2 reps      Knee/Hip Exercises: Aerobic   Elliptical  5 min L1 ramp and L1 resistance        Knee/Hip Exercises: Standing   Forward Lunges  Right;Left;1 set;10 reps    Forward Lunges Limitations  on Bosu, Needed pole for assist     Functional Squat  1 set;10 reps    Functional Squat Limitations  BOSU min to no UE     Other Standing Knee Exercises  BOSU dynamic and static balance        Pilates Reformer used for LE/core strength, postural strength, lumbopelvic disassociation and core control.  Exercises included: Footwork Bridging  Feet in Straps: See above for notes.  Needed cues for pelvic stability, full knee extension LLE.          PT Education - 03/18/18 1431    Education Details  form with squats, core Pilates     Person(s) Educated  Patient    Methods  Explanation    Comprehension  Verbalized understanding;Returned demonstration          PT Long Term Goals - 03/18/18 1431      PT LONG TERM GOAL #1   Title  Pt will be I and compliant with HEP. 6 weeks 03/12/18    Status  On-going      PT LONG TERM GOAL #2   Title  Pt will increase FOTO to less than 40% limited. 6 weeks 03/12/18    Baseline  54%    Status  On-going      PT LONG TERM GOAL #3   Title  Pt will improve Lt LE strength to at least 5-/5 MMT. 6 weeks 03/12/18    Status  On-going      PT LONG TERM GOAL #4   Title  Pt will improve Lt knee ROM to WNL. 6 weeks 03/12/18    Baseline  still end range flex and ext     Status  On-going      PT LONG TERM GOAL #5   Title  Pt will report less than 1-2/10 pain overall with walking and stairs.6 weeks 03/12/18    Baseline  met this week     Status  Partially Met            Plan - 03/18/18 1433    Clinical Impression Statement  Patient challenged with more closed chain exercises for balance, strength, endurance. Pt continues to improve, able to go down stairs reciprocally without pain. Felt "loose" and no pain post session.     PT Treatment/Interventions  ADLs/Self Care Home Management;Cryotherapy;Electrical Stimulation;Iontophoresis 37m/ml  Dexamethasone;Moist Heat;Ultrasound;Therapeutic exercise;Therapeutic activities;Neuromuscular re-education;Manual techniques;Passive range of motion;Dry needling;Taping    PT Next Visit Plan  Cont hip strength, check new HEP. [place] closed chain  as tolerated    PT Home Exercise Plan  hamstring, ITB, quad, bridge, quad set, SLR , (ankle Theraband eversion/inversion), added in quadruped leg lift, single bridge     Consulted and Agree with Plan of Care  Patient       Patient will benefit from skilled therapeutic intervention in order to improve the following deficits and impairments:  Abnormal gait, Decreased activity tolerance, Decreased range of motion, Decreased strength, Impaired flexibility, Increased fascial restricitons, Pain  Visit Diagnosis: Chronic pain of left knee     Problem List Patient Active Problem List   Diagnosis Date Noted  . Hypotension 02/15/2017  . Long term (current) use of anticoagulants [Z79.01] 02/09/2016  . History of mitral valve repair 02/01/2016    Jonluke Cobbins 03/18/2018, 2:40 PM  [place], [place], [address] Phone: [phone]   Fax:  [phone]  Name: TASHA DIAZ MRN: [id number] Date of Birth: 09/04/1945  Raeford Razor, PT 03/18/18 2:41 PM Phone: [phone] Fax: [phone]

## 2018-03-25 ENCOUNTER — Encounter: Payer: Medicare Other | Admitting: Physical Therapy

## 2018-03-25 ENCOUNTER — Encounter: Payer: Self-pay | Admitting: Physical Therapy

## 2018-03-25 ENCOUNTER — Ambulatory Visit: Payer: Medicare Other | Admitting: Physical Therapy

## 2018-03-25 DIAGNOSIS — G8929 Other chronic pain: Secondary | ICD-10-CM

## 2018-03-25 DIAGNOSIS — M25562 Pain in left knee: Secondary | ICD-10-CM | POA: Diagnosis not present

## 2018-03-25 NOTE — Therapy (Signed)
[place], [place], [address] Phone: [phone]   Fax:  [phone]  Physical Therapy Treatment  Patient Details  Name: Destiny Andersen MRN: [id number] Date of Birth: Apr 23, 1946 Referring Provider (PT): Renata Caprice   Encounter Date: 03/25/2018  PT End of Session - 03/25/18 1113    Visit Number  11    Number of Visits  17    Date for PT Re-Evaluation  04/17/18    PT Start Time  1100    PT Stop Time  1150    PT Time Calculation (min)  50 min    Activity Tolerance  Patient tolerated treatment well    Behavior During Therapy  [facility] for tasks assessed/performed       Past Medical History:  Diagnosis Date  . Cancer [facility]) 1983   Cervical cancer.  Skin Cancer- leg  . Chronic headaches    "? Migraines"- does not have then any more  . Constipation   . Elevated C-reactive protein (CRP)   . Family history of breast cancer    Mother  . Fatigue   . Heart murmur    ECHO 02/10/13 EF = 60-65%, moderate posterior mitral valve prolapse w/possible flail segment, severe MR, mitral regurgitant jet is anteriorly directed.  Marland Kitchen History of blood transfusion    pre-eclampsia   . Hyperpotassemia    Migrated  . Medicare annual wellness visit, subsequent   . Mitral valve prolapse   . MR (mitral regurgitation)    ECHO 02/10/13 EF = 60-65%, moderate posterior mitral valve prolapse w/possible flail segment, severe MR, mitral regurgitant jet is anteriorly directed.  . Risk for falls   . Routine general medical examination at a health care facility   . S/P minimally invasive mitral valve repair 02/01/2016   Complex valvuloplasty including triangular resection of flail posterior leaflet, artificial Gore-tex neochord placement x10 and 28 mm Sorin Memo 3D ring annuloplasty via right mini thoracotomy approach  . Seizures ([place])    with pre- echa  . Shortness of breath dyspnea    with exertion    Past Surgical History:  Procedure  Laterality Date  . BREAST BIOPSY Right 1998   benign cyst  . CARDIAC CATHETERIZATION N/A 12/21/2015   Procedure: Right/Left Heart Cath and Coronary Angiography;  Surgeon: Sherren Mocha, MD;  Location: [place] [facility];  Service: Cardiovascular;  Laterality: N/A;  . COLONOSCOPY  08/2001   Colon cancer screening in 01/2012  . LAPAROSCOPIC CHOLECYSTECTOMY  1990  . MITRAL VALVE REPAIR Right 02/01/2016   Procedure: MINIMALLY INVASIVE MITRAL VALVE REPAIR (MVR) with closure of PFO;  Surgeon: Rexene Alberts, MD;  Location: [place];  Service: Open Heart Surgery;  Laterality: Right;  . TEE WITHOUT CARDIOVERSION N/A 10/14/2015   Procedure: TRANSESOPHAGEAL ECHOCARDIOGRAM (TEE);  Surgeon: Thayer Headings, MD;  Location: [place];  Service: Cardiovascular;  Laterality: N/A;  . TEE WITHOUT CARDIOVERSION N/A 02/01/2016   Procedure: TRANSESOPHAGEAL ECHOCARDIOGRAM (TEE);  Surgeon: Rexene Alberts, MD;  Location: [place];  Service: Open Heart Surgery;  Laterality: N/A;  . TOTAL ABDOMINAL HYSTERECTOMY  1983   Cervical cancer 1983    There were no vitals filed for this visit.  Subjective Assessment - 03/25/18 1101    Subjective  Patient did no exercises yesterday and knee did not hurt much.  I did them this morning and it hurts.  Had a "massage" and it hurt  alot.      Currently in Pain?  Yes    Pain Score  3     Pain Location  Knee    Pain Orientation  Left    Pain Type  Chronic pain    Pain Onset  More than a month ago    Pain Frequency  Intermittent         [facility] PT Assessment - 03/25/18 0001      AROM   Right Knee Extension  3    Left Knee Extension  7          [facility] Adult PT Treatment/Exercise - 03/25/18 0001      Pilates   Pilates Reformer  Footwork see note       Knee/Hip Exercises: Stretches   Active Hamstring Stretch  Left;3 reps    Active Hamstring Stretch Limitations  30 sec with strap       Knee/Hip Exercises: Aerobic   Elliptical  5 min L1 ramp and L1 resistance        Manual Therapy   Soft tissue mobilization  L hamstring, gastroc, L anterior tibialis     Pilates Reformer used for LE/core strength, postural strength, lumbopelvic disassociation and core control.  Exercises included:  Footwork 4 springs for double leg : heels, turnout, wide and hip distance Focus on eccentric control and LLE control 2 Red 1 Blue single leg work LLE 2 x 15 Heel raise, x 20 Stretching each LE, calf , added leg press out for endurance          PT Long Term Goals - 03/18/18 1431      PT LONG TERM GOAL #1   Title  Pt will be I and compliant with HEP. 6 weeks 03/12/18    Status  On-going      PT LONG TERM GOAL #2   Title  Pt will increase FOTO to less than 40% limited. 6 weeks 03/12/18    Baseline  54%    Status  On-going      PT LONG TERM GOAL #3   Title  Pt will improve Lt LE strength to at least 5-/5 MMT. 6 weeks 03/12/18    Status  On-going      PT LONG TERM GOAL #4   Title  Pt will improve Lt knee ROM to WNL. 6 weeks 03/12/18    Baseline  still end range flex and ext     Status  On-going      PT LONG TERM GOAL #5   Title  Pt will report less than 1-2/10 pain overall with walking and stairs.6 weeks 03/12/18    Baseline  met this week     Status  Partially Met            Plan - 03/25/18 1317    Clinical Impression Statement  Patient cont with pain and LE soft tissue restriction.  Walked without a limp post session.  Did note pain at fibular head extending down to mid shin.  Mid range pain with supine Footwork on Reformer but improves with hip externally rotated (less lateral hamstring tension).      PT Treatment/Interventions  ADLs/Self Care Home Management;Cryotherapy;Electrical Stimulation;Iontophoresis 3m/ml Dexamethasone;Moist Heat;Ultrasound;Therapeutic exercise;Therapeutic activities;Neuromuscular re-education;Manual techniques;Passive range of motion;Dry needling;Taping    PT Next Visit Plan  Cont hip strength, check new HEP. [place] closed chain as  tolerated    PT Home Exercise Plan  hamstring, ITB, quad, bridge, quad set, SLR , (ankle Theraband eversion/inversion), added in quadruped leg lift, single bridge  Consulted and Agree with Plan of Care  Patient       Patient will benefit from skilled therapeutic intervention in order to improve the following deficits and impairments:  Abnormal gait, Decreased activity tolerance, Decreased range of motion, Decreased strength, Impaired flexibility, Increased fascial restricitons, Pain  Visit Diagnosis: Chronic pain of left knee     Problem List Patient Active Problem List   Diagnosis Date Noted  . Hypotension 02/15/2017  . Long term (current) use of anticoagulants [Z79.01] 02/09/2016  . History of mitral valve repair 02/01/2016    PAA,JENNIFER 03/25/2018, 1:22 PM  [facility] [address] [place], [place], [address] Phone: [phone]   Fax:  [phone]  Name: Destiny Andersen MRN: [id number] Date of Birth: 01/31/1946   Raeford Razor, PT 03/25/18 1:24 PM Phone: [phone] Fax: [phone]

## 2018-03-27 ENCOUNTER — Ambulatory Visit: Payer: Medicare Other | Admitting: Physical Therapy

## 2018-03-27 ENCOUNTER — Encounter: Payer: Medicare Other | Admitting: Physical Therapy

## 2018-03-27 DIAGNOSIS — M25562 Pain in left knee: Principal | ICD-10-CM

## 2018-03-27 DIAGNOSIS — G8929 Other chronic pain: Secondary | ICD-10-CM

## 2018-03-27 NOTE — Therapy (Signed)
[place], [place], [address] Phone: [phone]   Fax:  [phone]  Physical Therapy Treatment  Patient Details  Name: Destiny Andersen MRN: [id number] Date of Birth: 18-May-1946 Referring Provider (PT): Renata Caprice   Encounter Date: 03/27/2018  PT End of Session - 03/27/18 1124    Visit Number  12    Number of Visits  17    Date for PT Re-Evaluation  04/17/18    PT Start Time  1105    PT Stop Time  1150    PT Time Calculation (min)  45 min    Activity Tolerance  Patient tolerated treatment well    Behavior During Therapy  [facility] for tasks assessed/performed       Past Medical History:  Diagnosis Date  . Cancer [facility]) 1983   Cervical cancer.  Skin Cancer- leg  . Chronic headaches    "? Migraines"- does not have then any more  . Constipation   . Elevated C-reactive protein (CRP)   . Family history of breast cancer    Mother  . Fatigue   . Heart murmur    ECHO 02/10/13 EF = 60-65%, moderate posterior mitral valve prolapse w/possible flail segment, severe MR, mitral regurgitant jet is anteriorly directed.  Marland Kitchen History of blood transfusion    pre-eclampsia   . Hyperpotassemia    Migrated  . Medicare annual wellness visit, subsequent   . Mitral valve prolapse   . MR (mitral regurgitation)    ECHO 02/10/13 EF = 60-65%, moderate posterior mitral valve prolapse w/possible flail segment, severe MR, mitral regurgitant jet is anteriorly directed.  . Risk for falls   . Routine general medical examination at a health care facility   . S/P minimally invasive mitral valve repair 02/01/2016   Complex valvuloplasty including triangular resection of flail posterior leaflet, artificial Gore-tex neochord placement x10 and 28 mm Sorin Memo 3D ring annuloplasty via right mini thoracotomy approach  . Seizures ([place])    with pre- echa  . Shortness of breath dyspnea    with exertion    Past Surgical History:  Procedure  Laterality Date  . BREAST BIOPSY Right 1998   benign cyst  . CARDIAC CATHETERIZATION N/A 12/21/2015   Procedure: Right/Left Heart Cath and Coronary Angiography;  Surgeon: Sherren Mocha, MD;  Location: [place] CV [facility];  Service: Cardiovascular;  Laterality: N/A;  . COLONOSCOPY  08/2001   Colon cancer screening in 01/2012  . LAPAROSCOPIC CHOLECYSTECTOMY  1990  . MITRAL VALVE REPAIR Right 02/01/2016   Procedure: MINIMALLY INVASIVE MITRAL VALVE REPAIR (MVR) with closure of PFO;  Surgeon: Rexene Alberts, MD;  Location: [place];  Service: Open Heart Surgery;  Laterality: Right;  . TEE WITHOUT CARDIOVERSION N/A 10/14/2015   Procedure: TRANSESOPHAGEAL ECHOCARDIOGRAM (TEE);  Surgeon: Thayer Headings, MD;  Location: [place];  Service: Cardiovascular;  Laterality: N/A;  . TEE WITHOUT CARDIOVERSION N/A 02/01/2016   Procedure: TRANSESOPHAGEAL ECHOCARDIOGRAM (TEE);  Surgeon: Rexene Alberts, MD;  Location: [place];  Service: Open Heart Surgery;  Laterality: N/A;  . TOTAL ABDOMINAL HYSTERECTOMY  1983   Cervical cancer 1983    There were no vitals filed for this visit.  Subjective Assessment - 03/27/18 1121    Subjective  Walked around the store without my brace. Today it hurts a bit more.     Currently in Pain?  Yes    Pain Score  4     Pain Location  Knee  Pain Orientation  Left    Pain Descriptors / Indicators  Aching    Pain Type  Chronic pain    Pain Onset  More than a month ago    Pain Frequency  Intermittent            [facility] Adult PT Treatment/Exercise - 03/27/18 0001      Knee/Hip Exercises: Aerobic   Nustep  L7 for 5 min UE and LE       Knee/Hip Exercises: Supine   Short Arc Quad Sets  Strengthening;Left;1 set;20 reps    Short Arc Quad Sets Limitations  4    Bridges Limitations  bridge with knee ext x 15     Bridges with Ball Squeeze  Strengthening;Both;1 set;10 reps    Straight Leg Raise with External Rotation  Strengthening;Left;1 set;20 reps    Straight Leg Raise with  External Rotation Limitations  4      Knee/Hip Exercises: Sidelying   Hip ABduction  Strengthening;Left;1 set;10 reps    Hip ABduction Limitations  added hip flex and ext     Clams  x 20 L hip diamond clam (lrg range)       Modalities   Modalities  Iontophoresis;Ultrasound      Ultrasound   Ultrasound Location  Lt fibular head     Ultrasound Parameters  50% , 1 .2 W/cms2, 1 Mhz    Ultrasound Goals  Pain      Iontophoresis   Type of Iontophoresis  Dexamethasone    Location  fibular head     Dose  1 cc     Time  6 hr patch       No ice today, used biofreeze for ultrasound.       PT Long Term Goals - 03/18/18 1431      PT LONG TERM GOAL #1   Title  Pt will be I and compliant with HEP. 6 weeks 03/12/18    Status  On-going      PT LONG TERM GOAL #2   Title  Pt will increase FOTO to less than 40% limited. 6 weeks 03/12/18    Baseline  54%    Status  On-going      PT LONG TERM GOAL #3   Title  Pt will improve Lt LE strength to at least 5-/5 MMT. 6 weeks 03/12/18    Status  On-going      PT LONG TERM GOAL #4   Title  Pt will improve Lt knee ROM to WNL. 6 weeks 03/12/18    Baseline  still end range flex and ext     Status  On-going      PT LONG TERM GOAL #5   Title  Pt will report less than 1-2/10 pain overall with walking and stairs.6 weeks 03/12/18    Baseline  met this week     Status  Partially Met            Plan - 03/27/18 1124    Clinical Impression Statement  Increased pain today, reverted to a less intense open chain exercise session.  She is unable to take NSAIDs for pain and inflammation. She is trying to balance exercising and rest, wears brace for the most part.  Doing all she can for improved function.  Cont with POC.     PT Treatment/Interventions  ADLs/Self Care Home Management;Cryotherapy;Electrical Stimulation;Iontophoresis 4mg/ml Dexamethasone;Moist Heat;Ultrasound;Therapeutic exercise;Therapeutic activities;Neuromuscular re-education;Manual  techniques;Passive range of motion;Dry needling;Taping    PT Next Visit Plan    How was beach, walk on sand? Cont hip strength, check new HEP. [place], closed chain as tolerated    PT Home Exercise Plan  hamstring, ITB, quad, bridge, quad set, SLR , (ankle Theraband eversion/inversion), added in quadruped leg lift, single bridge     Consulted and Agree with Plan of Care  Patient       Patient will benefit from skilled therapeutic intervention in order to improve the following deficits and impairments:  Abnormal gait, Decreased activity tolerance, Decreased range of motion, Decreased strength, Impaired flexibility, Increased fascial restricitons, Pain  Visit Diagnosis: Chronic pain of left knee     Problem List Patient Active Problem List   Diagnosis Date Noted  . Hypotension 02/15/2017  . Long term (current) use of anticoagulants [Z79.01] 02/09/2016  . History of mitral valve repair 02/01/2016    Jenavive Lamboy 03/27/2018, 12:02 PM  [facility] [address] [place], [place], [address] Phone: [phone]   Fax:  [phone]  Name: MYCHAELA LENNARTZ MRN: [id number] Date of Birth: [birth year]  Raeford Razor, PT 03/27/18 12:03 PM Phone: [phone] Fax: [phone]

## 2018-03-28 ENCOUNTER — Other Ambulatory Visit: Payer: Self-pay | Admitting: *Deleted

## 2018-03-28 MED ORDER — METOPROLOL TARTRATE 25 MG PO TABS: ORAL_TABLET | ORAL | 2 refills | Status: DC

## 2018-04-01 ENCOUNTER — Ambulatory Visit: Payer: Medicare Other | Admitting: Physical Therapy

## 2018-04-01 ENCOUNTER — Encounter: Payer: Self-pay | Admitting: Physical Therapy

## 2018-04-01 DIAGNOSIS — G8929 Other chronic pain: Secondary | ICD-10-CM

## 2018-04-01 DIAGNOSIS — M25562 Pain in left knee: Principal | ICD-10-CM

## 2018-04-01 NOTE — Therapy (Signed)
[place], [place], [address] Phone: [phone]   Fax:  [phone]  Physical Therapy Treatment  Patient Details  Name: Destiny Andersen MRN: [id number] Date of Birth: January 11, 1946 Referring Provider (PT): Renata Caprice   Encounter Date: 04/01/2018  PT End of Session - 04/01/18 4656    Visit Number  13    Number of Visits  17    Date for PT Re-Evaluation  04/17/18    PT Start Time  8127    PT Stop Time  1505    PT Time Calculation (min)  45 min    Activity Tolerance  Patient tolerated treatment well    Behavior During Therapy  [facility] for tasks assessed/performed       Past Medical History:  Diagnosis Date  . Cancer [facility]) 1983   Cervical cancer.  Skin Cancer- leg  . Chronic headaches    "? Migraines"- does not have then any more  . Constipation   . Elevated C-reactive protein (CRP)   . Family history of breast cancer    Mother  . Fatigue   . Heart murmur    ECHO 02/10/13 EF = 60-65%, moderate posterior mitral valve prolapse w/possible flail segment, severe MR, mitral regurgitant jet is anteriorly directed.  Marland Kitchen History of blood transfusion    pre-eclampsia   . Hyperpotassemia    Migrated  . Medicare annual wellness visit, subsequent   . Mitral valve prolapse   . MR (mitral regurgitation)    ECHO 02/10/13 EF = 60-65%, moderate posterior mitral valve prolapse w/possible flail segment, severe MR, mitral regurgitant jet is anteriorly directed.  . Risk for falls   . Routine general medical examination at a health care facility   . S/P minimally invasive mitral valve repair 02/01/2016   Complex valvuloplasty including triangular resection of flail posterior leaflet, artificial Gore-tex neochord placement x10 and 28 mm Sorin Memo 3D ring annuloplasty via right mini thoracotomy approach  . Seizures ([place])    with pre- echa  . Shortness of breath dyspnea    with exertion    Past Surgical History:  Procedure  Laterality Date  . BREAST BIOPSY Right 1998   benign cyst  . CARDIAC CATHETERIZATION N/A 12/21/2015   Procedure: Right/Left Heart Cath and Coronary Angiography;  Surgeon: Sherren Mocha, MD;  Location: [place] CV [facility];  Service: Cardiovascular;  Laterality: N/A;  . COLONOSCOPY  08/2001   Colon cancer screening in 01/2012  . LAPAROSCOPIC CHOLECYSTECTOMY  1990  . MITRAL VALVE REPAIR Right 02/01/2016   Procedure: MINIMALLY INVASIVE MITRAL VALVE REPAIR (MVR) with closure of PFO;  Surgeon: Rexene Alberts, MD;  Location: [place];  Service: Open Heart Surgery;  Laterality: Right;  . TEE WITHOUT CARDIOVERSION N/A 10/14/2015   Procedure: TRANSESOPHAGEAL ECHOCARDIOGRAM (TEE);  Surgeon: Thayer Headings, MD;  Location: [place];  Service: Cardiovascular;  Laterality: N/A;  . TEE WITHOUT CARDIOVERSION N/A 02/01/2016   Procedure: TRANSESOPHAGEAL ECHOCARDIOGRAM (TEE);  Surgeon: Rexene Alberts, MD;  Location: [place];  Service: Open Heart Surgery;  Laterality: N/A;  . TOTAL ABDOMINAL HYSTERECTOMY  1983   Cervical cancer 1983    There were no vitals filed for this visit.  Subjective Assessment - 04/01/18 1429    Subjective  was at the beach this weekend. The patch really helped. Did not have pain for 1 1/2 days after I had the patch on.     Currently in Pain?  Yes    Pain Score  3     Pain Location  Knee    Pain Orientation  Left    Pain Descriptors / Indicators  Aching    Pain Type  Chronic pain    Pain Onset  More than a month ago    Pain Frequency  Intermittent    Aggravating Factors   walking, stairs, bending, going downstairs     Pain Relieving Factors  rest, brace , ionto , heat           [facility] Adult PT Treatment/Exercise - 04/01/18 0001      Knee/Hip Exercises: Aerobic   Elliptical  5 min L3 ramp and L1 resistance       Knee/Hip Exercises: Machines for Strengthening   Cybex Leg Press  1 plate 3 x 15 reps less pain with hip ER, single leg x 10, 1 plate       Knee/Hip Exercises:  Standing   Hip Abduction  Stengthening;Both;1 set;15 reps;Knee straight    Lateral Step Up  Left;1 set;15 reps;Hand Hold: 1;Step Height: 4"    Forward Step Up  Both;1 set;15 reps;Hand Hold: 2;Step Height: 4";Step Height: 6"    Forward Step Up Limitations  unabel to do without 2 UE assit, cues for control of hips, trunk     Step Down  Both;1 set;Hand Hold: 2;Step Height: 4"    Step Down Limitations  step up and overs       Knee/Hip Exercises: Sidelying   Hip ABduction  Strengthening;Left;1 set;20 reps    Clams  x 20 with cues for control and stabilization              PT Education - 04/01/18 1748    Education Details  LE alignment     Person(s) Educated  Patient    Methods  Explanation    Comprehension  Verbalized understanding;Verbal cues required          PT Long Term Goals - 04/01/18 1753      PT LONG TERM GOAL #1   Title  Pt will be I and compliant with HEP. 6 weeks 03/12/18    Status  On-going      PT LONG TERM GOAL #2   Title  Pt will increase FOTO to less than 40% limited. 6 weeks 03/12/18    Status  On-going      PT LONG TERM GOAL #3   Title  Pt will improve Lt LE strength to at least 5-/5 MMT. 6 weeks 03/12/18    Status  On-going      PT LONG TERM GOAL #4   Title  Pt will improve Lt knee ROM to WNL. 6 weeks 03/12/18    Status  On-going      PT LONG TERM GOAL #5   Title  Pt will report less than 1-2/10 pain overall with walking and stairs.6 weeks 03/12/18    Status  Partially Met            Plan - 04/01/18 1754    Clinical Impression Statement  Patient had good benefit from ionto patch.  She had min pain today during session left over from her gym workout.  Worked on step ups for knee control and attention to position of hip over knee joint.  Tends to adduct.  No further goals met.     PT Treatment/Interventions  ADLs/Self Care Home Management;Cryotherapy;Electrical Stimulation;Iontophoresis 84m/ml Dexamethasone;Moist Heat;Ultrasound;Therapeutic  exercise;Therapeutic activities;Neuromuscular re-education;Manual techniques;Passive range of motion;Dry needling;Taping    PT Next Visit Plan  Cont hip strength, check new HEP. [place], closed chain as tolerated. Time to try dry needling ?    PT Home Exercise Plan  hamstring, ITB, quad, bridge, quad set, SLR , (ankle Theraband eversion/inversion), added in quadruped leg lift, single bridge     Consulted and Agree with Plan of Care  Patient       Patient will benefit from skilled therapeutic intervention in order to improve the following deficits and impairments:  Abnormal gait, Decreased activity tolerance, Decreased range of motion, Decreased strength, Impaired flexibility, Increased fascial restricitons, Pain  Visit Diagnosis: Chronic pain of left knee     Problem List Patient Active Problem List   Diagnosis Date Noted  . Hypotension 02/15/2017  . Long term (current) use of anticoagulants [Z79.01] 02/09/2016  . History of mitral valve repair 02/01/2016    Infant Doane 04/01/2018, 6:00 PM  [place], [place], [address] Phone: [phone]   Fax:  [phone]  Name: Destiny Andersen MRN: [id number] Date of Birth: Feb 28, 1946  Raeford Razor, PT 04/01/18 6:05 PM Phone: [phone] Fax: [phone]

## 2018-04-04 ENCOUNTER — Encounter

## 2018-04-08 ENCOUNTER — Ambulatory Visit: Payer: Medicare Other | Admitting: Physical Therapy

## 2018-04-08 ENCOUNTER — Encounter: Payer: Self-pay | Admitting: Physical Therapy

## 2018-04-08 DIAGNOSIS — M25562 Pain in left knee: Principal | ICD-10-CM

## 2018-04-08 DIAGNOSIS — G8929 Other chronic pain: Secondary | ICD-10-CM

## 2018-04-08 NOTE — Therapy (Signed)
[place], [place], [address] Phone: [phone]   Fax:  [phone]  Physical Therapy Treatment  Patient Details  Name: Destiny Andersen MRN: [id number] Date of Birth: 09/23/1945 Referring Provider (PT): Renata Caprice   Encounter Date: 04/08/2018  PT End of Session - 04/08/18 1401    Visit Number  14    Number of Visits  17    Date for PT Re-Evaluation  04/17/18    PT Start Time  1330    PT Stop Time  1417    PT Time Calculation (min)  47 min    Activity Tolerance  Patient tolerated treatment well    Behavior During Therapy  [facility] for tasks assessed/performed       Past Medical History:  Diagnosis Date  . Cancer [facility]) 1983   Cervical cancer.  Skin Cancer- leg  . Chronic headaches    "? Migraines"- does not have then any more  . Constipation   . Elevated C-reactive protein (CRP)   . Family history of breast cancer    Mother  . Fatigue   . Heart murmur    ECHO 02/10/13 EF = 60-65%, moderate posterior mitral valve prolapse w/possible flail segment, severe MR, mitral regurgitant jet is anteriorly directed.  Marland Kitchen History of blood transfusion    pre-eclampsia   . Hyperpotassemia    Migrated  . Medicare annual wellness visit, subsequent   . Mitral valve prolapse   . MR (mitral regurgitation)    ECHO 02/10/13 EF = 60-65%, moderate posterior mitral valve prolapse w/possible flail segment, severe MR, mitral regurgitant jet is anteriorly directed.  . Risk for falls   . Routine general medical examination at a health care facility   . S/P minimally invasive mitral valve repair 02/01/2016   Complex valvuloplasty including triangular resection of flail posterior leaflet, artificial Gore-tex neochord placement x10 and 28 mm Sorin Memo 3D ring annuloplasty via right mini thoracotomy approach  . Seizures ([place])    with pre- echa  . Shortness of breath dyspnea    with exertion    Past Surgical History:  Procedure  Laterality Date  . BREAST BIOPSY Right 1998   benign cyst  . CARDIAC CATHETERIZATION N/A 12/21/2015   Procedure: Right/Left Heart Cath and Coronary Angiography;  Surgeon: Sherren Mocha, MD;  Location: [place] [facility];  Service: Cardiovascular;  Laterality: N/A;  . COLONOSCOPY  08/2001   Colon cancer screening in 01/2012  . LAPAROSCOPIC CHOLECYSTECTOMY  1990  . MITRAL VALVE REPAIR Right 02/01/2016   Procedure: MINIMALLY INVASIVE MITRAL VALVE REPAIR (MVR) with closure of PFO;  Surgeon: Rexene Alberts, MD;  Location: [place];  Service: Open Heart Surgery;  Laterality: Right;  . TEE WITHOUT CARDIOVERSION N/A 10/14/2015   Procedure: TRANSESOPHAGEAL ECHOCARDIOGRAM (TEE);  Surgeon: Thayer Headings, MD;  Location: [place];  Service: Cardiovascular;  Laterality: N/A;  . TEE WITHOUT CARDIOVERSION N/A 02/01/2016   Procedure: TRANSESOPHAGEAL ECHOCARDIOGRAM (TEE);  Surgeon: Rexene Alberts, MD;  Location: [place];  Service: Open Heart Surgery;  Laterality: N/A;  . TOTAL ABDOMINAL HYSTERECTOMY  1983   Cervical cancer 1983    There were no vitals filed for this visit.  Subjective Assessment - 04/08/18 1335    Subjective  I'm going backwwards.  I am limping you should seen me get out of my car.      Currently in Pain?  Yes    Pain Score  8     Pain  Location  Knee    Pain Orientation  Left    Pain Descriptors / Indicators  Aching;Other (Comment)   deep   Pain Type  Chronic pain    Pain Radiating Towards  post lateral hamstring and anterior tibialis     Pain Onset  More than a month ago    Pain Frequency  Intermittent          [facility] Adult PT Treatment/Exercise - 04/08/18 0001      Knee/Hip Exercises: Stretches   Active Hamstring Stretch  Left;3 reps    Active Hamstring Stretch Limitations  30 sec with strap     ITB Stretch  Left;3 reps;30 seconds    Gastroc Stretch  Left;3 reps      Knee/Hip Exercises: Standing   Other Standing Knee Exercises  standing dynamic hamstring stretch x 10 with  UE support       Knee/Hip Exercises: Supine   Other Supine Knee/Hip Exercises  active SLR with black band followed by SLR x 10, hip circles x 10 each direction with black band     Other Supine Knee/Hip Exercises  4 way SLR x 15    sidekick series x 10      Manual Therapy   Manual therapy comments  Trigger point dry needling to lateral hamstring s     Soft tissue mobilization  L hamstring, gastroc, L anterior tibialis   post DN       Trigger Point Dry Needling - 04/08/18 1748    Consent Given?  Yes    Education Handout Provided  --   verbal education   Muscles Treated Lower Body  Hamstring   LT biceps femoris               PT Long Term Goals - 04/08/18 2140      PT LONG TERM GOAL #1   Title  Pt will be I and compliant with HEP. 6 weeks 03/12/18    Status  On-going      PT LONG TERM GOAL #2   Title  Pt will increase FOTO to less than 40% limited. 6 weeks 03/12/18    Status  Unable to assess      PT LONG TERM GOAL #3   Title  Pt will improve Lt LE strength to at least 5-/5 MMT. 6 weeks 03/12/18    Status  On-going      PT LONG TERM GOAL #4   Title  Pt will improve Lt knee ROM to WNL. 6 weeks 03/12/18    Status  On-going      PT LONG TERM GOAL #5   Title  Pt will report less than 1-2/10 pain overall with walking and stairs.6 weeks 03/12/18    Status  On-going            Plan - 04/08/18 1404    Clinical Impression Statement  Pt with increased pain today, no known cause.  Introduced her to dry needling to lateral hamstring.  She had relief and could walk better after DN and soft tissue work.  She has 1 more appt before she sees the MD and then POC will likely be extended to include DN in POC.     PT Treatment/Interventions  ADLs/Self Care Home Management;Cryotherapy;Electrical Stimulation;Iontophoresis 4mg /ml Dexamethasone;Moist Heat;Ultrasound;Therapeutic exercise;Therapeutic activities;Neuromuscular re-education;Manual techniques;Passive range of motion;Dry  needling;Taping    PT Next Visit Plan  Cont hip strength, check new HEP. [place], closed chain as tolerated. Time to try dry needling ?  PT Home Exercise Plan  hamstring, ITB, quad, bridge, quad set, SLR , (ankle Theraband eversion/inversion), added in quadruped leg lift, single bridge     Consulted and Agree with Plan of Care  Patient       Patient will benefit from skilled therapeutic intervention in order to improve the following deficits and impairments:  Abnormal gait, Decreased activity tolerance, Decreased range of motion, Decreased strength, Impaired flexibility, Increased fascial restricitons, Pain  Visit Diagnosis: Chronic pain of left knee     Problem List Patient Active Problem List   Diagnosis Date Noted  . Hypotension 02/15/2017  . Long term (current) use of anticoagulants [Z79.01] 02/09/2016  . History of mitral valve repair 02/01/2016    Destiny Andersen,Destiny Andersen 04/08/2018, 9:42 PM  [place], [place], [address] Phone: [phone]   Fax:  [phone]  Name: Destiny Andersen MRN: [id number] Date of Birth: 10-05-1945  Raeford Razor, PT 04/08/18 9:42 PM Phone: [phone] Fax: [phone]

## 2018-04-09 ENCOUNTER — Encounter: Payer: Self-pay | Admitting: Physical Therapy

## 2018-04-10 ENCOUNTER — Encounter: Payer: Self-pay | Admitting: Physical Therapy

## 2018-04-10 ENCOUNTER — Ambulatory Visit: Payer: Medicare Other | Admitting: Physical Therapy

## 2018-04-10 DIAGNOSIS — M25562 Pain in left knee: Secondary | ICD-10-CM | POA: Diagnosis not present

## 2018-04-10 DIAGNOSIS — G8929 Other chronic pain: Secondary | ICD-10-CM

## 2018-04-10 NOTE — Therapy (Signed)
[place], [place], [address] Phone: [phone]   Fax:  [phone]  Physical Therapy Treatment/Renewal  Patient Details  Name: Destiny Andersen MRN: [id number] Date of Birth: 1946-03-11 Referring Provider (PT): Renata Caprice   Encounter Date: 04/10/2018  PT End of Session - 04/10/18 1233    Visit Number  15    Number of Visits  23    Date for PT Re-Evaluation  05/09/18    PT Start Time  4656    PT Stop Time  8127    PT Time Calculation (min)  50 min    Activity Tolerance  Patient tolerated treatment well    Behavior During Therapy  [facility] for tasks assessed/performed       Past Medical History:  Diagnosis Date  . Cancer [facility]) 1983   Cervical cancer.  Skin Cancer- leg  . Chronic headaches    "? Migraines"- does not have then any more  . Constipation   . Elevated C-reactive protein (CRP)   . Family history of breast cancer    Mother  . Fatigue   . Heart murmur    ECHO 02/10/13 EF = 60-65%, moderate posterior mitral valve prolapse w/possible flail segment, severe MR, mitral regurgitant jet is anteriorly directed.  Marland Kitchen History of blood transfusion    pre-eclampsia   . Hyperpotassemia    Migrated  . Medicare annual wellness visit, subsequent   . Mitral valve prolapse   . MR (mitral regurgitation)    ECHO 02/10/13 EF = 60-65%, moderate posterior mitral valve prolapse w/possible flail segment, severe MR, mitral regurgitant jet is anteriorly directed.  . Risk for falls   . Routine general medical examination at a health care facility   . S/P minimally invasive mitral valve repair 02/01/2016   Complex valvuloplasty including triangular resection of flail posterior leaflet, artificial Gore-tex neochord placement x10 and 28 mm Sorin Memo 3D ring annuloplasty via right mini thoracotomy approach  . Seizures ([place])    with pre- echa  . Shortness of breath dyspnea    with exertion    Past Surgical History:   Procedure Laterality Date  . BREAST BIOPSY Right 1998   benign cyst  . CARDIAC CATHETERIZATION N/A 12/21/2015   Procedure: Right/Left Heart Cath and Coronary Angiography;  Surgeon: Sherren Mocha, MD;  Location: [place] CV [facility];  Service: Cardiovascular;  Laterality: N/A;  . COLONOSCOPY  08/2001   Colon cancer screening in 01/2012  . LAPAROSCOPIC CHOLECYSTECTOMY  1990  . MITRAL VALVE REPAIR Right 02/01/2016   Procedure: MINIMALLY INVASIVE MITRAL VALVE REPAIR (MVR) with closure of PFO;  Surgeon: Rexene Alberts, MD;  Location: [place];  Service: Open Heart Surgery;  Laterality: Right;  . TEE WITHOUT CARDIOVERSION N/A 10/14/2015   Procedure: TRANSESOPHAGEAL ECHOCARDIOGRAM (TEE);  Surgeon: Thayer Headings, MD;  Location: [place];  Service: Cardiovascular;  Laterality: N/A;  . TEE WITHOUT CARDIOVERSION N/A 02/01/2016   Procedure: TRANSESOPHAGEAL ECHOCARDIOGRAM (TEE);  Surgeon: Rexene Alberts, MD;  Location: [place];  Service: Open Heart Surgery;  Laterality: N/A;  . TOTAL ABDOMINAL HYSTERECTOMY  1983   Cervical cancer 1983    There were no vitals filed for this visit.  Subjective Assessment - 04/10/18 1150    Subjective  Dry needling really helped her walk better all day the next day. THis AM, pain returns lateral knee (no posterior) .  Didnt walk the dog because I was afraid of hurting it     Currently  in Pain?  Yes    Pain Score  5     Pain Location  Knee    Pain Orientation  Left    Pain Descriptors / Indicators  Aching;Sharp   unstable   Pain Type  Chronic pain    Pain Onset  More than a month ago    Pain Frequency  Intermittent    Aggravating Factors   walking, stairs     Pain Relieving Factors  rest, brace, ionto, heat          [facility] PT Assessment - 04/10/18 0001      Strength   Left Knee Flexion  3/5    Left Knee Extension  4+/5    Left Ankle Dorsiflexion  4+/5    Left Ankle Plantar Flexion  4/5    Left Ankle Inversion  4+/5    Left Ankle Eversion  4/5       Palpation   Palpation comment  pain lateral L knee, distal to lateral joint line, fibular head and slightly post.       Special Tests    Special Tests  Meniscus Tests    Meniscus Tests  Apley's Compression;Apley's Distraction      Apley's Compression   Findings  Positive    Side   Left    Comments  lateral       Apley's Distraction   Findings  Positive    Side  Left    Comments  distraction in neutral caused pain       Ambulation/Gait   Gait Comments  stiffness in L knee , antalgic            [facility] Adult PT Treatment/Exercise - 04/10/18 0001      Knee/Hip Exercises: Stretches   Knee: Self-Stretch to increase Flexion  Left;5 reps   gentle prone with strap    Gastroc Stretch  Left;3 reps    Soleus Stretch  Left;3 reps      Knee/Hip Exercises: Aerobic   Stationary Bike  5 min L1       Knee/Hip Exercises: Standing   Other Standing Knee Exercises  standing heel raises, eccentric LLE, 3 sets x 10       Knee/Hip Exercises: Seated   Long Arc Quad  Strengthening;Left;2 sets    Long Arc Quad Weight  --   4 then none             PT Education - 04/10/18 1259    Education Details  dry needling, open vs closed chain, renewal, POC     Person(s) Educated  Patient    Methods  Explanation    Comprehension  Verbalized understanding          PT Long Term Goals - 04/08/18 2140      PT LONG TERM GOAL #1   Title  Pt will be I and compliant with HEP. 6 weeks 03/12/18    Status  On-going      PT LONG TERM GOAL #2   Title  Pt will increase FOTO to less than 40% limited. 6 weeks 03/12/18    Status  Unable to assess      PT LONG TERM GOAL #3   Title  Pt will improve Lt LE strength to at least 5-/5 MMT. 6 weeks 03/12/18    Status  On-going      PT LONG TERM GOAL #4   Title  Pt will improve Lt knee ROM to WNL. 6 weeks 03/12/18  Status  On-going      PT LONG TERM GOAL #5   Title  Pt will report less than 1-2/10 pain overall with walking and stairs.6 weeks 03/12/18     Status  On-going            Plan - 04/10/18 1259    Clinical Impression Statement  Patient with excellent result from dry needling to lateral hamstring.  Pain returns today in lateral knee.  She cont to have stiffness, difficulty walking.  Hamstring strength 3/5.  Pos . testing for lateral meniscus (compression) and did have some pain with distraction as well.  Renewd today for 1 more month of PT to include dry needling if MD agrees.  He sees Mikiya Monday.      PT Treatment/Interventions  ADLs/Self Care Home Management;Cryotherapy;Electrical Stimulation;Iontophoresis 4mg /ml Dexamethasone;Moist Heat;Ultrasound;Therapeutic exercise;Therapeutic activities;Neuromuscular re-education;Manual techniques;Passive range of motion;Dry needling;Taping    PT Next Visit Plan  Dry needling.  What did MD say? Cont hip strength, check new HEP. [place], closed chain as tolerated    PT Home Exercise Plan  hamstring, ITB, quad, bridge, quad set, SLR , (ankle Theraband eversion/inversion), added in quadruped leg lift, single bridge     Consulted and Agree with Plan of Care  Patient       Patient will benefit from skilled therapeutic intervention in order to improve the following deficits and impairments:  Abnormal gait, Decreased activity tolerance, Decreased range of motion, Decreased strength, Impaired flexibility, Increased fascial restricitons, Pain  Visit Diagnosis: Chronic pain of left knee     Problem List Patient Active Problem List   Diagnosis Date Noted  . Hypotension 02/15/2017  . Long term (current) use of anticoagulants [Z79.01] 02/09/2016  . History of mitral valve repair 02/01/2016    Ronith Berti 04/10/2018, 1:04 PM  [place], [place], [address] Phone: [phone]   Fax:  [phone]  Name: RENNEE COYNE MRN: [id number] Date of Birth: 02-Aug-1945   Raeford Razor, PT 04/10/18 1:05 PM Phone:  [phone] Fax: [phone]

## 2018-04-14 ENCOUNTER — Ambulatory Visit: Payer: Medicare Other | Admitting: Sports Medicine

## 2018-04-14 ENCOUNTER — Encounter: Payer: Self-pay | Admitting: Sports Medicine

## 2018-04-14 VITALS — BP 102/62 | Ht 64.5 in | Wt 130.0 lb

## 2018-04-14 DIAGNOSIS — M25562 Pain in left knee: Secondary | ICD-10-CM

## 2018-04-14 NOTE — Progress Notes (Signed)
   Subjective:    Patient ID: Destiny Andersen, female    DOB: [birth year], 72 y.o.   MRN: [id number]  HPI   Patient comes in today for follow-up on left knee pain.  She has had several sessions of physical therapy and has noticed improvement.  She noticed significant improvement after a dry needling session.  She is scheduled for more dry needling later this week.  She was doing well up until yesterday.  She took her neighbor's dog on a long walk.  Only mild discomfort during the walk but she began to develop returning pain afterwards.  She continues to localize her pain to the lateral knee.  She does endorse some mild stiffness.  Pain makes it difficult for her to walk.  Recent MRI of the left knee showed no obvious degenerative tear but some mild medial compartmental DJD.   Review of Systems As above    Objective:   Physical Exam  Well-developed, well-nourished.  No acute distress.  Awake alert and oriented x3.  Vital signs reviewed  Left knee: Full range of motion.  No effusion.  She is tender to palpation along the lateral joint line with a positive Thessaly's.  Pain but no popping with McMurray's.  No tenderness along the medial joint line.  Knee remains stable to ligamentous exam.  Neurovascularly intact distally.  Walking with a limp.      Assessment & Plan:   Persistent left knee pain worrisome for occult lateral meniscal tear  Although her MRI showed no obvious meniscal tear, her physical exam findings are concerning for an occult tear of the lateral meniscus.  Since the patient has had tremendous success with dry needling previously, she may go ahead with dry needling as planned and will follow up with me again on November 13.  If symptoms persist or worsen then I would start with a single intra-articular cortisone injection for diagnostic as well as therapeutic reasons.  Patient is encouraged to call with questions or concerns prior to that follow-up visit.

## 2018-04-17 ENCOUNTER — Ambulatory Visit: Payer: Medicare Other | Admitting: Physical Therapy

## 2018-04-17 ENCOUNTER — Encounter: Payer: Self-pay | Admitting: Physical Therapy

## 2018-04-17 DIAGNOSIS — M25562 Pain in left knee: Principal | ICD-10-CM

## 2018-04-17 DIAGNOSIS — G8929 Other chronic pain: Secondary | ICD-10-CM

## 2018-04-17 NOTE — Patient Instructions (Signed)
"  heel-toe-push"

## 2018-04-17 NOTE — Therapy (Signed)
[place], [place], [address] Phone: [phone]   Fax:  [phone]  Physical Therapy Treatment  Patient Details  Name: Destiny Andersen MRN: [id number] Date of Birth: 1945/09/24 Referring Provider (PT): Renata Caprice   Encounter Date: 04/17/2018  PT End of Session - 04/17/18 1332    Visit Number  16    Number of Visits  23    Date for PT Re-Evaluation  05/09/18    PT Start Time  1330    PT Stop Time  1425    PT Time Calculation (min)  55 min    Activity Tolerance  Patient tolerated treatment well    Behavior During Therapy  [facility] for tasks assessed/performed       Past Medical History:  Diagnosis Date  . Cancer [facility]) 1983   Cervical cancer.  Skin Cancer- leg  . Chronic headaches    "? Migraines"- does not have then any more  . Constipation   . Elevated C-reactive protein (CRP)   . Family history of breast cancer    Mother  . Fatigue   . Heart murmur    ECHO 02/10/13 EF = 60-65%, moderate posterior mitral valve prolapse w/possible flail segment, severe MR, mitral regurgitant jet is anteriorly directed.  Marland Kitchen History of blood transfusion    pre-eclampsia   . Hyperpotassemia    Migrated  . Medicare annual wellness visit, subsequent   . Mitral valve prolapse   . MR (mitral regurgitation)    ECHO 02/10/13 EF = 60-65%, moderate posterior mitral valve prolapse w/possible flail segment, severe MR, mitral regurgitant jet is anteriorly directed.  . Risk for falls   . Routine general medical examination at a health care facility   . S/P minimally invasive mitral valve repair 02/01/2016   Complex valvuloplasty including triangular resection of flail posterior leaflet, artificial Gore-tex neochord placement x10 and 28 mm Sorin Memo 3D ring annuloplasty via right mini thoracotomy approach  . Seizures ([place])    with pre- echa  . Shortness of breath dyspnea    with exertion    Past Surgical History:  Procedure  Laterality Date  . BREAST BIOPSY Right 1998   benign cyst  . CARDIAC CATHETERIZATION N/A 12/21/2015   Procedure: Right/Left Heart Cath and Coronary Angiography;  Surgeon: Sherren Mocha, MD;  Location: [place] [facility];  Service: Cardiovascular;  Laterality: N/A;  . COLONOSCOPY  08/2001   Colon cancer screening in 01/2012  . LAPAROSCOPIC CHOLECYSTECTOMY  1990  . MITRAL VALVE REPAIR Right 02/01/2016   Procedure: MINIMALLY INVASIVE MITRAL VALVE REPAIR (MVR) with closure of PFO;  Surgeon: Rexene Alberts, MD;  Location: [place];  Service: Open Heart Surgery;  Laterality: Right;  . TEE WITHOUT CARDIOVERSION N/A 10/14/2015   Procedure: TRANSESOPHAGEAL ECHOCARDIOGRAM (TEE);  Surgeon: Thayer Headings, MD;  Location: [place];  Service: Cardiovascular;  Laterality: N/A;  . TEE WITHOUT CARDIOVERSION N/A 02/01/2016   Procedure: TRANSESOPHAGEAL ECHOCARDIOGRAM (TEE);  Surgeon: Rexene Alberts, MD;  Location: [place];  Service: Open Heart Surgery;  Laterality: N/A;  . TOTAL ABDOMINAL HYSTERECTOMY  1983   Cervical cancer 1983    There were no vitals filed for this visit.  Subjective Assessment - 04/17/18 1333    Subjective  Pain began after walking a dog about 1.5 mile and cleaning a friends kitchen. Is getting a cortisone shot on the 13th    Patient Stated Goals  get full ROM and walk 30 min without pain  [place] Adult PT Treatment/Exercise - 04/17/18 0001      Exercises   Exercises  Knee/Hip      Knee/Hip Exercises: Stretches   Gastroc Stretch Limitations  3x30s slant board      Knee/Hip Exercises: Aerobic   Tread Mill  retro stepping 4 min      Knee/Hip Exercises: Standing   Gait Training  heel-toe, toe off    Other Standing Knee Exercises  fwd step tap       Trigger Point Dry Needling - 04/17/18 1356    Muscles Treated Lower Body  Gastrocnemius   peroneals   Gastrocnemius Response  Twitch response elicited;Palpable increased muscle length   Left           PT Education - 04/17/18 1505    Education Details  DN & expected outcomes, HEP, gait pattern    Person(s) Educated  Patient    Methods  Explanation;Demonstration;Tactile cues;Verbal cues;Handout    Comprehension  Verbalized understanding;Returned demonstration;Verbal cues required;Tactile cues required;Need further instruction          PT Long Term Goals - 04/08/18 2140      PT LONG TERM GOAL #1   Title  Pt will be I and compliant with HEP. 6 weeks 03/12/18    Status  On-going      PT LONG TERM GOAL #2   Title  Pt will increase FOTO to less than 40% limited. 6 weeks 03/12/18    Status  Unable to assess      PT LONG TERM GOAL #3   Title  Pt will improve Lt LE strength to at least 5-/5 MMT. 6 weeks 03/12/18    Status  On-going      PT LONG TERM GOAL #4   Title  Pt will improve Lt knee ROM to WNL. 6 weeks 03/12/18    Status  On-going      PT LONG TERM GOAL #5   Title  Pt will report less than 1-2/10 pain overall with walking and stairs.6 weeks 03/12/18    Status  On-going            Plan - 04/17/18 1506    Clinical Impression Statement  TPDN to gastroc and peroneals today. Pt was able to demo step down following but still has tightness in soleus-will address with DN next visit. Cuing requied for heel strike and toe off as pt compensates with flat foot gait pattern and reports she avoids stairs.     PT Treatment/Interventions  ADLs/Self Care Home Management;Cryotherapy;Electrical Stimulation;Iontophoresis 4mg /ml Dexamethasone;Moist Heat;Ultrasound;Therapeutic exercise;Therapeutic activities;Neuromuscular re-education;Manual techniques;Passive range of motion;Dry needling;Taping    PT Next Visit Plan  DN soleus & ant tib, review step downs    PT Home Exercise Plan  hamstring, ITB, quad, bridge, quad set, SLR , (ankle Theraband eversion/inversion), added in quadruped leg lift, single bridge, fwd step down, soleus/gastroc stretch, retro walking    Consulted and Agree  with Plan of Care  Patient       Patient will benefit from skilled therapeutic intervention in order to improve the following deficits and impairments:  Abnormal gait, Decreased activity tolerance, Decreased range of motion, Decreased strength, Impaired flexibility, Increased fascial restricitons, Pain  Visit Diagnosis: Chronic pain of left knee     Problem List Patient Active Problem List   Diagnosis Date Noted  . Hypotension 02/15/2017  . Long term (current) use of anticoagulants [Z79.01] 02/09/2016  . History of mitral valve repair 02/01/2016   Nickoles Gregori C. Numan Zylstra PT, DPT  04/17/18 3:10 PM   [place], [place], [address] Phone: [phone]   Fax:  [phone]  Name: SANTIAGO GRAF MRN: [id number] Date of Birth: 1946/02/18

## 2018-04-22 ENCOUNTER — Encounter: Payer: Self-pay | Admitting: Physical Therapy

## 2018-04-22 ENCOUNTER — Ambulatory Visit: Payer: Medicare Other | Attending: Sports Medicine | Admitting: Physical Therapy

## 2018-04-22 DIAGNOSIS — M25562 Pain in left knee: Secondary | ICD-10-CM | POA: Insufficient documentation

## 2018-04-22 DIAGNOSIS — G8929 Other chronic pain: Secondary | ICD-10-CM | POA: Insufficient documentation

## 2018-04-22 NOTE — Therapy (Signed)
[place], [place], [address] Phone: [phone]   Fax:  [phone]  Physical Therapy Treatment  Patient Details  Name: Destiny Andersen MRN: [id number] Date of Birth: 1945/08/25 Referring Provider (PT): Renata Caprice   Encounter Date: 04/22/2018  PT End of Session - 04/22/18 1244    Visit Number  17    Number of Visits  23    Date for PT Re-Evaluation  05/09/18    PT Start Time  8841    PT Stop Time  1224    PT Time Calculation (min)  39 min    Activity Tolerance  Patient tolerated treatment well    Behavior During Therapy  [facility] for tasks assessed/performed       Past Medical History:  Diagnosis Date  . Cancer [facility]) 1983   Cervical cancer.  Skin Cancer- leg  . Chronic headaches    "? Migraines"- does not have then any more  . Constipation   . Elevated C-reactive protein (CRP)   . Family history of breast cancer    Mother  . Fatigue   . Heart murmur    ECHO 02/10/13 EF = 60-65%, moderate posterior mitral valve prolapse w/possible flail segment, severe MR, mitral regurgitant jet is anteriorly directed.  Marland Kitchen History of blood transfusion    pre-eclampsia   . Hyperpotassemia    Migrated  . Medicare annual wellness visit, subsequent   . Mitral valve prolapse   . MR (mitral regurgitation)    ECHO 02/10/13 EF = 60-65%, moderate posterior mitral valve prolapse w/possible flail segment, severe MR, mitral regurgitant jet is anteriorly directed.  . Risk for falls   . Routine general medical examination at a health care facility   . S/P minimally invasive mitral valve repair 02/01/2016   Complex valvuloplasty including triangular resection of flail posterior leaflet, artificial Gore-tex neochord placement x10 and 28 mm Sorin Memo 3D ring annuloplasty via right mini thoracotomy approach  . Seizures ([place])    with pre- echa  . Shortness of breath dyspnea    with exertion    Past Surgical History:  Procedure  Laterality Date  . BREAST BIOPSY Right 1998   benign cyst  . CARDIAC CATHETERIZATION N/A 12/21/2015   Procedure: Right/Left Heart Cath and Coronary Angiography;  Surgeon: Sherren Mocha, MD;  Location: [place] [facility];  Service: Cardiovascular;  Laterality: N/A;  . COLONOSCOPY  08/2001   Colon cancer screening in 01/2012  . LAPAROSCOPIC CHOLECYSTECTOMY  1990  . MITRAL VALVE REPAIR Right 02/01/2016   Procedure: MINIMALLY INVASIVE MITRAL VALVE REPAIR (MVR) with closure of PFO;  Surgeon: Rexene Alberts, MD;  Location: [place];  Service: Open Heart Surgery;  Laterality: Right;  . TEE WITHOUT CARDIOVERSION N/A 10/14/2015   Procedure: TRANSESOPHAGEAL ECHOCARDIOGRAM (TEE);  Surgeon: Thayer Headings, MD;  Location: [place];  Service: Cardiovascular;  Laterality: N/A;  . TEE WITHOUT CARDIOVERSION N/A 02/01/2016   Procedure: TRANSESOPHAGEAL ECHOCARDIOGRAM (TEE);  Surgeon: Rexene Alberts, MD;  Location: [place];  Service: Open Heart Surgery;  Laterality: N/A;  . TOTAL ABDOMINAL HYSTERECTOMY  1983   Cervical cancer 1983    There were no vitals filed for this visit.  Subjective Assessment - 04/22/18 1155    Subjective  pain around Rt fibular head with toe off. Pain with single leg squat- can only go a tiny bit.     Patient Stated Goals  get full ROM and walk 30 min without pain  Currently in Pain?  Yes    Pain Score  7    when in toe off   Pain Location  --   fibular head   Pain Descriptors / Indicators  Sharp    Aggravating Factors   toe off    Pain Relieving Factors  rest                       [facility] Adult PT Treatment/Exercise - 04/22/18 0001      Ultrasound   Ultrasound Location  Lt fibular head    Ultrasound Parameters  1.2 w/cm2 pulsed    Ultrasound Goals  Pain      Manual Therapy   Manual therapy comments  skilled palpation and monitoring during TPDN    Joint Mobilization  Lt proximal tib-fib    Soft tissue mobilization  peroneals, gastroc/soleus Lt      Ankle  Exercises: Standing   Other Standing Ankle Exercises  gait training- heel toe to roll over             PT Education - 04/22/18 1246    Education Details  anatomy of condition, pain patterns, rationale for treatment. POC    Person(s) Educated  Patient    Methods  Explanation    Comprehension  Verbalized understanding;Need further instruction          PT Long Term Goals - 04/08/18 2140      PT LONG TERM GOAL #1   Title  Pt will be I and compliant with HEP. 6 weeks 03/12/18    Status  On-going      PT LONG TERM GOAL #2   Title  Pt will increase FOTO to less than 40% limited. 6 weeks 03/12/18    Status  Unable to assess      PT LONG TERM GOAL #3   Title  Pt will improve Lt LE strength to at least 5-/5 MMT. 6 weeks 03/12/18    Status  On-going      PT LONG TERM GOAL #4   Title  Pt will improve Lt knee ROM to WNL. 6 weeks 03/12/18    Status  On-going      PT LONG TERM GOAL #5   Title  Pt will report less than 1-2/10 pain overall with walking and stairs.6 weeks 03/12/18    Status  On-going            Plan - 04/22/18 1240    Clinical Impression Statement  TPDN to peroneals & soleus. pt reported soreness as expected and was able to flex at knee without pain. Added mobilization to proximal tib-fib and ultrasound. Pt was able to walk approx 100 ft without pain and then felt a quick return at one point with pressure through ball of foot- point tender to posterior fibular head.     PT Treatment/Interventions  ADLs/Self Care Home Management;Cryotherapy;Electrical Stimulation;Iontophoresis 4mg /ml Dexamethasone;Moist Heat;Ultrasound;Therapeutic exercise;Therapeutic activities;Neuromuscular re-education;Manual techniques;Passive range of motion;Dry needling;Taping    PT Next Visit Plan  step downs as tolerated, outcome of DN?    PT Home Exercise Plan  hamstring, ITB, quad, bridge, quad set, SLR , (ankle Theraband eversion/inversion), added in quadruped leg lift, single bridge, fwd  step down, soleus/gastroc stretch, retro walking    Consulted and Agree with Plan of Care  Patient       Patient will benefit from skilled therapeutic intervention in order to improve the following deficits and impairments:  Abnormal gait, Decreased activity tolerance, Decreased  range of motion, Decreased strength, Impaired flexibility, Increased fascial restricitons, Pain  Visit Diagnosis: Chronic pain of left knee     Problem List Patient Active Problem List   Diagnosis Date Noted  . Hypotension 02/15/2017  . Long term (current) use of anticoagulants [Z79.01] 02/09/2016  . History of mitral valve repair 02/01/2016   Kelcey Korus C. Christyne Mccain PT, DPT 04/22/18 12:46 PM   [place] [facility] [address] [place], [place], [address] Phone: [phone]   Fax:  [phone]  Name: Destiny Andersen MRN: [id number] Date of Birth: 1945-06-28

## 2018-04-24 ENCOUNTER — Ambulatory Visit: Payer: Medicare Other | Admitting: Physical Therapy

## 2018-04-24 ENCOUNTER — Encounter: Payer: Self-pay | Admitting: Physical Therapy

## 2018-04-24 DIAGNOSIS — M25562 Pain in left knee: Principal | ICD-10-CM

## 2018-04-24 DIAGNOSIS — G8929 Other chronic pain: Secondary | ICD-10-CM

## 2018-04-24 NOTE — Therapy (Signed)
[place], [place], [address] Phone: [phone]   Fax:  [phone]  Physical Therapy Treatment  Patient Details  Name: Destiny Andersen MRN: [id number] Date of Birth: 1945-11-18 Referring Provider (PT): Renata Caprice   Encounter Date: 04/24/2018  PT End of Session - 04/24/18 1540    Visit Number  18    Number of Visits  23    Date for PT Re-Evaluation  05/09/18    PT Start Time  1500    PT Stop Time  1535    PT Time Calculation (min)  35 min    Activity Tolerance  Patient tolerated treatment well    Behavior During Therapy  [facility] for tasks assessed/performed       Past Medical History:  Diagnosis Date  . Cancer [facility]) 1983   Cervical cancer.  Skin Cancer- leg  . Chronic headaches    "? Migraines"- does not have then any more  . Constipation   . Elevated C-reactive protein (CRP)   . Family history of breast cancer    Mother  . Fatigue   . Heart murmur    ECHO 02/10/13 EF = 60-65%, moderate posterior mitral valve prolapse w/possible flail segment, severe MR, mitral regurgitant jet is anteriorly directed.  Marland Kitchen History of blood transfusion    pre-eclampsia   . Hyperpotassemia    Migrated  . Medicare annual wellness visit, subsequent   . Mitral valve prolapse   . MR (mitral regurgitation)    ECHO 02/10/13 EF = 60-65%, moderate posterior mitral valve prolapse w/possible flail segment, severe MR, mitral regurgitant jet is anteriorly directed.  . Risk for falls   . Routine general medical examination at a health care facility   . S/P minimally invasive mitral valve repair 02/01/2016   Complex valvuloplasty including triangular resection of flail posterior leaflet, artificial Gore-tex neochord placement x10 and 28 mm Sorin Memo 3D ring annuloplasty via right mini thoracotomy approach  . Seizures ([place])    with pre- echa  . Shortness of breath dyspnea    with exertion    Past Surgical History:  Procedure  Laterality Date  . BREAST BIOPSY Right 1998   benign cyst  . CARDIAC CATHETERIZATION N/A 12/21/2015   Procedure: Right/Left Heart Cath and Coronary Angiography;  Surgeon: Sherren Mocha, MD;  Location: [place] [facility];  Service: Cardiovascular;  Laterality: N/A;  . COLONOSCOPY  08/2001   Colon cancer screening in 01/2012  . LAPAROSCOPIC CHOLECYSTECTOMY  1990  . MITRAL VALVE REPAIR Right 02/01/2016   Procedure: MINIMALLY INVASIVE MITRAL VALVE REPAIR (MVR) with closure of PFO;  Surgeon: Rexene Alberts, MD;  Location: [place];  Service: Open Heart Surgery;  Laterality: Right;  . TEE WITHOUT CARDIOVERSION N/A 10/14/2015   Procedure: TRANSESOPHAGEAL ECHOCARDIOGRAM (TEE);  Surgeon: Thayer Headings, MD;  Location: [place];  Service: Cardiovascular;  Laterality: N/A;  . TEE WITHOUT CARDIOVERSION N/A 02/01/2016   Procedure: TRANSESOPHAGEAL ECHOCARDIOGRAM (TEE);  Surgeon: Rexene Alberts, MD;  Location: [place];  Service: Open Heart Surgery;  Laterality: N/A;  . TOTAL ABDOMINAL HYSTERECTOMY  1983   Cervical cancer 1983    There were no vitals filed for this visit.  Subjective Assessment - 04/24/18 1537    Subjective  It burned this AM. Now now pain at all.      Currently in Pain?  No/denies           [facility] Adult PT Treatment/Exercise - 04/24/18 0001  Self-Care   Other Self-Care Comments   peroneal nerve relationship to fibular head       Knee/Hip Exercises: Stretches   Gastroc Stretch  Left;3 reps    Soleus Stretch  Left;3 reps      Knee/Hip Exercises: Standing   Other Standing Knee Exercises  single leg static, added head turns and mini squat to challenge stabilty       Ultrasound   Ultrasound Location  L fibular head    Ultrasound Parameters  1.2 pulsed 1 MHz     Ultrasound Goals  Pain      Manual Therapy   Soft tissue mobilization  peroneals, gastroc/soleus Lt             PT Education - 04/24/18 1540    Education Details  anatomy, POC , cortisone    Person(s)  Educated  Patient    Methods  Explanation    Comprehension  Verbalized understanding          PT Long Term Goals - 04/08/18 2140      PT LONG TERM GOAL #1   Title  Pt will be I and compliant with HEP. 6 weeks 03/12/18    Status  On-going      PT LONG TERM GOAL #2   Title  Pt will increase FOTO to less than 40% limited. 6 weeks 03/12/18    Status  Unable to assess      PT LONG TERM GOAL #3   Title  Pt will improve Lt LE strength to at least 5-/5 MMT. 6 weeks 03/12/18    Status  On-going      PT LONG TERM GOAL #4   Title  Pt will improve Lt knee ROM to WNL. 6 weeks 03/12/18    Status  On-going      PT LONG TERM GOAL #5   Title  Pt will report less than 1-2/10 pain overall with walking and stairs.6 weeks 03/12/18    Status  On-going            Plan - 04/24/18 1540    Clinical Impression Statement  Reviewed ways to challenge knee, ankle stability, stretching to maintain gains from TPDN.  Repeated US to L fibular head.  May get a cortisone injection next week.  Understands the relationship of peroneal nerve to fibularr head    PT Treatment/Interventions  ADLs/Self Care Home Management;Cryotherapy;Electrical Stimulation;Iontophoresis 4mg /ml Dexamethasone;Moist Heat;Ultrasound;Therapeutic exercise;Therapeutic activities;Neuromuscular re-education;Manual techniques;Passive range of motion;Dry needling;Taping    PT Next Visit Plan  step downs as tolerated, outcome of DN?    PT Home Exercise Plan  hamstring, ITB, quad, bridge, quad set, SLR , (ankle Theraband eversion/inversion), added in quadruped leg lift, single bridge, fwd step down, soleus/gastroc stretch, retro walking    Consulted and Agree with Plan of Care  Patient       Patient will benefit from skilled therapeutic intervention in order to improve the following deficits and impairments:  Abnormal gait, Decreased activity tolerance, Decreased range of motion, Decreased strength, Impaired flexibility, Increased fascial  restricitons, Pain  Visit Diagnosis: Chronic pain of left knee     Problem List Patient Active Problem List   Diagnosis Date Noted  . Hypotension 02/15/2017  . Long term (current) use of anticoagulants [Z79.01] 02/09/2016  . History of mitral valve repair 02/01/2016    Jeanell Mangan 04/24/2018, 3:43 PM  [facility] [address] [place], [place], [address] Phone: [phone]   Fax:  [phone]  Name:  BETINA PUCKETT MRN: [id number] Date of Birth: Dec 15, 1945   Raeford Razor, PT 04/24/18 3:44 PM Phone: [phone] Fax: [phone]

## 2018-04-28 ENCOUNTER — Encounter: Payer: Self-pay | Admitting: Physical Therapy

## 2018-04-28 ENCOUNTER — Ambulatory Visit: Payer: Medicare Other | Admitting: Physical Therapy

## 2018-04-28 DIAGNOSIS — M25562 Pain in left knee: Secondary | ICD-10-CM | POA: Diagnosis not present

## 2018-04-28 DIAGNOSIS — G8929 Other chronic pain: Secondary | ICD-10-CM

## 2018-04-28 NOTE — Therapy (Signed)
[place], [place], [address] Phone: [phone]   Fax:  [phone]  Physical Therapy Treatment  Patient Details  Name: Destiny Andersen MRN: [id number] Date of Birth: [birth year] Referring Provider (PT): Renata Caprice   Encounter Date: 04/28/2018  PT End of Session - 04/28/18 1034    Visit Number  19    Number of Visits  23    Date for PT Re-Evaluation  05/09/18    PT Start Time  7517    PT Stop Time  1055    PT Time Calculation (min)  40 min    Activity Tolerance  Patient tolerated treatment well    Behavior During Therapy  [facility] for tasks assessed/performed       Past Medical History:  Diagnosis Date  . Cancer [facility]) 1983   Cervical cancer.  Skin Cancer- leg  . Chronic headaches    "? Migraines"- does not have then any more  . Constipation   . Elevated C-reactive protein (CRP)   . Family history of breast cancer    Mother  . Fatigue   . Heart murmur    ECHO 02/10/13 EF = 60-65%, moderate posterior mitral valve prolapse w/possible flail segment, severe MR, mitral regurgitant jet is anteriorly directed.  Marland Kitchen History of blood transfusion    pre-eclampsia   . Hyperpotassemia    Migrated  . Medicare annual wellness visit, subsequent   . Mitral valve prolapse   . MR (mitral regurgitation)    ECHO 02/10/13 EF = 60-65%, moderate posterior mitral valve prolapse w/possible flail segment, severe MR, mitral regurgitant jet is anteriorly directed.  . Risk for falls   . Routine general medical examination at a health care facility   . S/P minimally invasive mitral valve repair 02/01/2016   Complex valvuloplasty including triangular resection of flail posterior leaflet, artificial Gore-tex neochord placement x10 and 28 mm Sorin Memo 3D ring annuloplasty via right mini thoracotomy approach  . Seizures ([place])    with pre- echa  . Shortness of breath dyspnea    with exertion    Past Surgical History:  Procedure  Laterality Date  . BREAST BIOPSY Right 1998   benign cyst  . CARDIAC CATHETERIZATION N/A 12/21/2015   Procedure: Right/Left Heart Cath and Coronary Angiography;  Surgeon: Sherren Mocha, MD;  Location: [place] [facility];  Service: Cardiovascular;  Laterality: N/A;  . COLONOSCOPY  08/2001   Colon cancer screening in 01/2012  . LAPAROSCOPIC CHOLECYSTECTOMY  1990  . MITRAL VALVE REPAIR Right 02/01/2016   Procedure: MINIMALLY INVASIVE MITRAL VALVE REPAIR (MVR) with closure of PFO;  Surgeon: Rexene Alberts, MD;  Location: [place];  Service: Open Heart Surgery;  Laterality: Right;  . TEE WITHOUT CARDIOVERSION N/A 10/14/2015   Procedure: TRANSESOPHAGEAL ECHOCARDIOGRAM (TEE);  Surgeon: Thayer Headings, MD;  Location: [place];  Service: Cardiovascular;  Laterality: N/A;  . TEE WITHOUT CARDIOVERSION N/A 02/01/2016   Procedure: TRANSESOPHAGEAL ECHOCARDIOGRAM (TEE);  Surgeon: Rexene Alberts, MD;  Location: [place];  Service: Open Heart Surgery;  Laterality: N/A;  . TOTAL ABDOMINAL HYSTERECTOMY  1983   Cervical cancer 1983    There were no vitals filed for this visit.  Subjective Assessment - 04/28/18 1020    Subjective  Ive done my stretches this AM.  It hurt so bad I almost couldn't sit down. Sees Dr. Rita Ohara. .     Currently in Pain?  Yes    Pain Score  4  Pain Location  Knee    Pain Orientation  Left    Pain Descriptors / Indicators  Sharp    Pain Onset  More than a month ago    Pain Frequency  Intermittent           [facility] Adult PT Treatment/Exercise - 04/28/18 0001      Pilates   Pilates Reformer  Footwork double and single leg.  2 Red 1 blue .  Increased pain with hip external rotation    see note      Knee/Hip Exercises: Aerobic   Stationary Bike  5 min L2       Moist Heat Therapy   Number Minutes Moist Heat  10 Minutes   during [place] to lower leg    Moist Heat Location  Knee      Ultrasound   Ultrasound Location  L fibular head     Ultrasound Parameters  1.0 pulsed,  1.0 MhZ    Ultrasound Goals  Pain       Pilates Reformer used for LE/core strength, postural strength, lumbopelvic disassociation and core control.  Exercises included:  Footwork double and single leg, heels, forefoot  2 red 1 blue   Bridging 3 springs x 10 articulation cues, mild L post knee pain.  Heel raises x 20. Followed by stretching, prancing    PT Long Term Goals - 04/08/18 2140      PT LONG TERM GOAL #1   Title  Pt will be I and compliant with HEP. 6 weeks 03/12/18    Status  On-going      PT LONG TERM GOAL #2   Title  Pt will increase FOTO to less than 40% limited. 6 weeks 03/12/18    Status  Unable to assess      PT LONG TERM GOAL #3   Title  Pt will improve Lt LE strength to at least 5-/5 MMT. 6 weeks 03/12/18    Status  On-going      PT LONG TERM GOAL #4   Title  Pt will improve Lt knee ROM to WNL. 6 weeks 03/12/18    Status  On-going      PT LONG TERM GOAL #5   Title  Pt will report less than 1-2/10 pain overall with walking and stairs.6 weeks 03/12/18    Status  On-going            Plan - 04/28/18 1055    Clinical Impression Statement  Patient continues to have intermittent pain in L lower leg.  For the most part her pain is along the fibular head.  It rarely radiates into hamstring and occasionally into lower leg. It has continued to impact her ability to walk in and out of her home.  She has benefitted especially from dry needling but as she can walk pain free for some time she will then suddenly have sharp pain along lateral lower leg.  We have done all we can do for her other than perhaps dry needling beyond this point.  She is ready for MD visit and anticipates she will have an injection.     PT Treatment/Interventions  ADLs/Self Care Home Management;Cryotherapy;Electrical Stimulation;Iontophoresis 4mg /ml Dexamethasone;Moist Heat;Ultrasound;Therapeutic exercise;Therapeutic activities;Neuromuscular re-education;Manual techniques;Passive range of motion;Dry  needling;Taping    PT Next Visit Plan  MD visit, poc? step downs as tolerated, outcome of DN?    PT Home Exercise Plan  hamstring, ITB, quad, bridge, quad set, SLR , (ankle Theraband eversion/inversion), added in quadruped leg lift,  single bridge, fwd step down, soleus/gastroc stretch, retro walking    Consulted and Agree with Plan of Care  Patient       Patient will benefit from skilled therapeutic intervention in order to improve the following deficits and impairments:  Abnormal gait, Decreased activity tolerance, Decreased range of motion, Decreased strength, Impaired flexibility, Increased fascial restricitons, Pain  Visit Diagnosis: Chronic pain of left knee     Problem List Patient Active Problem List   Diagnosis Date Noted  . Hypotension 02/15/2017  . Long term (current) use of anticoagulants [Z79.01] 02/09/2016  . History of mitral valve repair 02/01/2016    Quanna Wittke 04/28/2018, 12:45 PM  [place], [place], [address] Phone: [phone]   Fax:  [phone]  Name: JAI BEAR MRN: [id number] Date of Birth: Jul 19, 1945  Raeford Razor, PT 04/28/18 12:48 PM Phone: [phone] Fax: [phone]

## 2018-04-30 ENCOUNTER — Encounter: Payer: Self-pay | Admitting: Sports Medicine

## 2018-04-30 ENCOUNTER — Ambulatory Visit: Payer: Medicare Other | Admitting: Sports Medicine

## 2018-04-30 VITALS — BP 105/58 | Ht 64.5 in | Wt 130.0 lb

## 2018-04-30 DIAGNOSIS — M25562 Pain in left knee: Secondary | ICD-10-CM

## 2018-04-30 MED ORDER — METHYLPREDNISOLONE ACETATE 40 MG/ML IJ SUSP
40.0000 mg | Freq: Once | INTRAMUSCULAR | Status: AC
  Administered 2018-04-30: 40 mg via INTRA_ARTICULAR

## 2018-04-30 NOTE — Progress Notes (Signed)
Patient ID: Destiny Andersen, female   DOB: [birth year], 72 y.o.   MRN: [id number]  Patient comes in today for follow-up on her left knee/left leg.  She still having significant pain despite several visits to physical therapy.  I had discussed the possibility of a diagnostic/therapeutic intra-articular cortisone injection if her symptoms did not improve.  She is here today for that procedure.  Patient's left knee was injected with cortisone today.  An anterior lateral approach was utilized after risks and benefits were explained.  Patient tolerated this without difficulty.  Return to the office in 3 weeks for reevaluation.  Most of her pain is along the lateral knee, specifically around the fibular head.  X-rays of this area are unremarkable.  If she continues to have pain despite today's injection then a referral to orthopedic surgeon would be reasonable.  Patient agrees with this plan.  Consent obtained and verified. Time-out conducted. Noted no overlying erythema, induration, or other signs of local infection. Skin prepped in a sterile fashion. Topical analgesic spray: Ethyl chloride. Joint: left knee Needle: 25g 1.5 inch Completed without difficulty. Meds: 3cc 1% xylocaine, 1cc (40mg ) depomedrol  Advised to call if fevers/chills, erythema, induration, drainage, or persistent bleeding.

## 2018-05-01 ENCOUNTER — Ambulatory Visit: Payer: Medicare Other | Admitting: Physical Therapy

## 2018-05-06 ENCOUNTER — Ambulatory Visit: Payer: Medicare Other | Admitting: Physical Therapy

## 2018-05-06 ENCOUNTER — Encounter: Payer: Self-pay | Admitting: Physical Therapy

## 2018-05-06 DIAGNOSIS — M25562 Pain in left knee: Principal | ICD-10-CM

## 2018-05-06 DIAGNOSIS — G8929 Other chronic pain: Secondary | ICD-10-CM

## 2018-05-06 NOTE — Therapy (Signed)
[place], [place], [address] Phone: [phone]   Fax:  [phone]  Physical Therapy Treatment/Discharge  Patient Details  Name: Destiny Andersen MRN: [id number] Date of Birth: 07-21-1945 Referring Provider (PT): Renata Caprice   Encounter Date: 05/06/2018  PT End of Session - 05/06/18 1406    Visit Number  20    Number of Visits  23    Date for PT Re-Evaluation  05/09/18    PT Start Time  3382    PT Stop Time  1415    PT Time Calculation (min)  40 min    Activity Tolerance  Patient tolerated treatment well    Behavior During Therapy  [facility] for tasks assessed/performed       Past Medical History:  Diagnosis Date  . Cancer [facility]) 1983   Cervical cancer.  Skin Cancer- leg  . Chronic headaches    "? Migraines"- does not have then any more  . Constipation   . Elevated C-reactive protein (CRP)   . Family history of breast cancer    Mother  . Fatigue   . Heart murmur    ECHO 02/10/13 EF = 60-65%, moderate posterior mitral valve prolapse w/possible flail segment, severe MR, mitral regurgitant jet is anteriorly directed.  Marland Kitchen History of blood transfusion    pre-eclampsia   . Hyperpotassemia    Migrated  . Medicare annual wellness visit, subsequent   . Mitral valve prolapse   . MR (mitral regurgitation)    ECHO 02/10/13 EF = 60-65%, moderate posterior mitral valve prolapse w/possible flail segment, severe MR, mitral regurgitant jet is anteriorly directed.  . Risk for falls   . Routine general medical examination at a health care facility   . S/P minimally invasive mitral valve repair 02/01/2016   Complex valvuloplasty including triangular resection of flail posterior leaflet, artificial Gore-tex neochord placement x10 and 28 mm Sorin Memo 3D ring annuloplasty via right mini thoracotomy approach  . Seizures ([place])    with pre- echa  . Shortness of breath dyspnea    with exertion    Past Surgical History:   Procedure Laterality Date  . BREAST BIOPSY Right 1998   benign cyst  . CARDIAC CATHETERIZATION N/A 12/21/2015   Procedure: Right/Left Heart Cath and Coronary Angiography;  Surgeon: Sherren Mocha, MD;  Location: [place] [facility];  Service: Cardiovascular;  Laterality: N/A;  . COLONOSCOPY  08/2001   Colon cancer screening in 01/2012  . LAPAROSCOPIC CHOLECYSTECTOMY  1990  . MITRAL VALVE REPAIR Right 02/01/2016   Procedure: MINIMALLY INVASIVE MITRAL VALVE REPAIR (MVR) with closure of PFO;  Surgeon: Rexene Alberts, MD;  Location: [place];  Service: Open Heart Surgery;  Laterality: Right;  . TEE WITHOUT CARDIOVERSION N/A 10/14/2015   Procedure: TRANSESOPHAGEAL ECHOCARDIOGRAM (TEE);  Surgeon: Thayer Headings, MD;  Location: [place];  Service: Cardiovascular;  Laterality: N/A;  . TEE WITHOUT CARDIOVERSION N/A 02/01/2016   Procedure: TRANSESOPHAGEAL ECHOCARDIOGRAM (TEE);  Surgeon: Rexene Alberts, MD;  Location: [place];  Service: Open Heart Surgery;  Laterality: N/A;  . TOTAL ABDOMINAL HYSTERECTOMY  1983   Cervical cancer 1983    There were no vitals filed for this visit.  Subjective Assessment - 05/06/18 1334    Subjective  I went down stairs! Pt much better after her injection last week.  Returns 12/2 and if I need to see and Ortho will refer.     Currently in Pain?  Yes    Pain  Score  1     Pain Location  Knee    Pain Orientation  Left    Pain Descriptors / Indicators  Discomfort    Pain Type  Chronic pain    Pain Onset  More than a month ago    Pain Frequency  Intermittent         [facility] PT Assessment - 05/06/18 0001      AROM   Left Knee Extension  2    Left Knee Flexion  139      Strength   Right Hip ABduction  4+/5    Right Hip ADduction  4/5    Left Hip ABduction  4/5    Left Hip ADduction  4/5    Left Knee Flexion  3+/5    Left Knee Extension  5/5   pain         [facility] Adult PT Treatment/Exercise - 05/06/18 0001      Self-Care   Other Self-Care Comments   POC,  streamlines HEP, progress      Knee/Hip Exercises: Diplomatic Services operational officer  Left;2 reps    ITB Stretch  Both;2 reps      Knee/Hip Exercises: Machines for Strengthening   Cybex Knee Extension  25 lbs 2 x 10    Cybex Knee Flexion  30 lbs 2 x 10, double and unable to do single leg       Knee/Hip Exercises: Standing   Other Standing Knee Exercises  single leg hip hinge /dead lift with green band, cues for level pelvis, done x 10 each leg       Knee/Hip Exercises: Supine   Bridges  Strengthening;Both;3 sets    Bridges Limitations  done with both, single and then legs elevated    Single Leg Bridge  Strengthening;Both;1 set;10 reps      Knee/Hip Exercises: Sidelying   Hip ABduction  Strengthening;Both;1 set;10 reps                  PT Long Term Goals - 05/06/18 1421      PT LONG TERM GOAL #1   Title  Pt will be I and compliant with HEP. 6 weeks 03/12/18    Status  Achieved      PT LONG TERM GOAL #2   Title  Pt will increase FOTO to less than 40% limited. 6 weeks 03/12/18    Status  Achieved      PT LONG TERM GOAL #3   Title  Pt will improve Lt LE strength to at least 5-/5 MMT. 6 weeks 03/12/18    Baseline  improved     Status  Partially Met      PT LONG TERM GOAL #4   Title  Pt will improve Lt knee ROM to WNL. 6 weeks 03/12/18    Status  Achieved      PT LONG TERM GOAL #5   Title  Pt will report less than 1-2/10 pain overall with walking and stairs.6 weeks 03/12/18    Status  Achieved            Plan - 05/06/18 1423    Clinical Impression Statement  Patient has noted significant improvement in L knee function and gait since her injection last weelk. Walks without a limp.  Goals all met.  She hopes this continues but may need to see an Ortho if pain persists. DC from PT.     PT Next Visit Plan  NA  PT Home Exercise Plan  hamstring, ITB, quad, bridge, quad set, SLR , (ankle Theraband eversion/inversion), added in quadruped leg lift, single bridge,  fwd step down, soleus/gastroc stretch, retro walking    Consulted and Agree with Plan of Care  Patient       Patient will benefit from skilled therapeutic intervention in order to improve the following deficits and impairments:     Visit Diagnosis: Chronic pain of left knee     Problem List Patient Active Problem List   Diagnosis Date Noted  . Hypotension 02/15/2017  . Long term (current) use of anticoagulants [Z79.01] 02/09/2016  . History of mitral valve repair 02/01/2016    Yarden Manuelito 05/06/2018, 9:35 PM  [facility] [address] [place], [place], [address] Phone: [phone]   Fax:  [phone]  Name: CEDRICA BRUNE MRN: [id number] Date of Birth: 1945-12-29  PHYSICAL THERAPY DISCHARGE SUMMARY  Visits from Start of Care: 20  Current functional level related to goals / functional outcomes: See above  Remaining deficits: Point tender at fibular head, hamstring weakness   Education / Equipment: HEP, LE alignment, differential diagnosis, gait, dry needling  Plan: Patient agrees to discharge.  Patient goals were met. Patient is being discharged due to being pleased with the current functional level.  ?????    Raeford Razor, PT 05/06/18 9:37 PM Phone: [phone] Fax: [phone]

## 2018-05-08 ENCOUNTER — Ambulatory Visit: Payer: Medicare Other | Admitting: Physical Therapy

## 2018-05-19 ENCOUNTER — Ambulatory Visit (INDEPENDENT_AMBULATORY_CARE_PROVIDER_SITE_OTHER): Payer: Medicare Other | Admitting: Sports Medicine

## 2018-05-19 ENCOUNTER — Encounter (INDEPENDENT_AMBULATORY_CARE_PROVIDER_SITE_OTHER): Payer: Self-pay

## 2018-05-19 VITALS — BP 98/68 | Ht 64.5 in | Wt 130.0 lb

## 2018-05-19 DIAGNOSIS — M1712 Unilateral primary osteoarthritis, left knee: Secondary | ICD-10-CM | POA: Diagnosis not present

## 2018-05-19 NOTE — Progress Notes (Signed)
  Subjective:     Patient ID: CASSIDY TASHIRO, female   DOB: [birth year], 72 y.o.   MRN: [id number]  HPI Mrs. Reek is a 72 year old female who presents for follow-up for L knee pain.  She was seen in clinic 04/30/18 for the same and L knee cortisone injection was performed.  She reports pain is drastically improved and currently rates it as 0/10.  When she does experience pain, it is a "twinging" sensation that is worsened by walking down stairs and improved with rest.  She denies radiation anywhere and is not treating it with any medication.  She has completed all of her physical therapy and found it to be helpful in pain relief.  Denies numbness/tingling, weakness or difficulty with ambulation.  Mrs. Legere is active and walks ~10,000 steps/day.  Review of Systems  Neurological: Negative for tingling and weakness.      Objective:   Physical Exam  L knee Inspection: No erythema or swelling Palpation: No TTP Range of Motion: Full ROM, some crepitus noted. Strength: 5/5 to knee flexion, extension, hip flexion, extension, abduction and adduction, dorsiflexion and plantar flexion bilaterally. Stability: Negative [place]  Special Tests: Negative Thessaly test Neurovascular Status: intact    Assessment:     Mrs. Lajeunesse is a 72 year old female who presents for follow-up for L knee pain s/p cortisone injection with improvement of symptoms.  Physical exam today reassuring.    Plan:     Discussed with patient need to continue strengthening exercises and perform physical activity as tolerated.  Patient encouraged to follow-up with office if experiencing worsening of pain.  Patient seen and evaluated with the medical student.  I agree with the above plan of care.  Patient is doing much better after recent cortisone injection.  I have encouraged her to continue with the exercises she learned in physical therapy.  Follow-up as needed.

## 2018-05-21 ENCOUNTER — Ambulatory Visit (INDEPENDENT_AMBULATORY_CARE_PROVIDER_SITE_OTHER): Payer: Medicare Other | Admitting: Sports Medicine

## 2018-05-21 ENCOUNTER — Encounter: Payer: Self-pay | Admitting: Sports Medicine

## 2018-05-21 ENCOUNTER — Ambulatory Visit: Payer: Self-pay

## 2018-05-21 VITALS — BP 114/65 | Ht 64.5 in | Wt 130.0 lb

## 2018-05-21 DIAGNOSIS — S83281A Other tear of lateral meniscus, current injury, right knee, initial encounter: Secondary | ICD-10-CM | POA: Diagnosis not present

## 2018-05-21 DIAGNOSIS — M25562 Pain in left knee: Secondary | ICD-10-CM

## 2018-05-21 DIAGNOSIS — S83282A Other tear of lateral meniscus, current injury, left knee, initial encounter: Secondary | ICD-10-CM | POA: Insufficient documentation

## 2018-05-21 NOTE — Assessment & Plan Note (Signed)
-   Ultrasound performed today concerning for lateral meniscus tear with parameniscal cyst and large joint effusion - referral to [facility] for further treatment - will obtain x-rays there tomorrow morning at 830am - may need aspiration with repeat corticosteroid injection - previous MRI negative for meniscus pathology and demonstrates mild osteoarthritis

## 2018-05-21 NOTE — Patient Instructions (Signed)
[place] [phone]  Appt: 05/22/18 @ 8:30 am

## 2018-05-21 NOTE — Progress Notes (Addendum)
Destiny Andersen - 72 y.o. female MRN [id number]  Date of birth: 08-23-1945   Chief complaint: Left knee injury  SUBJECTIVE:    History of present illness: 72 year old female who presents today with a chief complaint left knee injury.  She states that her injury occurred yesterday when she was stepping off of a ladder.  She missed 2 steps and had an extension knee injury.  She felt a sudden pain in the lateral aspect of her knee almost like her knee gave out on her.  Since the injury, she has been limping and feeling like her knee is unstable.  She has had several problems with this knee in the past earlier this year and underwent 21 sessions of physical therapy as well as a corticosteroid injection 3 weeks ago.  Her knee pain had resolved with this.  She had an MRI of her knee on 02/04/2018 which demonstrated no acute internal derangement.  No meniscal pathology.  She did have mild medial compartment degenerative arthritis but it was otherwise negative.  Now currently she is having mechanical symptoms of instability of her knee and popping.  She is also having swelling.  She has worn a knee sleeve which helps with her symptoms.  She is intolerant of anti-inflammatory secondary to chronic kidney disease.  She is here today frustrated with her recent knee injury.   Review of systems:  As stated above   Interval past medical history, surgical history, family history, and social history obtained and are unchanged.   Medications reviewed and unchanged. Allergies reviewed and unchanged.  ULTRASOUND: Knee, Right Diagnostic limited ultrasound imaging obtained of patient's R knee.  - Quadriceps tendon: No appreciated signs of tearing, edema, or calcification. Large fluid collection noted within the suprapatellar pouch.  - Patellar tendon: not visualized - Medial joint line: No signs concerning for meniscal pathology appreciated. Small pocket of hypoechoic fluid noted. Mild joint space loss appreciated.    - Lateral joint line: Large parameniscal cyst with imaging concerning for meniscus tear. Joint effusion present.  OBJECTIVE:  Physical exam: Vital signs are reviewed. BP 114/65   Ht 5' 4.5" (1.638 m)   Wt 130 lb (59 kg)   BMI 21.97 kg/m   Gen.: Alert, oriented, appears stated age, in no apparent distress Integumentary: No rashes or ecchymosis Neurologic: sensation is intact for L4-S1 Gait: antalgic favoring right side  Musculoskeletal: Inspection of the right knee demonstrates moderate swelling. TTP on the lateral and medial joint line. Decreased ROM in flexion secondary to swelling. Strength limited 2/2 to swelling. Positive McMurray with pain and popping on lateral joint line. Neg Lachman. Neg Ant/Post drawer. Neurovascularly intact. DP 2+.    ASSESSMENT & PLAN: Acute lateral meniscus tear of right knee - Ultrasound performed today concerning for lateral meniscus tear with parameniscal cyst and large joint effusion - referral to [facility] for further treatment - will obtain x-rays there tomorrow morning at 830am - may need aspiration with repeat corticosteroid injection - previous MRI negative for meniscus pathology and demonstrates mild osteoarthritis   Orders Placed This Encounter  Procedures  . [place] LIMITED JOINT SPACE STRUCTURES LOW LEFT    Standing Status:   Future    Number of Occurrences:   1    Standing Expiration Date:   07/23/2019    Order Specific Question:   Reason for Exam (SYMPTOM  OR DIAGNOSIS REQUIRED)    Answer:   left knee pain    Order Specific Question:   Preferred imaging location?  Answer:   Internal     Clydene Laming, DO Sports Medicine Fellow [place]  I was the preceptor for this visit and available for immediate consultation. Shellia Cleverly, DO

## 2018-05-29 HISTORY — PX: OTHER SURGICAL HISTORY: SHX169

## 2018-06-06 ENCOUNTER — Encounter: Payer: Self-pay | Admitting: Physical Therapy

## 2018-06-06 ENCOUNTER — Ambulatory Visit: Payer: Medicare Other | Attending: Sports Medicine | Admitting: Physical Therapy

## 2018-06-06 DIAGNOSIS — M6281 Muscle weakness (generalized): Secondary | ICD-10-CM

## 2018-06-06 DIAGNOSIS — M25562 Pain in left knee: Secondary | ICD-10-CM | POA: Diagnosis not present

## 2018-06-06 DIAGNOSIS — M25662 Stiffness of left knee, not elsewhere classified: Secondary | ICD-10-CM

## 2018-06-06 DIAGNOSIS — R262 Difficulty in walking, not elsewhere classified: Secondary | ICD-10-CM

## 2018-06-06 NOTE — Therapy (Signed)
[place], [place], [address] Phone: [phone]   Fax:  [phone]  Physical Therapy Evaluation  Patient Details  Name: Destiny Andersen MRN: [id number] Date of Birth: 02-15-1946 Referring Provider (PT): Elsie Saas, MD   Encounter Date: 06/06/2018  PT End of Session - 06/06/18 1325    Visit Number  1    Number of Visits  8    Date for PT Re-Evaluation  07/18/18    Authorization Type  U[employer] MCR    PT Start Time  1101    PT Stop Time  1145    PT Time Calculation (min)  44 min    Activity Tolerance  Patient tolerated treatment well    Behavior During Therapy  [facility] for tasks assessed/performed       Past Medical History:  Diagnosis Date  . Cancer [facility]) 1983   Cervical cancer.  Skin Cancer- leg  . Chronic headaches    "? Migraines"- does not have then any more  . Constipation   . Elevated C-reactive protein (CRP)   . Family history of breast cancer    Mother  . Fatigue   . Heart murmur    ECHO 02/10/13 EF = 60-65%, moderate posterior mitral valve prolapse w/possible flail segment, severe MR, mitral regurgitant jet is anteriorly directed.  Marland Kitchen History of blood transfusion    pre-eclampsia   . Hyperpotassemia    Migrated  . Medicare annual wellness visit, subsequent   . Mitral valve prolapse   . MR (mitral regurgitation)    ECHO 02/10/13 EF = 60-65%, moderate posterior mitral valve prolapse w/possible flail segment, severe MR, mitral regurgitant jet is anteriorly directed.  . Risk for falls   . Routine general medical examination at a health care facility   . S/P minimally invasive mitral valve repair 02/01/2016   Complex valvuloplasty including triangular resection of flail posterior leaflet, artificial Gore-tex neochord placement x10 and 28 mm Sorin Memo 3D ring annuloplasty via right mini thoracotomy approach  . Seizures ([place])    with pre- echa  . Shortness of breath dyspnea    with exertion     Past Surgical History:  Procedure Laterality Date  . BREAST BIOPSY Right 1998   benign cyst  . CARDIAC CATHETERIZATION N/A 12/21/2015   Procedure: Right/Left Heart Cath and Coronary Angiography;  Surgeon: Sherren Mocha, MD;  Location: [place] CV LAB;  Service: Cardiovascular;  Laterality: N/A;  . COLONOSCOPY  08/2001   Colon cancer screening in 01/2012  . LAPAROSCOPIC CHOLECYSTECTOMY  1990  . left knee arthroscopic surgery Left 05/29/2018  . MITRAL VALVE REPAIR Right 02/01/2016   Procedure: MINIMALLY INVASIVE MITRAL VALVE REPAIR (MVR) with closure of PFO;  Surgeon: Rexene Alberts, MD;  Location: [place];  Service: Open Heart Surgery;  Laterality: Right;  . TEE WITHOUT CARDIOVERSION N/A 10/14/2015   Procedure: TRANSESOPHAGEAL ECHOCARDIOGRAM (TEE);  Surgeon: Thayer Headings, MD;  Location: [place];  Service: Cardiovascular;  Laterality: N/A;  . TEE WITHOUT CARDIOVERSION N/A 02/01/2016   Procedure: TRANSESOPHAGEAL ECHOCARDIOGRAM (TEE);  Surgeon: Rexene Alberts, MD;  Location: [place];  Service: Open Heart Surgery;  Laterality: N/A;  . TOTAL ABDOMINAL HYSTERECTOMY  1983   Cervical cancer 1983    There were no vitals filed for this visit.   Subjective Assessment - 06/06/18 1309    Subjective  Pt. is a 72 y/o female s/p left knee arthroscopic surgery 05/29/18. Pt. originally had onset of left knee pain  around Thanksgiving of 2018 after sitting for prolonged period with legs crossed. MRI last Summer was (-) meniscus tear/only showed degenerative changes. Pt. underwent extensive therapy earlier this Fall (x20 visits with d/c 05/06/18) and was improved at the time with this and injection. On 05/20/18 pt. was climbing down small ladder from retrieving Christmas decorations and slipped/missed the last 2 steps and hyperextended knee resulting in meniscus tear and subsequent surgery to address. Pt. briefly used crutches after surgery but is currently ambulating independently without AD.     Pertinent History  knee OA, heart mitral valve repair 2017, past therapy for recent episode of care for same knee    Limitations  Standing;Walking;House hold activities    How long can you sit comfortably?  no limitations    Diagnostic tests  [place] s/p injury, MRI last summer    Patient Stated Goals  decrease pain/improve ROM, walk without pain    Currently in Pain?  Yes    Pain Score  2     Pain Location  Knee    Pain Orientation  Left    Pain Descriptors / Indicators  Aching    Pain Type  Surgical pain    Pain Onset  1 to 4 weeks ago    Pain Frequency  Intermittent    Aggravating Factors   standing, walking, activity    Pain Relieving Factors  rest    Effect of Pain on Daily Activities  Limits tolerance walking, performing chores, navigating stairs         [facility] PT Assessment - 06/06/18 0001      Assessment   Medical Diagnosis  s/p left knee arthroscopic surgery for partial lateral meniscectomy    Referring Provider (PT)  Elsie Saas, MD    Onset Date/Surgical Date  05/29/18   surgery 05/29/18, injury 05/20/18   Hand Dominance  Right    Prior Therapy  Past PT for left knee with d/c 05/06/18      Precautions   Precautions  None      Restrictions   Weight Bearing Restrictions  No      Balance Screen   Has the patient fallen in the past 6 months  --   slip off ladder 05/20/18 but no fall to ground,no other falls     [place] residence      Prior Function   Level of Independence  Independent with basic ADLs      Cognition   Overall Cognitive Status  Within Functional Limits for tasks assessed      Observation/Other Assessments   Observations  --   surgical incisions healing well, no signs infection   Focus on Therapeutic Outcomes (FOTO)   49% limited      Observation/Other Assessments-Edema    Edema  --   Lt. knee 33 cm, Rt. knee 32 cm at mid-patella     ROM / Strength   AROM / PROM / Strength  AROM;Strength      AROM    Right Knee Extension  0    Right Knee Flexion  145    Left Knee Extension  2    Left Knee Flexion  126      Strength   Overall Strength Comments  Left knee MMTs not tested given recent post-op status, right knee 5/5, right hip flex and ER/IR 5/5                Objective measurements completed on examination:  See above findings.      [place] Adult PT Treatment/Exercise - 06/06/18 0001      Exercises   Exercises  Knee/Hip      Knee/Hip Exercises: Supine   Quad Sets  Left;10 reps    Short Arc Quad Sets  Left;10 reps    Heel Slides  Left;10 reps    Straight Leg Raises  Left;10 reps      Knee/Hip Exercises: Sidelying   Hip ABduction  AROM;Left;10 reps    Hip ADduction  AROM;Left;10 reps      Knee/Hip Exercises: Prone   Hamstring Curl  10 reps    Hip Extension  Left;10 reps             PT Education - 06/06/18 1324    Education Details  HEP, knee/meniscus anatomy, expected post-op healing timeframe, avoid twisting motions and deep squats    Person(s) Educated  Patient    Methods  Explanation;Demonstration;Tactile cues;Verbal cues;Handout   Reviewed HEP handout issued at MD office yesterday   Comprehension  Verbalized understanding;Returned demonstration;Verbal cues required;Tactile cues required          PT Long Term Goals - 06/06/18 1331      PT LONG TERM GOAL #1   Title  Independent with HEP    Baseline  reviewed HEP from MD office yesterday    Time  6    Period  Weeks    Status  New    Target Date  07/18/18      PT LONG TERM GOAL #2   Title  Pt will increase FOTO to less than 34% or less limited.     Baseline  49% limited    Time  6    Period  Weeks    Status  New      PT LONG TERM GOAL #3   Title  Left knee strength 5/5 to improve ability for stair navigation and bending/squatting motions for chores    Baseline  MMTs not tested for left side at eval given recent post-op status    Time  6    Period  Weeks    Status  New    Target Date   07/18/18      PT LONG TERM GOAL #4   Title  Descend stairs consistently with reciprocal gait for home and community mobility    Baseline  unable    Time  6    Period  Weeks    Status  New    Target Date  07/18/18             Plan - 06/06/18 1326    Clinical Impression Statement  Pt. presents 8 days s/p left knee arthroscopic surgery. Pt. doing well for timeframe post-op but presents with left knee/hip weakness and mild stiffness. Pt. would benefit from PT to address current associated functional limitations for mobility.    History and Personal Factors relevant to plan of care:  past PT for same knee prior to current episode/injury and subsequent surgery    Clinical Presentation  Stable    Clinical Decision Making  Low    Rehab Potential  Good    PT Frequency  --   8 visits   PT Duration  6 weeks   8 visits but plan of care dates to extend 6 weeks to account for limited scheduling over holiday   PT Treatment/Interventions  ADLs/Self Care Home Management;Cryotherapy;Electrical Stimulation;Moist Heat;Ultrasound;Therapeutic exercise;Therapeutic activities;Neuromuscular re-education;Manual techniques;Passive range of motion;Dry needling;Taping;Functional mobility training;Stair  training;Gait training;Patient/family education;Vasopneumatic Device    PT Next Visit Plan  Rec bike warm up, knee ROM for extension stretches and knee flexion, standing TKE, continue SLRs/open chain exercise focus, cryo/estim prn    PT Home Exercise Plan  (handout issued at MD office 05/28/18)-quad sets, mat-based 4 way SLRs, SAQ, prone knee flexion, heel slides    Consulted and Agree with Plan of Care  Patient       Patient will benefit from skilled therapeutic intervention in order to improve the following deficits and impairments:  Abnormal gait, Pain, Increased edema, Difficulty walking, Decreased activity tolerance, Decreased range of motion, Decreased strength  Visit Diagnosis: Left knee pain,  unspecified chronicity  Stiffness of left knee, not elsewhere classified  Muscle weakness (generalized)  Difficulty in walking, not elsewhere classified     Problem List Patient Active Problem List   Diagnosis Date Noted  . Acute lateral meniscus tear of right knee 05/21/2018  . Hypotension 02/15/2017  . Long term (current) use of anticoagulants [Z79.01] 02/09/2016  . History of mitral valve repair 02/01/2016    Beaulah Dinning, PT, DPT 06/06/18 1:37 PM  [facility] [address] [place], [place], [address] Phone: [phone]   Fax:  [phone]  Name: Destiny Andersen MRN: [id number] Date of Birth: 1945-11-27

## 2018-06-13 ENCOUNTER — Encounter

## 2018-06-20 ENCOUNTER — Encounter

## 2018-06-24 ENCOUNTER — Ambulatory Visit: Payer: Medicare Other | Attending: Sports Medicine | Admitting: Physical Therapy

## 2018-06-24 DIAGNOSIS — M25562 Pain in left knee: Secondary | ICD-10-CM | POA: Diagnosis present

## 2018-06-24 DIAGNOSIS — M25662 Stiffness of left knee, not elsewhere classified: Secondary | ICD-10-CM | POA: Insufficient documentation

## 2018-06-24 DIAGNOSIS — R262 Difficulty in walking, not elsewhere classified: Secondary | ICD-10-CM | POA: Insufficient documentation

## 2018-06-24 DIAGNOSIS — M6281 Muscle weakness (generalized): Secondary | ICD-10-CM | POA: Diagnosis present

## 2018-06-24 DIAGNOSIS — G8929 Other chronic pain: Secondary | ICD-10-CM | POA: Diagnosis present

## 2018-06-24 NOTE — Therapy (Signed)
[place], [place], [address] Phone: [phone]   Fax:  [phone]  Physical Therapy Treatment  Patient Details  Name: Destiny Andersen MRN: [id number] Date of Birth: 21-Dec-1945 Referring Provider (PT): Elsie Saas, MD   Encounter Date: 06/24/2018  PT End of Session - 06/24/18 1338    Visit Number  2    Number of Visits  8    Date for PT Re-Evaluation  07/18/18    Authorization Type  UHC MCR    PT Start Time  1332    PT Stop Time  1420    PT Time Calculation (min)  48 min    Activity Tolerance  Patient tolerated treatment well    Behavior During Therapy  [facility] for tasks assessed/performed       Past Medical History:  Diagnosis Date  . Cancer [facility]) 1983   Cervical cancer.  Skin Cancer- leg  . Chronic headaches    "? Migraines"- does not have then any more  . Constipation   . Elevated C-reactive protein (CRP)   . Family history of breast cancer    Mother  . Fatigue   . Heart murmur    ECHO 02/10/13 EF = 60-65%, moderate posterior mitral valve prolapse w/possible flail segment, severe MR, mitral regurgitant jet is anteriorly directed.  Marland Kitchen History of blood transfusion    pre-eclampsia   . Hyperpotassemia    Migrated  . Medicare annual wellness visit, subsequent   . Mitral valve prolapse   . MR (mitral regurgitation)    ECHO 02/10/13 EF = 60-65%, moderate posterior mitral valve prolapse w/possible flail segment, severe MR, mitral regurgitant jet is anteriorly directed.  . Risk for falls   . Routine general medical examination at a health care facility   . S/P minimally invasive mitral valve repair 02/01/2016   Complex valvuloplasty including triangular resection of flail posterior leaflet, artificial Gore-tex neochord placement x10 and 28 mm Sorin Memo 3D ring annuloplasty via right mini thoracotomy approach  . Seizures ([place])    with pre- echa  . Shortness of breath dyspnea    with exertion     Past Surgical History:  Procedure Laterality Date  . BREAST BIOPSY Right 1998   benign cyst  . CARDIAC CATHETERIZATION N/A 12/21/2015   Procedure: Right/Left Heart Cath and Coronary Angiography;  Surgeon: Sherren Mocha, MD;  Location: [place] CV LAB;  Service: Cardiovascular;  Laterality: N/A;  . COLONOSCOPY  08/2001   Colon cancer screening in 01/2012  . LAPAROSCOPIC CHOLECYSTECTOMY  1990  . left knee arthroscopic surgery Left 05/29/2018  . MITRAL VALVE REPAIR Right 02/01/2016   Procedure: MINIMALLY INVASIVE MITRAL VALVE REPAIR (MVR) with closure of PFO;  Surgeon: Rexene Alberts, MD;  Location: [place];  Service: Open Heart Surgery;  Laterality: Right;  . TEE WITHOUT CARDIOVERSION N/A 10/14/2015   Procedure: TRANSESOPHAGEAL ECHOCARDIOGRAM (TEE);  Surgeon: Thayer Headings, MD;  Location: [place];  Service: Cardiovascular;  Laterality: N/A;  . TEE WITHOUT CARDIOVERSION N/A 02/01/2016   Procedure: TRANSESOPHAGEAL ECHOCARDIOGRAM (TEE);  Surgeon: Rexene Alberts, MD;  Location: [place];  Service: Open Heart Surgery;  Laterality: N/A;  . TOTAL ABDOMINAL HYSTERECTOMY  1983   Cervical cancer 1983    There were no vitals filed for this visit.  Subjective Assessment - 06/24/18 1338    Subjective  No pain today, some stiffness. The recumbant bike a thte gym made my knee swell a bit.  [place] Adult PT Treatment/Exercise - 06/24/18 0001      Knee/Hip Exercises: Stretches   Active Hamstring Stretch  Left;3 reps    Knee: Self-Stretch to increase Flexion  5 reps    Knee: Self-Stretch Limitations  30 sec       Knee/Hip Exercises: Aerobic   Nustep  L 5 LE only       Knee/Hip Exercises: Standing   Terminal Knee Extension  Strengthening;Left;1 set;20 reps    Wall Squat  1 set;15 reps    SLS with Vectors  semi circles x 15       Knee/Hip Exercises: Supine   Straight Leg Raises  Left;1 set;20 reps    Straight Leg Raise with External Rotation  Strengthening;Left;1 set;20 reps       Knee/Hip Exercises: Sidelying   Hip ABduction  Strengthening;Left;1 set;20 reps    Hip ADduction  Strengthening;Left;1 set;20 reps      Knee/Hip Exercises: Prone   Hamstring Curl  1 set;15 reps    Hamstring Curl Limitations  3 lbs     Hip Extension  Strengthening;Left;2 sets;10 reps      Cryotherapy   Number Minutes Cryotherapy  8 Minutes    Cryotherapy Location  Knee    Type of Cryotherapy  Ice pack             PT Education - 06/24/18 1338    Education Details  HEP     Person(s) Educated  Patient    Methods  Explanation    Comprehension  Verbalized understanding          PT Long Term Goals - 06/06/18 1331      PT LONG TERM GOAL #1   Title  Independent with HEP    Baseline  reviewed HEP from MD office yesterday    Time  6    Period  Weeks    Status  New    Target Date  07/18/18      PT LONG TERM GOAL #2   Title  Pt will increase FOTO to less than 34% or less limited.     Baseline  49% limited    Time  6    Period  Weeks    Status  New      PT LONG TERM GOAL #3   Title  Left knee strength 5/5 to improve ability for stair navigation and bending/squatting motions for chores    Baseline  MMTs not tested for left side at eval given recent post-op status    Time  6    Period  Weeks    Status  New    Target Date  07/18/18      PT LONG TERM GOAL #4   Title  Descend stairs consistently with reciprocal gait for home and community mobility    Baseline  unable    Time  6    Period  Weeks    Status  New    Target Date  07/18/18            Plan - 06/24/18 1338    Clinical Impression Statement  Pt doing well, knows HEP, gave minor corrections for her routine.  Able to do wall squats without increasing knee pain.     PT Treatment/Interventions  ADLs/Self Care Home Management;Cryotherapy;Electrical Stimulation;Moist Heat;Ultrasound;Therapeutic exercise;Therapeutic activities;Neuromuscular re-education;Manual techniques;Passive range of motion;Dry  needling;Taping;Functional mobility training;Stair training;Gait training;Patient/family education;Vasopneumatic Device    PT Next Visit Plan  Rec bike warm up, knee ROM for extension stretches  and knee flexion, standing TKE, continue SLRs/open chain exercise focus, cryo/estim prn    PT Home Exercise Plan  (handout issued at MD office 05/28/18)-quad sets, mat-based 4 way SLRs, SAQ, prone knee flexion, heel slides    Consulted and Agree with Plan of Care  Patient       Patient will benefit from skilled therapeutic intervention in order to improve the following deficits and impairments:  Abnormal gait, Pain, Increased edema, Difficulty walking, Decreased activity tolerance, Decreased range of motion, Decreased strength  Visit Diagnosis: Left knee pain, unspecified chronicity  Stiffness of left knee, not elsewhere classified  Muscle weakness (generalized)  Difficulty in walking, not elsewhere classified     Problem List Patient Active Problem List   Diagnosis Date Noted  . Acute lateral meniscus tear of right knee 05/21/2018  . Hypotension 02/15/2017  . Long term (current) use of anticoagulants [Z79.01] 02/09/2016  . History of mitral valve repair 02/01/2016    Destiny Andersen 06/24/2018, 3:06 PM  [place], [place], [address] Phone: [phone]   Fax:  [phone]  Name: Destiny Andersen MRN: [id number] Date of Birth: 08-27-1945  Raeford Razor, PT 06/24/18 3:06 PM Phone: [phone] Fax: [phone]

## 2018-06-27 ENCOUNTER — Other Ambulatory Visit: Payer: Self-pay | Admitting: Family Medicine

## 2018-06-27 ENCOUNTER — Ambulatory Visit: Payer: Medicare Other | Admitting: Physical Therapy

## 2018-06-27 DIAGNOSIS — M6281 Muscle weakness (generalized): Secondary | ICD-10-CM

## 2018-06-27 DIAGNOSIS — M25562 Pain in left knee: Secondary | ICD-10-CM | POA: Diagnosis not present

## 2018-06-27 DIAGNOSIS — Z1231 Encounter for screening mammogram for malignant neoplasm of breast: Secondary | ICD-10-CM

## 2018-06-27 DIAGNOSIS — M25662 Stiffness of left knee, not elsewhere classified: Secondary | ICD-10-CM

## 2018-06-27 DIAGNOSIS — R262 Difficulty in walking, not elsewhere classified: Secondary | ICD-10-CM

## 2018-06-27 DIAGNOSIS — G8929 Other chronic pain: Secondary | ICD-10-CM

## 2018-06-27 NOTE — Patient Instructions (Signed)
Step 1  Step 2  Single Leg Stance reps: 5  sets: 1  hold: 30  daily: 1  weekly: 7 Setup  Begin in a standing upright position with your feet together and arms resting at your sides. Movement  Lift one foot off the floor, balancing on your other leg. Maintain your balance in this position. Tip  Try not to move your arms away from your body or let your weight shift from side to side. Step 1  Step 2  Step 3  Sit to Stand with Arms Crossed reps: 10  sets: 2  hold: 5  daily: 1  weekly: 7 Setup  Begin sitting upright in a chair. Movement  Cross your arms on your chest and lean your torso forward, then press into your feet to stand up. Slowly sit back down and repeat. Tip  Make sure to maintain your balance and try to keep your weight evenly distributed between both legs. Do not lock your knees when you are standing.

## 2018-06-27 NOTE — Therapy (Signed)
[place], [place], [address] Phone: [phone]   Fax:  [phone]  Physical Therapy Treatment  Patient Details  Name: Destiny Andersen MRN: [id number] Date of Birth: January 01, 1946 Referring Provider (PT): Elsie Saas, MD   Encounter Date: 06/27/2018  PT End of Session - 06/27/18 1047    Visit Number  3    Number of Visits  8    Date for PT Re-Evaluation  07/18/18    Authorization Type  UHC MCR    PT Start Time  1010    PT Stop Time  1103    PT Time Calculation (min)  53 min    Activity Tolerance  Patient tolerated treatment well    Behavior During Therapy  [facility] for tasks assessed/performed       Past Medical History:  Diagnosis Date  . Cancer [facility]) 1983   Cervical cancer.  Skin Cancer- leg  . Chronic headaches    "? Migraines"- does not have then any more  . Constipation   . Elevated C-reactive protein (CRP)   . Family history of breast cancer    Mother  . Fatigue   . Heart murmur    ECHO 02/10/13 EF = 60-65%, moderate posterior mitral valve prolapse w/possible flail segment, severe MR, mitral regurgitant jet is anteriorly directed.  Marland Kitchen History of blood transfusion    pre-eclampsia   . Hyperpotassemia    Migrated  . Medicare annual wellness visit, subsequent   . Mitral valve prolapse   . MR (mitral regurgitation)    ECHO 02/10/13 EF = 60-65%, moderate posterior mitral valve prolapse w/possible flail segment, severe MR, mitral regurgitant jet is anteriorly directed.  . Risk for falls   . Routine general medical examination at a health care facility   . S/P minimally invasive mitral valve repair 02/01/2016   Complex valvuloplasty including triangular resection of flail posterior leaflet, artificial Gore-tex neochord placement x10 and 28 mm Sorin Memo 3D ring annuloplasty via right mini thoracotomy approach  . Seizures ([place])    with pre- echa  . Shortness of breath dyspnea    with exertion     Past Surgical History:  Procedure Laterality Date  . BREAST BIOPSY Right 1998   benign cyst  . CARDIAC CATHETERIZATION N/A 12/21/2015   Procedure: Right/Left Heart Cath and Coronary Angiography;  Surgeon: Sherren Mocha, MD;  Location: [place] CV LAB;  Service: Cardiovascular;  Laterality: N/A;  . COLONOSCOPY  08/2001   Colon cancer screening in 01/2012  . LAPAROSCOPIC CHOLECYSTECTOMY  1990  . left knee arthroscopic surgery Left 05/29/2018  . MITRAL VALVE REPAIR Right 02/01/2016   Procedure: MINIMALLY INVASIVE MITRAL VALVE REPAIR (MVR) with closure of PFO;  Surgeon: Rexene Alberts, MD;  Location: [place];  Service: Open Heart Surgery;  Laterality: Right;  . TEE WITHOUT CARDIOVERSION N/A 10/14/2015   Procedure: TRANSESOPHAGEAL ECHOCARDIOGRAM (TEE);  Surgeon: Thayer Headings, MD;  Location: [place];  Service: Cardiovascular;  Laterality: N/A;  . TEE WITHOUT CARDIOVERSION N/A 02/01/2016   Procedure: TRANSESOPHAGEAL ECHOCARDIOGRAM (TEE);  Surgeon: Rexene Alberts, MD;  Location: [place];  Service: Open Heart Surgery;  Laterality: N/A;  . TOTAL ABDOMINAL HYSTERECTOMY  1983   Cervical cancer 1983    There were no vitals filed for this visit.  Subjective Assessment - 06/27/18 1019    Subjective  Saw PA.  Told me to use Mederma on my scar.  No pain.  Already stretched today.  Currently in Pain?  No/denies          [facility] Adult PT Treatment/Exercise - 06/27/18 0001      Knee/Hip Exercises: Stretches   Active Hamstring Stretch  Left;2 reps    ITB Stretch  Left;2 reps      Knee/Hip Exercises: Aerobic   Stationary Bike  5 min L3       Knee/Hip Exercises: Standing   Hip Flexion  Stengthening;Both;1 set;15 reps    Hip Flexion Limitations  high march blue x 15      Hip Abduction  Stengthening;Both;1 set;15 reps    Abduction Limitations  on foam     Functional Squat  1 set;10 reps    Functional Squat Limitations  TRX     Walking with Sports Cord  lateral band walks blue band x  4 x 15 feet     Other Standing Knee Exercises  TRX balance: tandem, eyes closed, head turns m change leg     Other Standing Knee Exercises  curtsy lunge small ROM x 10, single leg lunge small ROM x 10       Cryotherapy   Number Minutes Cryotherapy  10 Minutes    Cryotherapy Location  Knee    Type of Cryotherapy  Ice pack             PT Education - 06/27/18 1046    Education Details  SLS, closed chain activities     Person(s) Educated  Patient    Methods  Explanation;Handout    Comprehension  Verbalized understanding          PT Long Term Goals - 06/06/18 1331      PT LONG TERM GOAL #1   Title  Independent with HEP    Baseline  reviewed HEP from MD office yesterday    Time  6    Period  Weeks    Status  New    Target Date  07/18/18      PT LONG TERM GOAL #2   Title  Pt will increase FOTO to less than 34% or less limited.     Baseline  49% limited    Time  6    Period  Weeks    Status  New      PT LONG TERM GOAL #3   Title  Left knee strength 5/5 to improve ability for stair navigation and bending/squatting motions for chores    Baseline  MMTs not tested for left side at eval given recent post-op status    Time  6    Period  Weeks    Status  New    Target Date  07/18/18      PT LONG TERM GOAL #4   Title  Descend stairs consistently with reciprocal gait for home and community mobility    Baseline  unable    Time  6    Period  Weeks    Status  New    Target Date  07/18/18            Plan - 06/27/18 1047    Clinical Impression Statement  Patient challenged with standing exercises and balance work.  Min increase in pain with lateral band walks and squats.     PT Treatment/Interventions  ADLs/Self Care Home Management;Cryotherapy;Electrical Stimulation;Moist Heat;Ultrasound;Therapeutic exercise;Therapeutic activities;Neuromuscular re-education;Manual techniques;Passive range of motion;Dry needling;Taping;Functional mobility training;Stair training;Gait  training;Patient/family education;Vasopneumatic Device    PT Next Visit Plan  Rec bike warm up, knee ROM for extension stretches  and knee flexion, standing TKE, continue SLRs/open chain exercise focus, cryo/estim prn    PT Home Exercise Plan  level 1 knee, added SLS and sit to stand today.     Consulted and Agree with Plan of Care  Patient       Patient will benefit from skilled therapeutic intervention in order to improve the following deficits and impairments:  Abnormal gait, Pain, Increased edema, Difficulty walking, Decreased activity tolerance, Decreased range of motion, Decreased strength  Visit Diagnosis: Left knee pain, unspecified chronicity  Stiffness of left knee, not elsewhere classified  Muscle weakness (generalized)  Difficulty in walking, not elsewhere classified  Chronic pain of left knee     Problem List Patient Active Problem List   Diagnosis Date Noted  . Acute lateral meniscus tear of right knee 05/21/2018  . Hypotension 02/15/2017  . Long term (current) use of anticoagulants [Z79.01] 02/09/2016  . History of mitral valve repair 02/01/2016    Naasir Carreira 06/27/2018, 11:00 [place], [place], [address] Phone: [phone]   Fax:  [phone]  Name: DELSIE AMADOR MRN: [id number] Date of Birth: [birth year]  Raeford Razor, PT 06/27/18 11:00 AM Phone: [phone] Fax: [phone]

## 2018-06-30 ENCOUNTER — Ambulatory Visit: Payer: Medicare Other | Admitting: Physical Therapy

## 2018-06-30 DIAGNOSIS — G8929 Other chronic pain: Secondary | ICD-10-CM

## 2018-06-30 DIAGNOSIS — M25562 Pain in left knee: Secondary | ICD-10-CM | POA: Diagnosis not present

## 2018-06-30 DIAGNOSIS — R262 Difficulty in walking, not elsewhere classified: Secondary | ICD-10-CM

## 2018-06-30 DIAGNOSIS — M25662 Stiffness of left knee, not elsewhere classified: Secondary | ICD-10-CM

## 2018-06-30 DIAGNOSIS — M6281 Muscle weakness (generalized): Secondary | ICD-10-CM

## 2018-06-30 NOTE — Therapy (Signed)
[place], [place], [address] Phone: [phone]   Fax:  [phone]  Physical Therapy Treatment  Patient Details  Name: Destiny Andersen MRN: [id number] Date of Birth: 1945/08/27 Referring Provider (PT): Elsie Saas, MD   Encounter Date: 06/30/2018  PT End of Session - 06/30/18 1157    Visit Number  4    Number of Visits  8    Date for PT Re-Evaluation  07/18/18    Authorization Type  UHC MCR    PT Start Time  8811    PT Stop Time  1223    PT Time Calculation (min)  38 min    Activity Tolerance  Patient tolerated treatment well    Behavior During Therapy  [facility] for tasks assessed/performed       Past Medical History:  Diagnosis Date  . Cancer [facility]) 1983   Cervical cancer.  Skin Cancer- leg  . Chronic headaches    "? Migraines"- does not have then any more  . Constipation   . Elevated C-reactive protein (CRP)   . Family history of breast cancer    Mother  . Fatigue   . Heart murmur    ECHO 02/10/13 EF = 60-65%, moderate posterior mitral valve prolapse w/possible flail segment, severe MR, mitral regurgitant jet is anteriorly directed.  Marland Kitchen History of blood transfusion    pre-eclampsia   . Hyperpotassemia    Migrated  . Medicare annual wellness visit, subsequent   . Mitral valve prolapse   . MR (mitral regurgitation)    ECHO 02/10/13 EF = 60-65%, moderate posterior mitral valve prolapse w/possible flail segment, severe MR, mitral regurgitant jet is anteriorly directed.  . Risk for falls   . Routine general medical examination at a health care facility   . S/P minimally invasive mitral valve repair 02/01/2016   Complex valvuloplasty including triangular resection of flail posterior leaflet, artificial Gore-tex neochord placement x10 and 28 mm Sorin Memo 3D ring annuloplasty via right mini thoracotomy approach  . Seizures ([place])    with pre- echa  . Shortness of breath dyspnea    with exertion     Past Surgical History:  Procedure Laterality Date  . BREAST BIOPSY Right 1998   benign cyst  . CARDIAC CATHETERIZATION N/A 12/21/2015   Procedure: Right/Left Heart Cath and Coronary Angiography;  Surgeon: Sherren Mocha, MD;  Location: [place] CV LAB;  Service: Cardiovascular;  Laterality: N/A;  . COLONOSCOPY  08/2001   Colon cancer screening in 01/2012  . LAPAROSCOPIC CHOLECYSTECTOMY  1990  . left knee arthroscopic surgery Left 05/29/2018  . MITRAL VALVE REPAIR Right 02/01/2016   Procedure: MINIMALLY INVASIVE MITRAL VALVE REPAIR (MVR) with closure of PFO;  Surgeon: Rexene Alberts, MD;  Location: [place];  Service: Open Heart Surgery;  Laterality: Right;  . TEE WITHOUT CARDIOVERSION N/A 10/14/2015   Procedure: TRANSESOPHAGEAL ECHOCARDIOGRAM (TEE);  Surgeon: Thayer Headings, MD;  Location: [place];  Service: Cardiovascular;  Laterality: N/A;  . TEE WITHOUT CARDIOVERSION N/A 02/01/2016   Procedure: TRANSESOPHAGEAL ECHOCARDIOGRAM (TEE);  Surgeon: Rexene Alberts, MD;  Location: [place];  Service: Open Heart Surgery;  Laterality: N/A;  . TOTAL ABDOMINAL HYSTERECTOMY  1983   Cervical cancer 1983    There were no vitals filed for this visit.  Subjective Assessment - 06/30/18 1159    Subjective  I have to have cataract surgery so do you think Ill be done?  [facility] Adult PT Treatment/Exercise - 06/30/18 0001      Knee/Hip Exercises: Aerobic   Elliptical  L2 ramp, level 1 resistance for 7 min       Knee/Hip Exercises: Standing   Hip Extension  Stengthening;Both;1 set;10 reps    Extension Limitations  hip hinge with 1 plate freemtion shoulder ext.     Forward Step Up  Left;2 sets;10 reps;Hand Hold: 1;Step Height: 8"    Forward Step Up Limitations  then 4 inch up and over steps 1 UE x 10     Step Down  Left;1 set;15 reps;Hand Hold: 1;Step Height: 4"    Functional Squat  1 set;10 reps    Functional Squat Limitations  Free motion 1 plate x 10 with overhead press     Other  Standing Knee Exercises  standing upper trunk rotation x 1 plate with SLS onl     Other Standing Knee Exercises  squat with 1 leg on 6 inch step x 10 each leg       Knee/Hip Exercises: Supine   Straight Leg Raises  Left;1 set;20 reps    Straight Leg Raise with External Rotation  Strengthening;Left;1 set;20 reps      Knee/Hip Exercises: Sidelying   Hip ABduction  Strengthening;Both;1 set;10 reps    Hip ABduction Limitations  knee bent for lateral knee comfor     Other Sidelying Knee/Hip Exercises  small circles x 8     Other Sidelying Knee/Hip Exercises  up on elbow       Manual Therapy   Soft tissue mobilization  L quads, lateral knee/ITB              PT Education - 06/30/18 1250    Education Details  finish POC then DC, closed chain     Person(s) Educated  Patient    Methods  Explanation    Comprehension  Verbalized understanding          PT Long Term Goals - 06/30/18 1251      PT LONG TERM GOAL #1   Title  Independent with HEP    Baseline  I thus far     Status  On-going      PT LONG TERM GOAL #2   Title  Pt will increase FOTO to less than 34% or less limited.     Status  On-going      PT LONG TERM GOAL #3   Title  Left knee strength 5/5 to improve ability for stair navigation and bending/squatting motions for chores    Status  On-going      PT LONG TERM GOAL #4   Title  Descend stairs consistently with reciprocal gait for home and community mobility    Baseline  can do off a curb but only x 2     Status  On-going            Plan - 06/30/18 1252    Clinical Impression Statement  Patient worked on strengthening core, hips, knees without increased pain in L knee.  able to do step downs but used momentum at times, cues to slow pace.  PRogressing towards goals.     PT Treatment/Interventions  ADLs/Self Care Home Management;Cryotherapy;Electrical Stimulation;Moist Heat;Ultrasound;Therapeutic exercise;Therapeutic activities;Neuromuscular re-education;Manual  techniques;Passive range of motion;Dry needling;Taping;Functional mobility training;Stair training;Gait training;Patient/family education;Vasopneumatic Device    PT Next Visit Plan  cont ellpitical /bike.  Closed chain, knee control step ups and downs    PT Home Exercise Plan  level 1 knee,  added SLS and sit to stand today.     Consulted and Agree with Plan of Care  Patient       Patient will benefit from skilled therapeutic intervention in order to improve the following deficits and impairments:  Abnormal gait, Pain, Increased edema, Difficulty walking, Decreased activity tolerance, Decreased range of motion, Decreased strength  Visit Diagnosis: Left knee pain, unspecified chronicity  Stiffness of left knee, not elsewhere classified  Muscle weakness (generalized)  Difficulty in walking, not elsewhere classified  Chronic pain of left knee     Problem List Patient Active Problem List   Diagnosis Date Noted  . Acute lateral meniscus tear of right knee 05/21/2018  . Hypotension 02/15/2017  . Long term (current) use of anticoagulants [Z79.01] 02/09/2016  . History of mitral valve repair 02/01/2016    PAA,JENNIFER 06/30/2018, 12:55 PM  [facility], [place] [address] [place], [place], [address] Phone: [phone]   Fax:  [phone]  Name: Destiny Andersen MRN: [id number] Date of Birth: October 01, 1945  Raeford Razor, PT 06/30/18 12:55 PM Phone: [phone] Fax: [phone]

## 2018-07-02 ENCOUNTER — Encounter: Payer: Self-pay | Admitting: Physical Therapy

## 2018-07-02 ENCOUNTER — Ambulatory Visit: Payer: Medicare Other | Admitting: Physical Therapy

## 2018-07-02 DIAGNOSIS — M6281 Muscle weakness (generalized): Secondary | ICD-10-CM

## 2018-07-02 DIAGNOSIS — R262 Difficulty in walking, not elsewhere classified: Secondary | ICD-10-CM

## 2018-07-02 DIAGNOSIS — M25662 Stiffness of left knee, not elsewhere classified: Secondary | ICD-10-CM

## 2018-07-02 DIAGNOSIS — M25562 Pain in left knee: Secondary | ICD-10-CM

## 2018-07-02 DIAGNOSIS — G8929 Other chronic pain: Secondary | ICD-10-CM

## 2018-07-02 NOTE — Therapy (Signed)
[place], [place], [address] Phone: [phone]   Fax:  [phone]  Physical Therapy Treatment  Patient Details  Name: Destiny Andersen MRN: [id number] Date of Birth: [birth year] Referring Provider (PT): Elsie Saas, MD   Encounter Date: 07/02/2018  PT End of Session - 07/02/18 1332    Visit Number  5    Number of Visits  8    Date for PT Re-Evaluation  07/18/18    Authorization Type  UHC MCR    PT Start Time  5638    PT Stop Time  1355    PT Time Calculation (min)  51 min    Activity Tolerance  Patient tolerated treatment well    Behavior During Therapy  [facility] for tasks assessed/performed       Past Medical History:  Diagnosis Date  . Cancer [facility]) 1983   Cervical cancer.  Skin Cancer- leg  . Chronic headaches    "? Migraines"- does not have then any more  . Constipation   . Elevated C-reactive protein (CRP)   . Family history of breast cancer    Mother  . Fatigue   . Heart murmur    ECHO 02/10/13 EF = 60-65%, moderate posterior mitral valve prolapse w/possible flail segment, severe MR, mitral regurgitant jet is anteriorly directed.  Marland Kitchen History of blood transfusion    pre-eclampsia   . Hyperpotassemia    Migrated  . Medicare annual wellness visit, subsequent   . Mitral valve prolapse   . MR (mitral regurgitation)    ECHO 02/10/13 EF = 60-65%, moderate posterior mitral valve prolapse w/possible flail segment, severe MR, mitral regurgitant jet is anteriorly directed.  . Risk for falls   . Routine general medical examination at a health care facility   . S/P minimally invasive mitral valve repair 02/01/2016   Complex valvuloplasty including triangular resection of flail posterior leaflet, artificial Gore-tex neochord placement x10 and 28 mm Sorin Memo 3D ring annuloplasty via right mini thoracotomy approach  . Seizures ([place])    with pre- echa  . Shortness of breath dyspnea    with exertion     Past Surgical History:  Procedure Laterality Date  . BREAST BIOPSY Right 1998   benign cyst  . CARDIAC CATHETERIZATION N/A 12/21/2015   Procedure: Right/Left Heart Cath and Coronary Angiography;  Surgeon: Sherren Mocha, MD;  Location: [place] CV LAB;  Service: Cardiovascular;  Laterality: N/A;  . COLONOSCOPY  08/2001   Colon cancer screening in 01/2012  . LAPAROSCOPIC CHOLECYSTECTOMY  1990  . left knee arthroscopic surgery Left 05/29/2018  . MITRAL VALVE REPAIR Right 02/01/2016   Procedure: MINIMALLY INVASIVE MITRAL VALVE REPAIR (MVR) with closure of PFO;  Surgeon: Rexene Alberts, MD;  Location: [place];  Service: Open Heart Surgery;  Laterality: Right;  . TEE WITHOUT CARDIOVERSION N/A 10/14/2015   Procedure: TRANSESOPHAGEAL ECHOCARDIOGRAM (TEE);  Surgeon: Thayer Headings, MD;  Location: [place];  Service: Cardiovascular;  Laterality: N/A;  . TEE WITHOUT CARDIOVERSION N/A 02/01/2016   Procedure: TRANSESOPHAGEAL ECHOCARDIOGRAM (TEE);  Surgeon: Rexene Alberts, MD;  Location: [place];  Service: Open Heart Surgery;  Laterality: N/A;  . TOTAL ABDOMINAL HYSTERECTOMY  1983   Cervical cancer 1983    There were no vitals filed for this visit.  Subjective Assessment - 07/02/18 1316    Subjective  Its a little sore when I touch it.  Sees Dr. Noemi Chapel Feb 6th, also having cataract surgery.  I already  did my exercises today.     Currently in Pain?  No/denies          [facility] Adult PT Treatment/Exercise - 07/02/18 0001      Knee/Hip Exercises: Stretches   Active Hamstring Stretch  Left;3 reps    ITB Stretch  Left;2 reps    Gastroc Stretch  Both;2 reps    Soleus Stretch  Both;2 reps      Knee/Hip Exercises: Aerobic   Stationary Bike  5 min L3       Knee/Hip Exercises: Standing   Heel Raises  Both;1 set;10 reps    Functional Squat  1 set;10 reps    Functional Squat Limitations  with ball     Lunge Walking - Round Trips  sidestepping mini squat-walk blue band     Stairs  steps in  peds, practiced knee control desncding with 3 inch step     Rebounder  tandem LLE forward x 20, SLS with rebounder, about 2 min multiple trials , improved with practice       Knee/Hip Exercises: Seated   Long Arc Quad  Strengthening;Left;2 sets;15 reps    Long Arc Quad Weight  5 lbs.    Sit to Sand  1 set;20 reps;without UE support      Knee/Hip Exercises: Supine   Heel Slides Limitations  hamstring curl with ball x 10    Bridges Limitations  bridge with ball x 10    hip lift    Straight Leg Raises  Strengthening;Both;1 set;10 reps    Straight Leg Raises Limitations  mini ROM from ball       Cryotherapy   Number Minutes Cryotherapy  10 Minutes    Cryotherapy Location  Knee    Type of Cryotherapy  Ice pack                  PT Long Term Goals - 06/30/18 1251      PT LONG TERM GOAL #1   Title  Independent with HEP    Baseline  I thus far     Status  On-going      PT LONG TERM GOAL #2   Title  Pt will increase FOTO to less than 34% or less limited.     Status  On-going      PT LONG TERM GOAL #3   Title  Left knee strength 5/5 to improve ability for stair navigation and bending/squatting motions for chores    Status  On-going      PT LONG TERM GOAL #4   Title  Descend stairs consistently with reciprocal gait for home and community mobility    Baseline  can do off a curb but only x 2     Status  On-going            Plan - 07/02/18 1334    Clinical Impression Statement  Patient continues to benefit from PT for guided exercise and strengthening .  The only thing she really struggles with is stepping down from a 6 inch step. Advised her to work on knee control with step downs, moving more slowly.      PT Treatment/Interventions  ADLs/Self Care Home Management;Cryotherapy;Electrical Stimulation;Moist Heat;Ultrasound;Therapeutic exercise;Therapeutic activities;Neuromuscular re-education;Manual techniques;Passive range of motion;Dry needling;Taping;Functional  mobility training;Stair training;Gait training;Patient/family education;Vasopneumatic Device    PT Next Visit Plan  cont ellpitical /bike.  Closed chain, knee control step ups and downs    PT Home Exercise Plan  level 1 knee, added SLS and  sit to stand today.     Consulted and Agree with Plan of Care  Patient       Patient will benefit from skilled therapeutic intervention in order to improve the following deficits and impairments:  Abnormal gait, Pain, Increased edema, Difficulty walking, Decreased activity tolerance, Decreased range of motion, Decreased strength  Visit Diagnosis: Left knee pain, unspecified chronicity  Stiffness of left knee, not elsewhere classified  Muscle weakness (generalized)  Difficulty in walking, not elsewhere classified  Chronic pain of left knee     Problem List Patient Active Problem List   Diagnosis Date Noted  . Acute lateral meniscus tear of right knee 05/21/2018  . Hypotension 02/15/2017  . Long term (current) use of anticoagulants [Z79.01] 02/09/2016  . History of mitral valve repair 02/01/2016    Griselle Rufer 07/02/2018, 3:51 PM  [facility] [address] [place], [place], [address] Phone: [phone]   Fax:  [phone]  Name: Destiny Andersen MRN: [id number] Date of Birth: [birth year]  Raeford Razor, PT 07/02/18 3:51 PM Phone: [phone] Fax: [phone]

## 2018-07-07 ENCOUNTER — Ambulatory Visit: Payer: Medicare Other | Admitting: Physical Therapy

## 2018-07-07 ENCOUNTER — Encounter: Payer: Self-pay | Admitting: Physical Therapy

## 2018-07-07 DIAGNOSIS — M6281 Muscle weakness (generalized): Secondary | ICD-10-CM

## 2018-07-07 DIAGNOSIS — M25562 Pain in left knee: Secondary | ICD-10-CM

## 2018-07-07 DIAGNOSIS — G8929 Other chronic pain: Secondary | ICD-10-CM

## 2018-07-07 DIAGNOSIS — R262 Difficulty in walking, not elsewhere classified: Secondary | ICD-10-CM

## 2018-07-07 DIAGNOSIS — M25662 Stiffness of left knee, not elsewhere classified: Secondary | ICD-10-CM

## 2018-07-07 NOTE — Therapy (Signed)
[place], [place], [address] Phone: [phone]   Fax:  [phone]  Physical Therapy Treatment  Patient Details  Name: Destiny Andersen MRN: [id number] Date of Birth: [birth year] Referring Provider (PT): Elsie Saas, MD   Encounter Date: 07/07/2018  PT End of Session - 07/07/18 1130    Visit Number  6    Number of Visits  8    Date for PT Re-Evaluation  07/18/18    Authorization Type  UHC MCR    PT Start Time  1100    PT Stop Time  1153    PT Time Calculation (min)  53 min    Activity Tolerance  Patient tolerated treatment well    Behavior During Therapy  [facility] for tasks assessed/performed       Past Medical History:  Diagnosis Date  . Cancer [facility]) 1983   Cervical cancer.  Skin Cancer- leg  . Chronic headaches    "? Migraines"- does not have then any more  . Constipation   . Elevated C-reactive protein (CRP)   . Family history of breast cancer    Mother  . Fatigue   . Heart murmur    ECHO 02/10/13 EF = 60-65%, moderate posterior mitral valve prolapse w/possible flail segment, severe MR, mitral regurgitant jet is anteriorly directed.  Marland Kitchen History of blood transfusion    pre-eclampsia   . Hyperpotassemia    Migrated  . Medicare annual wellness visit, subsequent   . Mitral valve prolapse   . MR (mitral regurgitation)    ECHO 02/10/13 EF = 60-65%, moderate posterior mitral valve prolapse w/possible flail segment, severe MR, mitral regurgitant jet is anteriorly directed.  . Risk for falls   . Routine general medical examination at a health care facility   . S/P minimally invasive mitral valve repair 02/01/2016   Complex valvuloplasty including triangular resection of flail posterior leaflet, artificial Gore-tex neochord placement x10 and 28 mm Sorin Memo 3D ring annuloplasty via right mini thoracotomy approach  . Seizures ([place])    with pre- echa  . Shortness of breath dyspnea    with exertion     Past Surgical History:  Procedure Laterality Date  . BREAST BIOPSY Right 1998   benign cyst  . CARDIAC CATHETERIZATION N/A 12/21/2015   Procedure: Right/Left Heart Cath and Coronary Angiography;  Surgeon: Sherren Mocha, MD;  Location: [place] CV LAB;  Service: Cardiovascular;  Laterality: N/A;  . COLONOSCOPY  08/2001   Colon cancer screening in 01/2012  . LAPAROSCOPIC CHOLECYSTECTOMY  1990  . left knee arthroscopic surgery Left 05/29/2018  . MITRAL VALVE REPAIR Right 02/01/2016   Procedure: MINIMALLY INVASIVE MITRAL VALVE REPAIR (MVR) with closure of PFO;  Surgeon: Rexene Alberts, MD;  Location: [place];  Service: Open Heart Surgery;  Laterality: Right;  . TEE WITHOUT CARDIOVERSION N/A 10/14/2015   Procedure: TRANSESOPHAGEAL ECHOCARDIOGRAM (TEE);  Surgeon: Thayer Headings, MD;  Location: [place];  Service: Cardiovascular;  Laterality: N/A;  . TEE WITHOUT CARDIOVERSION N/A 02/01/2016   Procedure: TRANSESOPHAGEAL ECHOCARDIOGRAM (TEE);  Surgeon: Rexene Alberts, MD;  Location: [place];  Service: Open Heart Surgery;  Laterality: N/A;  . TOTAL ABDOMINAL HYSTERECTOMY  1983   Cervical cancer 1983    There were no vitals filed for this visit.  Subjective Assessment - 07/07/18 1109    Subjective  No problems over the weekend.  Went down the steps!    Currently in Pain?  No/denies  [place] Adult PT Treatment/Exercise - 07/07/18 0001      Knee/Hip Exercises: Stretches   Active Hamstring Stretch  Left;3 reps    ITB Stretch  Left;3 reps      Knee/Hip Exercises: Aerobic   Elliptical  L1 resist, 10 ramp for 5 min       Knee/Hip Exercises: Machines for Strengthening   Cybex Leg Press  2 plates       Knee/Hip Exercises: Standing   Forward Lunges  Both;1 set;10 reps    Forward Lunges Limitations  x 10 each side then switch, feet on ground, No UE support     Side Lunges  Both;1 set;10 reps    Lateral Step Up  Left;1 set;15 reps;Hand Hold: 0;Step Height: 6"    Forward Step Up   Left;2 sets;10 reps;Hand Hold: 1;Step Height: 4";Step Height: 6"    Forward Step Up Limitations  step up and over, min "twinge"     Lunge Walking - Round Trips  lunge hold on BOSU 30 sec each LE       Knee/Hip Exercises: Supine   Bridges  Strengthening;Both;1 set;10 reps    Bridges Limitations  toes up, ball squeeze     Single Leg Bridge  Strengthening;Both;1 set;10 reps    Other Supine Knee/Hip Exercises  knee ext with ball squeeze       Knee/Hip Exercises: Sidelying   Clams  sidelying on elbow x 15 reps blueband     Other Sidelying Knee/Hip Exercises  sideplank x 15 sec       Cryotherapy   Number Minutes Cryotherapy  10 Minutes    Cryotherapy Location  Knee    Type of Cryotherapy  Ice pack             PT Education - 07/07/18 1129    Education Details  standing lunges , alignment     Person(s) Educated  Patient    Methods  Explanation    Comprehension  Verbalized understanding          PT Long Term Goals - 06/30/18 1251      PT LONG TERM GOAL #1   Title  Independent with HEP    Baseline  I thus far     Status  On-going      PT LONG TERM GOAL #2   Title  Pt will increase FOTO to less than 34% or less limited.     Status  On-going      PT LONG TERM GOAL #3   Title  Left knee strength 5/5 to improve ability for stair navigation and bending/squatting motions for chores    Status  On-going      PT LONG TERM GOAL #4   Title  Descend stairs consistently with reciprocal gait for home and community mobility    Baseline  can do off a curb but only x 2     Status  On-going            Plan - 07/07/18 1154    Clinical Impression Statement  Patient worked on L knee control in standing and balance with BOSU.  Doing well, on track to meet goals end of POC>     PT Treatment/Interventions  ADLs/Self Care Home Management;Cryotherapy;Electrical Stimulation;Moist Heat;Ultrasound;Therapeutic exercise;Therapeutic activities;Neuromuscular re-education;Manual  techniques;Passive range of motion;Dry needling;Taping;Functional mobility training;Stair training;Gait training;Patient/family education;Vasopneumatic Device    PT Next Visit Plan  cont ellpitical /bike.  Closed chain, knee control step ups and downs    PT Home Exercise  Plan  level 1 knee, added SLS and sit to stand today.     Consulted and Agree with Plan of Care  Patient       Patient will benefit from skilled therapeutic intervention in order to improve the following deficits and impairments:  Abnormal gait, Pain, Increased edema, Difficulty walking, Decreased activity tolerance, Decreased range of motion, Decreased strength  Visit Diagnosis: Left knee pain, unspecified chronicity  Stiffness of left knee, not elsewhere classified  Muscle weakness (generalized)  Difficulty in walking, not elsewhere classified  Chronic pain of left knee     Problem List Patient Active Problem List   Diagnosis Date Noted  . Acute lateral meniscus tear of right knee 05/21/2018  . Hypotension 02/15/2017  . Long term (current) use of anticoagulants [Z79.01] 02/09/2016  . History of mitral valve repair 02/01/2016    Destiny Andersen 07/07/2018, 11:55 AM  [facility] [address] [place], [place], [address] Phone: [phone]   Fax:  [phone]  Name: Destiny Andersen MRN: [id number] Date of Birth: [birth year] Raeford Razor, PT 07/07/18 11:56 AM Phone: [phone] Fax: [phone]

## 2018-07-09 ENCOUNTER — Ambulatory Visit: Payer: Medicare Other | Admitting: Physical Therapy

## 2018-07-09 ENCOUNTER — Encounter: Payer: Self-pay | Admitting: Physical Therapy

## 2018-07-09 DIAGNOSIS — M25562 Pain in left knee: Secondary | ICD-10-CM | POA: Diagnosis not present

## 2018-07-09 DIAGNOSIS — R262 Difficulty in walking, not elsewhere classified: Secondary | ICD-10-CM

## 2018-07-09 DIAGNOSIS — M25662 Stiffness of left knee, not elsewhere classified: Secondary | ICD-10-CM

## 2018-07-09 DIAGNOSIS — M6281 Muscle weakness (generalized): Secondary | ICD-10-CM

## 2018-07-09 DIAGNOSIS — G8929 Other chronic pain: Secondary | ICD-10-CM

## 2018-07-09 NOTE — Therapy (Signed)
[place], [place], [address] Phone: [phone]   Fax:  [phone]  Physical Therapy Treatment  Patient Details  Name: Destiny Andersen MRN: [id number] Date of Birth: 1945-09-07 Referring Provider (PT): Elsie Saas, MD   Encounter Date: 07/09/2018  PT End of Session - 07/09/18 1406    Visit Number  7    Number of Visits  8    Date for PT Re-Evaluation  07/18/18    Authorization Type  UHC MCR    PT Start Time  1300    PT Stop Time  1345    PT Time Calculation (min)  45 min    Activity Tolerance  Patient tolerated treatment well    Behavior During Therapy  [facility] for tasks assessed/performed       Past Medical History:  Diagnosis Date  . Cancer [facility]) 1983   Cervical cancer.  Skin Cancer- leg  . Chronic headaches    "? Migraines"- does not have then any more  . Constipation   . Elevated C-reactive protein (CRP)   . Family history of breast cancer    Mother  . Fatigue   . Heart murmur    ECHO 02/10/13 EF = 60-65%, moderate posterior mitral valve prolapse w/possible flail segment, severe MR, mitral regurgitant jet is anteriorly directed.  Marland Kitchen History of blood transfusion    pre-eclampsia   . Hyperpotassemia    Migrated  . Medicare annual wellness visit, subsequent   . Mitral valve prolapse   . MR (mitral regurgitation)    ECHO 02/10/13 EF = 60-65%, moderate posterior mitral valve prolapse w/possible flail segment, severe MR, mitral regurgitant jet is anteriorly directed.  . Risk for falls   . Routine general medical examination at a health care facility   . S/P minimally invasive mitral valve repair 02/01/2016   Complex valvuloplasty including triangular resection of flail posterior leaflet, artificial Gore-tex neochord placement x10 and 28 mm Sorin Memo 3D ring annuloplasty via right mini thoracotomy approach  . Seizures ([place])    with pre- echa  . Shortness of breath dyspnea    with exertion     Past Surgical History:  Procedure Laterality Date  . BREAST BIOPSY Right 1998   benign cyst  . CARDIAC CATHETERIZATION N/A 12/21/2015   Procedure: Right/Left Heart Cath and Coronary Angiography;  Surgeon: Sherren Mocha, MD;  Location: [place] CV LAB;  Service: Cardiovascular;  Laterality: N/A;  . COLONOSCOPY  08/2001   Colon cancer screening in 01/2012  . LAPAROSCOPIC CHOLECYSTECTOMY  1990  . left knee arthroscopic surgery Left 05/29/2018  . MITRAL VALVE REPAIR Right 02/01/2016   Procedure: MINIMALLY INVASIVE MITRAL VALVE REPAIR (MVR) with closure of PFO;  Surgeon: Rexene Alberts, MD;  Location: [place];  Service: Open Heart Surgery;  Laterality: Right;  . TEE WITHOUT CARDIOVERSION N/A 10/14/2015   Procedure: TRANSESOPHAGEAL ECHOCARDIOGRAM (TEE);  Surgeon: [place] Headings, MD;  Location: [place];  Service: Cardiovascular;  Laterality: N/A;  . TEE WITHOUT CARDIOVERSION N/A 02/01/2016   Procedure: TRANSESOPHAGEAL ECHOCARDIOGRAM (TEE);  Surgeon: Rexene Alberts, MD;  Location: [place];  Service: Open Heart Surgery;  Laterality: N/A;  . TOTAL ABDOMINAL HYSTERECTOMY  1983   Cervical cancer 1983    There were no vitals filed for this visit.  Subjective Assessment - 07/09/18 1306    Subjective  Went to the gym yesterday and both knees are a little uncomfortable.     Currently in Pain?  Yes  Pain Score  1     Pain Location  Knee    Pain Orientation  Left;Lateral    Pain Descriptors / Indicators  Aching    Pain Type  Surgical pain    Pain Onset  More than a month ago    Pain Frequency  Intermittent    Aggravating Factors   something I did in the gym     Pain Relieving Factors  rest    Effect of Pain on Daily Activities  not a whole lot of pain, limits stairs.          [place] Adult PT Treatment/Exercise - 07/09/18 0001      Self-Care   Other Self-Care Comments   foam rolling ,POC       Knee/Hip Exercises: Aerobic   Elliptical  L2 resist, 2 ramp for 5 min       Knee/Hip  Exercises: Standing   Side Lunges  Both;1 set;10 reps    Side Lunges Limitations  used towel     Lateral Step Up  Left;1 set;15 reps;Hand Hold: 0;Step Height: 8"    Forward Step Up  Left;15 reps;Step Height: 8"    SLS with Vectors  3 way hip min UE assist with bars     Other Standing Knee Exercises  reverse lunge with towel x 10 each leg       Knee/Hip Exercises: Supine   Bridges  Strengthening;Both;1 set;15 reps    Bridges Limitations  wide knees less discomfort     Bridges with [employer]  Both;1 set;10 reps    Straight Leg Raise with External Rotation Limitations  4 way SLR with 3 lbs just LLE       Manual Therapy   Soft tissue mobilization  ITB, lateral hamstring, roll with [place]              PT Education - 07/09/18 1406    Education Details  foam rolling    Person(s) Educated  Patient    Methods  Explanation;Demonstration    Comprehension  Verbalized understanding;Returned demonstration          PT Long Term Goals - 06/30/18 1251      PT LONG TERM GOAL #1   Title  Independent with HEP    Baseline  I thus far     Status  On-going      PT LONG TERM GOAL #2   Title  Pt will increase FOTO to less than 34% or less limited.     Status  On-going      PT LONG TERM GOAL #3   Title  Left knee strength 5/5 to improve ability for stair navigation and bending/squatting motions for chores    Status  On-going      PT LONG TERM GOAL #4   Title  Descend stairs consistently with reciprocal gait for home and community mobility    Baseline  can do off a curb but only x 2     Status  On-going            Plan - 07/09/18 1407    Clinical Impression Statement  Pt was doing some lateral walking in the gym, bilateral knees were sore.  Able to reduce achiness in knees with foam rolling and exercises.     PT Treatment/Interventions  ADLs/Self Care Home Management;Cryotherapy;Electrical Stimulation;Moist Heat;Ultrasound;Therapeutic exercise;Therapeutic  activities;Neuromuscular re-education;Manual techniques;Passive range of motion;Dry needling;Taping;Functional mobility training;Stair training;Gait training;Patient/family education;Vasopneumatic Device    PT Next Visit Plan  cont ellpitical /bike.  Closed chain, knee control step ups and downs, 1 more after next visit    PT Home Exercise Plan  level 1 knee, added SLS and sit to stand today.     Consulted and Agree with Plan of Care  Patient       Patient will benefit from skilled therapeutic intervention in order to improve the following deficits and impairments:  Abnormal gait, Pain, Increased edema, Difficulty walking, Decreased activity tolerance, Decreased range of motion, Decreased strength  Visit Diagnosis: Left knee pain, unspecified chronicity  Stiffness of left knee, not elsewhere classified  Muscle weakness (generalized)  Difficulty in walking, not elsewhere classified  Chronic pain of left knee     Problem List Patient Active Problem List   Diagnosis Date Noted  . Acute lateral meniscus tear of right knee 05/21/2018  . Hypotension 02/15/2017  . Long term (current) use of anticoagulants [Z79.01] 02/09/2016  . History of mitral valve repair 02/01/2016    PAA,JENNIFER 07/09/2018, 2:11 PM  [place], [place], [address] Phone: [phone]   Fax:  [phone]  Name: JAVAE BRAATEN MRN: [id number] Date of Birth: [birth year]  Raeford Razor, PT 07/09/18 2:11 PM Phone: [phone] Fax: [phone]

## 2018-07-14 ENCOUNTER — Encounter: Payer: Self-pay | Admitting: Physical Therapy

## 2018-07-14 ENCOUNTER — Ambulatory Visit: Payer: Medicare Other | Admitting: Physical Therapy

## 2018-07-14 DIAGNOSIS — R262 Difficulty in walking, not elsewhere classified: Secondary | ICD-10-CM

## 2018-07-14 DIAGNOSIS — M6281 Muscle weakness (generalized): Secondary | ICD-10-CM

## 2018-07-14 DIAGNOSIS — M25662 Stiffness of left knee, not elsewhere classified: Secondary | ICD-10-CM

## 2018-07-14 DIAGNOSIS — M25562 Pain in left knee: Secondary | ICD-10-CM

## 2018-07-14 DIAGNOSIS — G8929 Other chronic pain: Secondary | ICD-10-CM

## 2018-07-15 NOTE — Therapy (Signed)
[place], [place], [address] Phone: [phone]   Fax:  [phone]  Physical Therapy Treatment  Patient Details  Name: Destiny Andersen MRN: [id number] Date of Birth: [birth year] Referring Provider (PT): Elsie Saas, MD   Encounter Date: 07/14/2018  PT End of Session - 07/14/18 1417    Visit Number  8    Date for PT Re-Evaluation  07/18/18    Authorization Type  UHC MCR    PT Start Time  3536    PT Stop Time  1421    PT Time Calculation (min)  53 min    Activity Tolerance  Patient tolerated treatment well    Behavior During Therapy  [facility] for tasks assessed/performed       Past Medical History:  Diagnosis Date  . Cancer [facility]) 1983   Cervical cancer.  Skin Cancer- leg  . Chronic headaches    "? Migraines"- does not have then any more  . Constipation   . Elevated C-reactive protein (CRP)   . Family history of breast cancer    Mother  . Fatigue   . Heart murmur    ECHO 02/10/13 EF = 60-65%, moderate posterior mitral valve prolapse w/possible flail segment, severe MR, mitral regurgitant jet is anteriorly directed.  Marland Kitchen History of blood transfusion    pre-eclampsia   . Hyperpotassemia    Migrated  . Medicare annual wellness visit, subsequent   . Mitral valve prolapse   . MR (mitral regurgitation)    ECHO 02/10/13 EF = 60-65%, moderate posterior mitral valve prolapse w/possible flail segment, severe MR, mitral regurgitant jet is anteriorly directed.  . Risk for falls   . Routine general medical examination at a health care facility   . S/P minimally invasive mitral valve repair 02/01/2016   Complex valvuloplasty including triangular resection of flail posterior leaflet, artificial Gore-tex neochord placement x10 and 28 mm Sorin Memo 3D ring annuloplasty via right mini thoracotomy approach  . Seizures ([place])    with pre- echa  . Shortness of breath dyspnea    with exertion    Past Surgical History:   Procedure Laterality Date  . BREAST BIOPSY Right 1998   benign cyst  . CARDIAC CATHETERIZATION N/A 12/21/2015   Procedure: Right/Left Heart Cath and Coronary Angiography;  Surgeon: Sherren Mocha, MD;  Location: [place] CV LAB;  Service: Cardiovascular;  Laterality: N/A;  . COLONOSCOPY  08/2001   Colon cancer screening in 01/2012  . LAPAROSCOPIC CHOLECYSTECTOMY  1990  . left knee arthroscopic surgery Left 05/29/2018  . MITRAL VALVE REPAIR Right 02/01/2016   Procedure: MINIMALLY INVASIVE MITRAL VALVE REPAIR (MVR) with closure of PFO;  Surgeon: Rexene Alberts, MD;  Location: [place];  Service: Open Heart Surgery;  Laterality: Right;  . TEE WITHOUT CARDIOVERSION N/A 10/14/2015   Procedure: TRANSESOPHAGEAL ECHOCARDIOGRAM (TEE);  Surgeon: [place] Headings, MD;  Location: [place];  Service: Cardiovascular;  Laterality: N/A;  . TEE WITHOUT CARDIOVERSION N/A 02/01/2016   Procedure: TRANSESOPHAGEAL ECHOCARDIOGRAM (TEE);  Surgeon: Rexene Alberts, MD;  Location: [place];  Service: Open Heart Surgery;  Laterality: N/A;  . TOTAL ABDOMINAL HYSTERECTOMY  1983   Cervical cancer 1983    There were no vitals filed for this visit.  Subjective Assessment - 07/14/18 1334    Subjective  A little sore today, not sure why.     Currently in Pain?  Yes    Pain Score  1     Pain Location  Knee    Pain Orientation  Left    Pain Descriptors / Indicators  Sore    Pain Type  Surgical pain    Pain Onset  More than a month ago    Pain Frequency  Intermittent         [facility] Adult PT Treatment/Exercise - 07/15/18 0001      Lumbar Exercises: Quadruped   Straight Leg Raise  10 reps    Other Quadruped Lumbar Exercises  knee extension with green looped band , bent knee hip ext x 10 in quadruped       Knee/Hip Exercises: Aerobic   Elliptical  L2 resist, 8 ramp for 5 min       Knee/Hip Exercises: Standing   Extension Limitations  hip hinge LLE x 15 with and without UE support, cues      Knee/Hip Exercises:  Seated   Sit to Sand  2 sets;15 reps;without UE support   with wgt shift, tried on LLE only with elevated seat      Knee/Hip Exercises: Supine   Bridges  Strengthening;Both;1 set;10 reps    Bridges Limitations  with SLR x 10     Straight Leg Raise with External Rotation  Strengthening;Left;1 set;10 reps      Knee/Hip Exercises: Prone   Hamstring Curl  1 set;15 reps    Hamstring Curl Limitations  4 lbs     Hip Extension  Strengthening;Left;1 set;15 reps    Other Prone Exercises  hip lift with knee flexed x 10 , 4 lbs                   PT Long Term Goals - 06/30/18 1251      PT LONG TERM GOAL #1   Title  Independent with HEP    Baseline  I thus far     Status  On-going      PT LONG TERM GOAL #2   Title  Pt will increase FOTO to less than 34% or less limited.     Status  On-going      PT LONG TERM GOAL #3   Title  Left knee strength 5/5 to improve ability for stair navigation and bending/squatting motions for chores    Status  On-going      PT LONG TERM GOAL #4   Title  Descend stairs consistently with reciprocal gait for home and community mobility    Baseline  can do off a curb but only x 2     Status  On-going            Plan - 07/15/18 0703    Clinical Impression Statement  Patient continues to do well, worked on hamstring strength as that continues to be her weakest muscle group. 1 more visit then DC to HEP.     PT Treatment/Interventions  ADLs/Self Care Home Management;Cryotherapy;Electrical Stimulation;Moist Heat;Ultrasound;Therapeutic exercise;Therapeutic activities;Neuromuscular re-education;Manual techniques;Passive range of motion;Dry needling;Taping;Functional mobility training;Stair training;Gait training;Patient/family education;Vasopneumatic Device    PT Next Visit Plan  cont ellpitical /bike.  Closed chain, knee control step ups and downs, DC, FOTO     PT Home Exercise Plan  level 1 knee, added SLS and sit to stand today.     Consulted and  Agree with Plan of Care  Patient       Patient will benefit from skilled therapeutic intervention in order to improve the following deficits and impairments:  Abnormal gait, Pain, Increased edema, Difficulty walking, Decreased activity tolerance, Decreased range of  motion, Decreased strength  Visit Diagnosis: Left knee pain, unspecified chronicity  Stiffness of left knee, not elsewhere classified  Muscle weakness (generalized)  Difficulty in walking, not elsewhere classified  Chronic pain of left knee     Problem List Patient Active Problem List   Diagnosis Date Noted  . Acute lateral meniscus tear of right knee 05/21/2018  . Hypotension 02/15/2017  . Long term (current) use of anticoagulants [Z79.01] 02/09/2016  . History of mitral valve repair 02/01/2016    Sunni Richardson 07/15/2018, 7:07 AM  [facility] [address] [place], [place], [address] Phone: [phone]   Fax:  [phone]  Name: LIA VIGILANTE MRN: [id number] Date of Birth: Jan 13, 1946  Raeford Razor, PT 07/15/18 7:07 AM Phone: [phone] Fax: [phone]

## 2018-07-16 ENCOUNTER — Ambulatory Visit: Payer: Medicare Other | Admitting: Physical Therapy

## 2018-07-16 ENCOUNTER — Encounter: Payer: Self-pay | Admitting: Physical Therapy

## 2018-07-16 DIAGNOSIS — M25662 Stiffness of left knee, not elsewhere classified: Secondary | ICD-10-CM

## 2018-07-16 DIAGNOSIS — M25562 Pain in left knee: Secondary | ICD-10-CM

## 2018-07-16 DIAGNOSIS — R262 Difficulty in walking, not elsewhere classified: Secondary | ICD-10-CM

## 2018-07-16 DIAGNOSIS — M6281 Muscle weakness (generalized): Secondary | ICD-10-CM

## 2018-07-16 NOTE — Therapy (Signed)
[place], [place], [address] Phone: [phone]   Fax:  [phone]  Physical Therapy Treatment  Patient Details  Name: CABELLA KIMM MRN: [id number] Date of Birth: [birth year] Referring Provider (PT): Elsie Saas, MD   Encounter Date: 07/16/2018  PT End of Session - 07/16/18 1342    Visit Number  9    PT Start Time  8315    PT Stop Time  1345    PT Time Calculation (min)  43 min    Activity Tolerance  Patient tolerated treatment well    Behavior During Therapy  [facility] for tasks assessed/performed       Past Medical History:  Diagnosis Date  . Cancer [facility]) 1983   Cervical cancer.  Skin Cancer- leg  . Chronic headaches    "? Migraines"- does not have then any more  . Constipation   . Elevated C-reactive protein (CRP)   . Family history of breast cancer    Mother  . Fatigue   . Heart murmur    ECHO 02/10/13 EF = 60-65%, moderate posterior mitral valve prolapse w/possible flail segment, severe MR, mitral regurgitant jet is anteriorly directed.  Marland Kitchen History of blood transfusion    pre-eclampsia   . Hyperpotassemia    Migrated  . Medicare annual wellness visit, subsequent   . Mitral valve prolapse   . MR (mitral regurgitation)    ECHO 02/10/13 EF = 60-65%, moderate posterior mitral valve prolapse w/possible flail segment, severe MR, mitral regurgitant jet is anteriorly directed.  . Risk for falls   . Routine general medical examination at a health care facility   . S/P minimally invasive mitral valve repair 02/01/2016   Complex valvuloplasty including triangular resection of flail posterior leaflet, artificial Gore-tex neochord placement x10 and 28 mm Sorin Memo 3D ring annuloplasty via right mini thoracotomy approach  . Seizures ([place])    with pre- echa  . Shortness of breath dyspnea    with exertion    Past Surgical History:  Procedure Laterality Date  . BREAST BIOPSY Right 1998   benign cyst   . CARDIAC CATHETERIZATION N/A 12/21/2015   Procedure: Right/Left Heart Cath and Coronary Angiography;  Surgeon: Sherren Mocha, MD;  Location: [place] CV LAB;  Service: Cardiovascular;  Laterality: N/A;  . COLONOSCOPY  08/2001   Colon cancer screening in 01/2012  . LAPAROSCOPIC CHOLECYSTECTOMY  1990  . left knee arthroscopic surgery Left 05/29/2018  . MITRAL VALVE REPAIR Right 02/01/2016   Procedure: MINIMALLY INVASIVE MITRAL VALVE REPAIR (MVR) with closure of PFO;  Surgeon: Rexene Alberts, MD;  Location: [place];  Service: Open Heart Surgery;  Laterality: Right;  . TEE WITHOUT CARDIOVERSION N/A 10/14/2015   Procedure: TRANSESOPHAGEAL ECHOCARDIOGRAM (TEE);  Surgeon: Thayer Headings, MD;  Location: [place];  Service: Cardiovascular;  Laterality: N/A;  . TEE WITHOUT CARDIOVERSION N/A 02/01/2016   Procedure: TRANSESOPHAGEAL ECHOCARDIOGRAM (TEE);  Surgeon: Rexene Alberts, MD;  Location: [place];  Service: Open Heart Surgery;  Laterality: N/A;  . TOTAL ABDOMINAL HYSTERECTOMY  1983   Cervical cancer 1983    There were no vitals filed for this visit.  Subjective Assessment - 07/16/18 1309    Subjective  No pain.  Need a list of all the things I need to do for the rest of my life.     How long can you walk comfortably?  does 10 K each day.  Has not tried to walk 30 min lately.  Currently in Pain?  No/denies         [facility] Assessment - 07/16/18 0001      Strength   Left Hip ABduction  4+/5    Left Knee Flexion  5/5    Left Knee Extension  5/5        [facility] Adult PT Treatment/Exercise - 07/16/18 0001      Self-Care   Other Self-Care Comments   HEP progression, cues for lifting       Knee/Hip Exercises: Stretches   Active Hamstring Stretch  Left;3 reps    Active Hamstring Stretch Limitations  seated, supine     ITB Stretch  Left;3 reps      Knee/Hip Exercises: Aerobic   Stationary Bike  6 min L3       Knee/Hip Exercises: Machines for Strengthening   Cybex Leg Press  2  plates x 20 , 1 plate x 10, increaed effort LLE       Knee/Hip Exercises: Standing   Functional Squat  2 sets;15 reps    Functional Squat Limitations  dead lift and squat 15, lbs cues     SLS  static 30 sec each side no difficulty static       Knee/Hip Exercises: Supine   Bridges  Strengthening;Both;1 set;20 reps    Single Leg Bridge  Strengthening;Both;1 set;10 reps             PT Education - 07/16/18 1444    Education Details  final HEP, DC     Person(s) Educated  Patient    Methods  Explanation    Comprehension  Verbalized understanding          PT Long Term Goals - 07/16/18 1317      PT LONG TERM GOAL #1   Title  Independent with HEP    Status  Achieved      PT LONG TERM GOAL #2   Title  Pt will increase FOTO to less than 34% or less limited.     Status  Achieved      PT LONG TERM GOAL #3   Title  Left knee strength 5/5 to improve ability for stair navigation and bending/squatting motions for chores    Status  Achieved      PT LONG TERM GOAL #4   Title  Descend stairs consistently with reciprocal gait for home and community mobility    Status  Achieved      PT LONG TERM GOAL #5   Title  Pt will report less than 1-2/10 pain overall with walking and stairs.6 weeks 03/12/18    Status  Achieved            Plan - 07/16/18 1446    Clinical Impression Statement  Pt has met all LTG and is pleased with her progress. Stronger and more function.  FOTO improved to goal.     PT Next Visit Plan  DC     PT Home Exercise Plan  level 1 knee, added SLS and sit to stand today., bridge     Consulted and Agree with Plan of Care  Patient       Patient will benefit from skilled therapeutic intervention in order to improve the following deficits and impairments:  Abnormal gait, Pain, Increased edema, Difficulty walking, Decreased activity tolerance, Decreased range of motion, Decreased strength  Visit Diagnosis: Left knee pain, unspecified chronicity  Stiffness of  left knee, not elsewhere classified  Muscle weakness (generalized)  Difficulty in walking,  not elsewhere classified     Problem List Patient Active Problem List   Diagnosis Date Noted  . Acute lateral meniscus tear of right knee 05/21/2018  . Hypotension 02/15/2017  . Long term (current) use of anticoagulants [Z79.01] 02/09/2016  . History of mitral valve repair 02/01/2016    PAA,JENNIFER 07/16/2018, 3:26 PM  [facility] [address] [place], [place], [address] Phone: [phone]   Fax:  [phone]  Name: KAIYANA BEDORE MRN: [id number] Date of Birth: March 05, 1946   PHYSICAL THERAPY DISCHARGE SUMMARY  Visits from Start of Care: 9  Current functional level related to goals / functional outcomes: See above , not limited by knee pain. Hopes to be back to walking 30 min daily when the weather breaks.    Remaining deficits: None limiting    Education / Equipment: HEP, gait, form with ex, safe gym ex , RICE  Plan: Patient agrees to discharge.  Patient goals were met. Patient is being discharged due to meeting the stated rehab goals.  ?????    Raeford Razor, PT 07/16/18 3:27 PM Phone: [phone] Fax: [phone]

## 2018-07-17 ENCOUNTER — Other Ambulatory Visit: Payer: Self-pay | Admitting: Cardiovascular Disease

## 2018-07-25 ENCOUNTER — Ambulatory Visit: Payer: Medicare Other

## 2018-07-31 ENCOUNTER — Ambulatory Visit
Admission: RE | Admit: 2018-07-31 | Discharge: 2018-07-31 | Disposition: A | Discharge status: Home / Self Care | Payer: Medicare Other | Source: Ambulatory Visit | Attending: Family Medicine | Admitting: Family Medicine

## 2018-07-31 DIAGNOSIS — Z1231 Encounter for screening mammogram for malignant neoplasm of breast: Secondary | ICD-10-CM

## 2018-10-16 ENCOUNTER — Telehealth: Payer: Self-pay

## 2018-10-16 NOTE — Telephone Encounter (Signed)
Left message for pt to call back about switching appt to video or phone call for appt.

## 2018-10-21 ENCOUNTER — Telehealth: Payer: Self-pay | Admitting: Cardiovascular Disease

## 2018-10-21 NOTE — Telephone Encounter (Signed)
Spoke with patient who confirmed all demographics. Patient has a smart phone and uses My Chart. Will have vitals ready for visit. °

## 2018-10-29 NOTE — Progress Notes (Signed)
Virtual Visit via Video Note   This visit type was conducted due to national recommendations for restrictions regarding the COVID-19 Pandemic (e.g. social distancing) in an effort to limit this patient's exposure and mitigate transmission in our community.  Due to her co-morbid illnesses, this patient is at least at moderate risk for complications without adequate follow up.  This format is felt to be most appropriate for this patient at this time.  All issues noted in this document were discussed and addressed.  A limited physical exam was performed with this format.  Please refer to the patient's chart for her consent to telehealth for [facility].   Date:  10/30/2018   ID:  Destiny Andersen, DOB 13-Dec-1945, MRN [id number]  Patient Location: Home Provider Location: Office  PCP:  Darcus Austin, MD (Inactive)  Cardiologist:  Mertie Moores, MD  Electrophysiologist:  None   Evaluation Performed:  Follow-Up Visit  Chief Complaint:  Follow up mitral valve repair  Destiny Andersen a 73 y.o.femalewith a history of mitral valve prolapse with severe MR s/p minimally invasive mitral valve repair and PFO closure who walked in for heart racing.  The patient reportedly was told that she had mitral valve prolapse many years ago. Approximately 3 years ago she was noted by her primary care physician to have a prominent murmur on physical exam and echocardiogram revealed the presence of mitral valve prolapse with severe mitral regurgitation. She has been followed carefully by Dr. Acie Fredrickson ever since. She has remained physically active and reportedly asymptomatic. She was seen in follow-up recently by Dr. Acie Fredrickson and transthoracic echocardiogram revealed mitral valve prolapse with a flail segment of the posterior leaflet of mitral valve and severe mitral regurgitation. Left ventricular size and systolic function remains normal. Transesophageal echocardiogram was performed, confirming the presence of a  flail segment of the posterior leaflet with ruptured chordae tendineae. L/RHC on 12/21/15 showed normal coronary arteries, normal LV function and severe MR.The patient was referred for surgical consultation.   She ultimately underwent minimally invasive mitral valve repair and PFO closure with Dr. Roxy Manns on 02/01/16. Her post op course was c/b post op thrombocytopenia and volume overload requiring diuresis. The patient was not discharged from the hospital on a beta blocker because of bradycardia during the first couple days after surgery.  Last seen in clinic 02/17/16 for  for follow up. She has been feeling pretty good since the surgery except her back hurts. EKG showed sinus tachycardia at rate of 103 bpm.  The patient was started on metorpolol 12.5mg  BID due to tachycardia by Dr. Clydene Laming 02/21/16.   Came for INR check and while walking back to car in parking lot she noted HR of 110 and came back for further evaluation. The patient denies nausea, vomiting, fever, chest pain, palpitations, shortness of breath, orthopnea, PND, dizziness, syncope, cough, congestion, abdominal pain, hematochezia, melena, lower extremity edema. Had one episode of throat sensation this morning.   Jan. 8, 2018:  Destiny Andersen is doing well.   we had increased her metoprolol slightly at her last visit  Wears her fit bit and her HR is much better  Doing cardiac rehab.   Doing well.    Has some scar tenderness and rib tenderness.   Aug. 31, 2018: Destiny Andersen is seen today for follow up of her MV repair and PFO closure.  Recent echo shows EF 55-60%.   , good repair of the MV . Mild TR Her incision has healed well It was tight ,  She used a silicone gel to help it heal .  Is walking 30 minutes a day.       Oct 30, 2018    Destiny Andersen is a 73 y.o. female with hx of mitral valve repair. No CP or dyspnea Doing well  Had knee surgery ( meniscus surgery ) , has had knee surgery in Dec. 2019 Had cataract surgery several months  ago Is scheduled for left eye cataract surgery on May 28. She is at low risk for this procedure  OK to hold ASA if requested but she doesn't think she had to for her previous eye.   Staying active, Exercising( knee is better ) , 30 minutes a day and walks her dog in addition to that  Wear a mask,   Social distancing  The patient does not have symptoms concerning for COVID-19 infection (fever, chills, cough, or new shortness of breath).    Past Medical History:  Diagnosis Date   Cancer [facility]) 1983   Cervical cancer.  Skin Cancer- leg   Chronic headaches    "? Migraines"- does not have then any more   Constipation    Elevated C-reactive protein (CRP)    Family history of breast cancer    Mother   Fatigue    Heart murmur    ECHO 02/10/13 EF = 60-65%, moderate posterior mitral valve prolapse w/possible flail segment, severe MR, mitral regurgitant jet is anteriorly directed.   History of blood transfusion    pre-eclampsia    Hyperpotassemia    Migrated   Medicare annual wellness visit, subsequent    Mitral valve prolapse    MR (mitral regurgitation)    ECHO 02/10/13 EF = 60-65%, moderate posterior mitral valve prolapse w/possible flail segment, severe MR, mitral regurgitant jet is anteriorly directed.   Risk for falls    Routine general medical examination at a health care facility    S/P minimally invasive mitral valve repair 02/01/2016   Complex valvuloplasty including triangular resection of flail posterior leaflet, artificial Gore-tex neochord placement x10 and 28 mm Sorin Memo 3D ring annuloplasty via right mini thoracotomy approach   Seizures ([place])    with pre- echa   Shortness of breath dyspnea    with exertion   Past Surgical History:  Procedure Laterality Date   BREAST BIOPSY Right 1998   benign cyst   CARDIAC CATHETERIZATION N/A 12/21/2015   Procedure: Right/Left Heart Cath and Coronary Angiography;  Surgeon: Sherren Mocha, MD;  Location: [place]  CV LAB;  Service: Cardiovascular;  Laterality: N/A;   COLONOSCOPY  08/2001   Colon cancer screening in 01/2012   LAPAROSCOPIC CHOLECYSTECTOMY  1990   left knee arthroscopic surgery Left 05/29/2018   MITRAL VALVE REPAIR Right 02/01/2016   Procedure: MINIMALLY INVASIVE MITRAL VALVE REPAIR (MVR) with closure of PFO;  Surgeon: Rexene Alberts, MD;  Location: [place];  Service: Open Heart Surgery;  Laterality: Right;   TEE WITHOUT CARDIOVERSION N/A 10/14/2015   Procedure: TRANSESOPHAGEAL ECHOCARDIOGRAM (TEE);  Surgeon: Thayer Headings, MD;  Location: [place];  Service: Cardiovascular;  Laterality: N/A;   TEE WITHOUT CARDIOVERSION N/A 02/01/2016   Procedure: TRANSESOPHAGEAL ECHOCARDIOGRAM (TEE);  Surgeon: Rexene Alberts, MD;  Location: [place];  Service: Open Heart Surgery;  Laterality: N/A;   TOTAL ABDOMINAL HYSTERECTOMY  1983   Cervical cancer 1983     Current Meds  Medication Sig   aspirin 81 MG tablet Take 81 mg by mouth daily.   calcium citrate-vitamin D (  CITRACAL+D) 315-200 MG-UNIT tablet Take 1 tablet by mouth 2 (two) times daily. VITAMIN D DOSAGE IS 250MG  INSTEAD OF 200   clindamycin (CLEOCIN) 300 MG capsule TAKE 2 CAPSULES BY MOUTH AS DIRECTED 1 HOUR PRIOR TO DENTAL WORK   Estradiol 10 MCG TABS vaginal tablet Place 1 tablet vaginally 2 (two) times a week.   metoprolol tartrate (LOPRESSOR) 25 MG tablet Take 1/2 tablet by mouth every morning and 1 tablet by mouth in the evening.     Allergies:   Penicillins; Codeine; Doxycycline hyclate; and Ultram [tramadol]   Social History   Tobacco Use   Smoking status: Former Smoker    Years: 25.00    Types: Cigarettes    Last attempt to quit: 06/18/1985    Years since quitting: 33.3   Smokeless tobacco: Never Used  Substance Use Topics   Alcohol use: Yes    Alcohol/week: 2.0 standard drinks    Types: 2 Standard drinks or equivalent per week    Comment: rare   Drug use: No     Family Hx: The patient's family history  includes AAA (abdominal aortic aneurysm) in her father; Breast cancer in her mother; CAD in her father and mother; Heart disease in her father and mother; High Cholesterol in her brother; Hypertension in her brother, father, and mother; Skin cancer in her brother.  ROS:   Please see the history of present illness.     All other systems reviewed and are negative.   Prior CV studies:   The following studies were reviewed today:    Labs/Other Tests and Data Reviewed:    EKG:  No ECG reviewed.  Recent Labs: No results found for requested labs within last 8760 hours.   Recent Lipid Panel No results found for: CHOL, TRIG, HDL, CHOLHDL, LDLCALC, LDLDIRECT  Wt Readings from Last 3 Encounters:  10/29/18 132 lb (59.9 kg)  05/21/18 130 lb (59 kg)  05/19/18 130 lb (59 kg)     Objective:    Vital Signs:  BP 103/63 (BP Location: Left Arm, Patient Position: Sitting, Cuff Size: Normal)    Pulse 68    Temp (!) 97.2 F (36.2 C)    Ht 5' 4.5" (1.638 m)    Wt 132 lb (59.9 kg)    BMI 22.31 kg/m    VITAL SIGNS:  reviewed GEN:  no acute distress EYES:  sclerae anicteric, EOMI - Extraocular Movements Intact RESPIRATORY:  normal respiratory effort, symmetric expansion CARDIOVASCULAR:  no peripheral edema SKIN:  no rash, lesions or ulcers. MUSCULOSKELETAL:  no obvious deformities. NEURO:  alert and oriented x 3, no obvious focal deficit PSYCH:  normal affect  ASSESSMENT & PLAN:    1. History of mitral valve repair: Destiny Andersen is doing very well.  Continue current medications.  She is exercising without any symptoms.  2.  Preoperative evaluation: She needs to have a left cataract surgery.  She is at low risk for her upcoming cataract surgery.  She is on aspirin 81 mg a day and she thinks that she had her previous cataract surgery while on aspirin.  I doubt that we will need to hold the aspirin but it would be okay for her to hold it for 3 to 5 days if requested by the ophthalmologist.  COVID-19  Education: The signs and symptoms of COVID-19 were discussed with the patient and how to seek care for testing (follow up with PCP or arrange E-visit).  The importance of social distancing was discussed today.  Time:  Today, I have spent  20   minutes with the patient with telehealth technology discussing the above problems.     Medication Adjustments/Labs and Tests Ordered: Current medicines are reviewed at length with the patient today.  Concerns regarding medicines are outlined above.   Tests Ordered: No orders of the defined types were placed in this encounter.   Medication Changes: No orders of the defined types were placed in this encounter.   Disposition:  Follow up in 1 year(s)  Signed, Mertie Moores, MD  10/30/2018 5:21 PM    [place]

## 2018-10-30 ENCOUNTER — Encounter: Payer: Self-pay | Admitting: Cardiovascular Disease

## 2018-10-30 ENCOUNTER — Other Ambulatory Visit: Payer: Self-pay

## 2018-10-30 ENCOUNTER — Telehealth (INDEPENDENT_AMBULATORY_CARE_PROVIDER_SITE_OTHER): Payer: Medicare Other | Admitting: Cardiovascular Disease

## 2018-10-30 VITALS — BP 103/63 | HR 68 | Temp 97.2°F | Ht 64.5 in | Wt 132.0 lb

## 2018-10-30 DIAGNOSIS — Z9889 Other specified postprocedural states: Secondary | ICD-10-CM | POA: Diagnosis not present

## 2018-10-30 DIAGNOSIS — I341 Nonrheumatic mitral (valve) prolapse: Secondary | ICD-10-CM

## 2018-10-30 DIAGNOSIS — Z7189 Other specified counseling: Secondary | ICD-10-CM

## 2018-10-30 DIAGNOSIS — I959 Hypotension, unspecified: Secondary | ICD-10-CM

## 2018-10-30 NOTE — Patient Instructions (Signed)

## 2018-12-11 ENCOUNTER — Other Ambulatory Visit: Payer: Self-pay | Admitting: Cardiovascular Disease

## 2019-06-29 ENCOUNTER — Other Ambulatory Visit: Payer: Self-pay | Admitting: Family Medicine

## 2019-06-29 DIAGNOSIS — Z1231 Encounter for screening mammogram for malignant neoplasm of breast: Secondary | ICD-10-CM

## 2019-07-10 ENCOUNTER — Ambulatory Visit: Payer: Medicare PPO | Attending: Internal Medicine

## 2019-07-10 DIAGNOSIS — Z23 Encounter for immunization: Secondary | ICD-10-CM | POA: Insufficient documentation

## 2019-07-10 NOTE — Progress Notes (Signed)
   Covid-19 Vaccination Clinic  Name:  Destiny Andersen    MRN: [id number] DOB: [birth year]  07/10/2019  [name] was observed post Covid-19 immunization for 15 minutes without incidence. She was provided with Vaccine Information Sheet and instruction to access the V-Safe system.   [name] was instructed to call 911 with any severe reactions post vaccine: Marland Kitchen Difficulty breathing  . Swelling of your face and throat  . A fast heartbeat  . A bad rash all over your body  . Dizziness and weakness    Immunizations Administered    Name Date Dose VIS Date Route   Pfizer COVID-19 Vaccine 07/10/2019  3:08 PM 0.3 mL 05/29/2019 Intramuscular   Manufacturer: [place]   Lot: [id number]   [place]: [id number]

## 2019-07-31 ENCOUNTER — Ambulatory Visit: Payer: Medicare PPO | Attending: Internal Medicine

## 2019-07-31 DIAGNOSIS — Z23 Encounter for immunization: Secondary | ICD-10-CM

## 2019-07-31 NOTE — Progress Notes (Signed)
   Covid-19 Vaccination Clinic  Name:  ATAYA CACAL    MRN: [id number] DOB: 02/14/1946  07/31/2019  Ms. Rivers was observed post Covid-19 immunization for 15 minutes without incidence. She was provided with Vaccine Information Sheet and instruction to access the V-Safe system.   Ms. Neve was instructed to call 911 with any severe reactions post vaccine: Marland Kitchen Difficulty breathing  . Swelling of your face and throat  . A fast heartbeat  . A bad rash all over your body  . Dizziness and weakness    Immunizations Administered    Name Date Dose VIS Date Route   Pfizer COVID-19 Vaccine 07/31/2019 10:26 AM 0.3 mL 05/29/2019 Intramuscular   Manufacturer: [place]   Lot: [id number]   [place]: [id number]

## 2019-08-05 ENCOUNTER — Other Ambulatory Visit: Payer: Self-pay

## 2019-08-05 ENCOUNTER — Ambulatory Visit
Admission: RE | Admit: 2019-08-05 | Discharge: 2019-08-05 | Disposition: A | Discharge status: Home / Self Care | Payer: Medicare PPO | Source: Ambulatory Visit | Attending: Family Medicine | Admitting: Family Medicine

## 2019-08-05 DIAGNOSIS — Z1231 Encounter for screening mammogram for malignant neoplasm of breast: Secondary | ICD-10-CM

## 2019-08-14 ENCOUNTER — Ambulatory Visit: Payer: Medicare Other

## 2019-09-29 ENCOUNTER — Ambulatory Visit: Payer: Medicare PPO | Admitting: Sports Medicine

## 2019-09-29 ENCOUNTER — Other Ambulatory Visit: Payer: Self-pay

## 2019-09-29 VITALS — BP 118/72 | Ht 64.0 in | Wt 132.0 lb

## 2019-09-29 DIAGNOSIS — M25562 Pain in left knee: Secondary | ICD-10-CM

## 2019-09-29 DIAGNOSIS — G8929 Other chronic pain: Secondary | ICD-10-CM | POA: Diagnosis not present

## 2019-09-29 DIAGNOSIS — M79672 Pain in left foot: Secondary | ICD-10-CM | POA: Diagnosis not present

## 2019-09-29 NOTE — Progress Notes (Addendum)
SUBJECTIVE:   CHIEF COMPLAINT / HPI:   Left heel pain Destiny Andersen reports that about 12 days ago, she was wearing the same shoes throughout the day and walked around [facility] for a long time, and afterwards she felt pain on the proximal plantar surface of the left heel.  She says that it felt like a "bone bruise."  She denied any swelling, numbness, or tingling of the left foot.  She also denied any trauma to the foot.  She does have a history of plantar fasciitis several decades ago, and she stretches her feet before getting out of bed every morning for this reason.  She cannot tolerate NSAIDs due to chronic kidney disease and has not tried anything else for relief.  She says that sometimes the pain spreads distally on the plantar surface of her foot.  Chronic left knee pain and stiffness Patient reports that she has had several years of chronic knee pain.  She also tore her meniscus, and this was repaired in December 2019.  She recovered well from the surgery, but continues to have stiffness and soreness of the left knee, especially along the lateral aspect.  She has been told by Dr. Noemi Chapel, who performed her meniscal repair, as well as another healthcare provider who saw her knee x-rays that she does not have much arthritis in her left knee.  She does report some popping of that joint but denies any catching or locking sensation.  Her pain is exacerbated by being on her feet for a long time, although she tries to stay active every day.  PERTINENT  PMH / PSH: Chronic kidney disease, acute lateral meniscus tear of left knee  OBJECTIVE:   BP 118/72   Ht 5\' 4"  (1.626 m)   Wt 132 lb (59.9 kg)   BMI 22.66 kg/m   Left foot: No visual abnormality, including erythema or swelling.  Tender to palpation of the plantar surface of the calcaneus.  ROM normal on dorsiflexion, plantarflexion, abduction, abduction, eversion, and inversion, and patient has 5/5 strength in all planes.  Neurovascularly  intact. Right foot: No visual abnormality, including erythema or swelling.  Nontender to palpation throughout.  ROM normal on dorsiflexion, plantarflexion, abduction, abduction, eversion, and inversion, and patient has 5/5 strength in all planes.  Neurovascularly intact. Knee, left: Inspection was negative for erythema, negative for effusion, and negative for obvious bony abnormalities. Palpation was negative for obvious Baker's cyst development, negative for asymmetric warmth, negative for joint line tenderness, negative for condyle tenderness, negative for patellar tenderness, negative for patellar crepitus, but positive for tenderness along the lateral aspect of the knee extending to the leg. Patellar and quadriceps tendons unremarkable. ROM normal in flexion (135 degrees) and extension (0 degrees) and lower leg rotation. Normal hamstring and quadriceps strength. Neurovascularly intact bilaterally.  - Ligaments: (Solid and consistent endpoints)   - ACL (present bilaterally)   - PCL (present bilaterally)   - LCL (present bilaterally)   - MCL (present bilaterally).   - Additional tests performed:    - Anterior Drawer >> neg   - Posterior Drawer >> neg  - Meniscus:   - McMurray's: negative  Knee, right: Inspection was negative for erythema, negative for effusion, and negative for obvious bony abnormalities. Palpation was negative for obvious Baker's cyst development, negative for asymmetric warmth, negative for joint line tenderness, negative for condyle tenderness, negative for patellar tenderness, negative for patellar crepitus. Patellar and quadriceps tendons unremarkable. ROM normal in flexion (135 degrees) and  extension (0 degrees) and lower leg rotation. Normal hamstring and quadriceps strength. Neurovascularly intact bilaterally.  [place] left foot: No effusion noted.  Plantar fascia appears within normal limits.  Bone spur is visualized on the calcaneus.  ASSESSMENT/PLAN:   Pain of left  heel Acute.  Most likely inflammation of the plantar fascia that was brought on by walking for a long period 12 days ago.  Bone spur of the calcaneus could be causing some irritation of the plantar fascia, and it is likely too early in the course of inflammation to see changes of the plantar fascia on ultrasound.  Recommended stretching of the plantar fascia daily before walking, and the stretch was demonstrated to the patient.  She will add these to her other stretches.  Also recommended that patient ice the area daily.  She will return in 3 weeks if she has not experienced significant improvement.  Chronic pain of left knee Patient could have an element of osteoarthritis, although the lack of osteoarthritis noted during meniscal repair surgery is reassuring.  Given patient's stiffness on the lateral aspect of the knee spreading into the proximal leg, will attempt to treat with IT band stretching.  We will be conservative in treatment since this pain does not significantly inhibit patient's daily life.  She will follow up with [place] in 3 weeks if her pain has not significantly improved.     Kathrene Alu, MD [place]   Patient seen and evaluated with the resident.  I agree with the above plan of care.  Although patient's presentation is not classic for plantar fasciitis, she does have a small spur on ultrasound.  She has a history of plantar fasciitis and has been doing plantar fascial exercises for years but has not been doing any plantar fascial stretching.  She will add that to her daily routine.  As long as she continues to improve then we will not need to pursue further work-up or treatment. Patient has chronic pain of the left knee as well.  Status post left knee arthroscopy done by Dr. Noemi Chapel a little over a year ago.  Per the patient's report, Dr. Noemi Chapel told her that she had "very little" in the way of arthritis.  Refer to give her some simple IT band stretches to see  if that will help.  Follow-up for ongoing or recalcitrant issues.

## 2019-09-29 NOTE — Assessment & Plan Note (Signed)
Patient could have an element of osteoarthritis, although the lack of osteoarthritis noted during meniscal repair surgery is reassuring.  Given patient's stiffness on the lateral aspect of the knee spreading into the proximal leg, will attempt to treat with IT band stretching.  We will be conservative in treatment since this pain does not significantly inhibit patient's daily life.  She will follow up with [place] in 3 weeks if her pain has not significantly improved.

## 2019-09-29 NOTE — Assessment & Plan Note (Addendum)
Acute.  Most likely inflammation of the plantar fascia that was brought on by walking for a long period 12 days ago.  Bone spur of the calcaneus could be causing some irritation of the plantar fascia, and it is likely too early in the course of inflammation to see changes of the plantar fascia on ultrasound.  Recommended stretching of the plantar fascia daily before walking, and the stretch was demonstrated to the patient.  She will add these to her other stretches.  Also recommended that patient ice the area daily.  She will return in 3 weeks if she has not experienced significant improvement.

## 2019-10-20 ENCOUNTER — Other Ambulatory Visit: Payer: Self-pay

## 2019-10-20 ENCOUNTER — Ambulatory Visit
Admission: RE | Admit: 2019-10-20 | Discharge: 2019-10-20 | Disposition: A | Discharge status: Home / Self Care | Payer: Medicare PPO | Source: Ambulatory Visit | Attending: Sports Medicine | Admitting: Sports Medicine

## 2019-10-20 ENCOUNTER — Ambulatory Visit: Payer: Medicare PPO | Admitting: Sports Medicine

## 2019-10-20 VITALS — BP 102/72 | Ht 64.0 in | Wt 132.0 lb

## 2019-10-20 DIAGNOSIS — M79672 Pain in left foot: Secondary | ICD-10-CM

## 2019-10-20 NOTE — Progress Notes (Addendum)
   Subjective:    Patient ID: Destiny Andersen, female    DOB: 26-Feb-1946, 74 y.o.   MRN: [id number]  HPI   Patient comes in today for follow-up on left knee and left heel pain.  Left knee is feeling much better.  She has been compliant with her home exercises.  Left heel pain has improved but not resolved.  She still has pain along the posterior plantar aspect of the heel with prolonged walking.  She localizes the pain just distal to the Achilles tendon and just proximal to the plantar fascia.  The only pair of comfortable walking shoes are a pair from many years ago.  She has been doing her plantar fascial stretches.    Review of Systems As above    Objective:   Physical Exam  Well-developed, well-nourished.  No acute distress  Left heel: Tenderness to palpation just distal to the Achilles tendon on the calcaneus.  It is proximal to the plantar fascia.  No tenderness to palpation over the origin of the plantar fascia.  No swelling.  Neurovascularly intact distally.      Assessment & Plan:   Improving left knee pain Persistent left heel pain  Patient will continue with home exercises for the left knee.  I have encouraged isometric quad exercises in addition to the exercises that she was previously shown for her left hip. For her left heel pain, we are going to try a pair of green sports insoles.  I have encouraged her to purchase a new pair of comfortable walking shoes.  I will also check an x-ray of the left heel to rule out any bony abnormality.  Phone follow-up with those results when available.  If x-ray is unremarkable patient will follow-up as needed.  Addendum: X-rays of the left calcaneus reviewed.  Other than the small plantar spur seen on her previous ultrasound, x-rays are unremarkable.  Patient will be notified via telephone of these results.

## 2019-11-12 ENCOUNTER — Ambulatory Visit (INDEPENDENT_AMBULATORY_CARE_PROVIDER_SITE_OTHER): Payer: Medicare PPO | Admitting: Cardiovascular Disease

## 2019-11-12 ENCOUNTER — Other Ambulatory Visit: Payer: Self-pay

## 2019-11-12 ENCOUNTER — Encounter: Payer: Self-pay | Admitting: Cardiovascular Disease

## 2019-11-12 VITALS — BP 102/62 | HR 76 | Ht 64.0 in | Wt 130.0 lb

## 2019-11-12 DIAGNOSIS — Z9889 Other specified postprocedural states: Secondary | ICD-10-CM

## 2019-11-12 NOTE — Patient Instructions (Signed)

## 2019-11-12 NOTE — Progress Notes (Signed)
Cardiology Office Note    Date:  11/12/2019   ID:  Destiny Andersen, DOB 09/06/1945, MRN [id number]  PCP:  Lujean Amel, MD  Cardiologist:  Dr. Acie Fredrickson  Chief Complaint: s/p MV repair  History of Present Illness:   Destiny Andersen is a 74 y.o. female with a history of mitral valve prolapse with severe MR s/p minimally invasive mitral valve repair and PFO closure who walked in for heart racing.  The patient reportedly was told that she had mitral valve prolapse many years ago. Approximately 3 years ago she was noted by her primary care physician to have a prominent murmur on physical exam and echocardiogram revealed the presence of mitral valve prolapse with severe mitral regurgitation. She has been followed carefully by Dr. Acie Fredrickson ever since. She has remained physically active and reportedly asymptomatic. She was seen in follow-up recently by Dr. Acie Fredrickson and transthoracic echocardiogram revealed mitral valve prolapse with a flail segment of the posterior leaflet of mitral valve and severe mitral regurgitation. Left ventricular size and systolic function remains normal. Transesophageal echocardiogram was performed, confirming the presence of a flail segment of the posterior leaflet with ruptured chordae tendineae. L/RHC on 12/21/15 showed normal coronary arteries, normal LV function and severe MR. The patient was referred for surgical consultation.   She ultimately underwent minimally invasive mitral valve repair and PFO closure with Dr. Roxy Manns on 02/01/16. Her post op course was c/b post op thrombocytopenia and volume overload requiring diuresis. The patient was not discharged from the hospital on a beta blocker because of bradycardia during the first couple days after surgery.  Last seen in clinic 02/17/16 for  for follow up. She has been feeling pretty good since the surgery except her back hurts. EKG showed sinus tachycardia at rate of 103 bpm.  The patient was started on metorpolol 12.5mg  BID  due to tachycardia by Dr. Clydene Laming 02/21/16.   Came for INR check and while walking back to car in parking lot she noted HR of 110 and came back for further evaluation. The patient denies nausea, vomiting, fever, chest pain, palpitations, shortness of breath, orthopnea, PND, dizziness, syncope, cough, congestion, abdominal pain, hematochezia, melena, lower extremity edema. Had one episode of throat sensation this morning.   Jan. 8, 2018:  Destiny Andersen is doing well.   we had increased her metoprolol slightly at her last visit  Wears her fit bit and her HR is much better  Doing cardiac rehab.   Doing well.    Has some scar tenderness and rib tenderness.   Aug. 31, 2018: Destiny Andersen is seen today for follow up of her MV repair and PFO closure.  Recent echo shows EF 55-60%.   , good repair of the MV . Mild TR Her incision has healed well It was tight ,   She used a silicone gel to help it heal .  Is walking 30 minutes a day.     Nov 12, 2019:  overal doing well Exercising 16-18 min a day  - limited by a bone spur.  Is seeing sport medicine   Past Medical History:  Diagnosis Date  . Cancer [facility]) 1983   Cervical cancer.  Skin Cancer- leg  . Chronic headaches    "? Migraines"- does not have then any more  . Constipation   . Elevated C-reactive protein (CRP)   . Family history of breast cancer    Destiny Andersen  . Fatigue   . Heart murmur    ECHO 02/10/13 EF =  60-65%, moderate posterior mitral valve prolapse w/possible flail segment, severe MR, mitral regurgitant jet is anteriorly directed.  Marland Kitchen History of blood transfusion    pre-eclampsia   . Hyperpotassemia    Migrated  . Medicare annual wellness visit, subsequent   . Mitral valve prolapse   . MR (mitral regurgitation)    ECHO 02/10/13 EF = 60-65%, moderate posterior mitral valve prolapse w/possible flail segment, severe MR, mitral regurgitant jet is anteriorly directed.  . Risk for falls   . Routine general medical examination at a health care facility    . S/P minimally invasive mitral valve repair 02/01/2016   Complex valvuloplasty including triangular resection of flail posterior leaflet, artificial Gore-tex neochord placement x10 and 28 mm Sorin Memo 3D ring annuloplasty via right mini thoracotomy approach  . Seizures ([place])    with pre- echa  . Shortness of breath dyspnea    with exertion    Past Surgical History:  Procedure Laterality Date  . BREAST BIOPSY Right 1998   benign cyst  . CARDIAC CATHETERIZATION N/A 12/21/2015   Procedure: Right/Left Heart Cath and Coronary Angiography;  Surgeon: Sherren Mocha, MD;  Location: [place] [facility];  Service: Cardiovascular;  Laterality: N/A;  . COLONOSCOPY  08/2001   Colon cancer screening in 01/2012  . LAPAROSCOPIC CHOLECYSTECTOMY  1990  . left knee arthroscopic surgery Left 05/29/2018  . MITRAL VALVE REPAIR Right 02/01/2016   Procedure: MINIMALLY INVASIVE MITRAL VALVE REPAIR (MVR) with closure of PFO;  Surgeon: Rexene Alberts, MD;  Location: [facility] [place];  Service: Open Heart Surgery;  Laterality: Right;  . TEE WITHOUT CARDIOVERSION N/A 10/14/2015   Procedure: TRANSESOPHAGEAL ECHOCARDIOGRAM (TEE);  Surgeon: Thayer Headings, MD;  Location: [place];  Service: Cardiovascular;  Laterality: N/A;  . TEE WITHOUT CARDIOVERSION N/A 02/01/2016   Procedure: TRANSESOPHAGEAL ECHOCARDIOGRAM (TEE);  Surgeon: Rexene Alberts, MD;  Location: [facility];  Service: Open Heart Surgery;  Laterality: N/A;  . TOTAL ABDOMINAL HYSTERECTOMY  1983   Cervical cancer 1983    Current Medications: Prior to Admission medications   Medication Sig Start Date End Date Taking? Authorizing Provider  acetaminophen (TYLENOL) 325 MG tablet Take 650 mg by mouth every 6 (six) hours as needed.    Historical Provider, MD  aspirin 81 MG tablet Take 81 mg by mouth daily.    Historical Provider, MD  calcium citrate-vitamin D (CITRACAL+D) 315-200 MG-UNIT tablet Take 1 tablet by mouth 3 (three) times daily. VITAMIN D DOSAGE IS 250MG  INSTEAD  OF 200    Historical Provider, MD  diclofenac sodium (VOLTAREN) 1 % GEL Apply 2 g topically as needed (back pain). 02/07/16   Donielle Liston Alba, PA-C  metoprolol tartrate (LOPRESSOR) 25 MG tablet Take 0.5 tablets (12.5 mg total) by mouth 2 (two) times daily. 02/21/16   Rexene Alberts, MD  traMADol (ULTRAM) 50 MG tablet Take 1 tablet (50 mg total) by mouth every 6 (six) hours as needed for moderate pain. 02/07/16   Donielle Liston Alba, PA-C  warfarin (COUMADIN) 2.5 MG tablet Take 1 to 1.5 tablets by mouth daily as directed by coumadin clinic 03/09/16   Thayer Headings, MD    Allergies:   Penicillins, Codeine, Doxycycline hyclate, and Ultram [tramadol]   Social History   Socioeconomic History  . Marital status: Married    Spouse name: Not on file  . Number of children: 1  . Years of education: Not on file  . Highest education level: Not on file  Occupational History  .  Not on file  Tobacco Use  . Smoking status: Former Smoker    Years: 25.00    Types: Cigarettes    Quit date: 06/18/1985    Years since quitting: 34.4  . Smokeless tobacco: Never Used  Substance and Sexual Activity  . Alcohol use: Yes    Alcohol/week: 2.0 standard drinks    Types: 2 Standard drinks or equivalent per week    Comment: rare  . Drug use: No  . Sexual activity: Never  Other Topics Concern  . Not on file  Social History Narrative  . Not on file   Social Determinants of Health   Financial Resource Strain:   . Difficulty of Paying Living Expenses:   Food Insecurity:   . Worried About Charity fundraiser in the Last Year:   . Arboriculturist in the Last Year:   Transportation Needs:   . Film/video editor (Medical):   Marland Kitchen Lack of Transportation (Non-Medical):   Physical Activity:   . Days of Exercise per Week:   . Minutes of Exercise per Session:   Stress:   . Feeling of Stress :   Social Connections:   . Frequency of Communication with Friends and Family:   . Frequency of Social Gatherings  with Friends and Family:   . Attends Religious Services:   . Active Member of Clubs or Organizations:   . Attends Archivist Meetings:   Marland Kitchen Marital Status:      Family History:  The patient's family history includes AAA (abdominal aortic aneurysm) in her father; Breast cancer in her Destiny Andersen; CAD in her father and Destiny Andersen; Heart disease in her father and Destiny Andersen; High Cholesterol in her brother; Hypertension in her brother, father, and Destiny Andersen; Skin cancer in her brother.   ROS:   Please see the history of present illness.    ROS All other systems reviewed and are negative.   PHYSICAL EXAM:   VS:  BP 102/62   Pulse 76   Ht 5\' 4"  (1.626 m)   Wt 130 lb (59 kg)   SpO2 99%   BMI 22.31 kg/m    GEN: thin anxious female in no acute distress  HEENT: normal  Neck: no JVD, carotid bruits, or masses Cardiac: RRR; no murmurs, rubs, or gallops,no edema  Her upper right chest scar is tender. Has right rib tenderness.   Respiratory:  clear to auscultation bilaterally, normal work of breathing GI: soft, nontender, nondistended, + BS MS: no deformity or atrophy  Skin: warm and dry, no rash Neuro:  Alert and Oriented x 3, Strength and sensation are intact Psych: euthymic mood, full affect  Wt Readings from Last 3 Encounters:  11/12/19 130 lb (59 kg)  10/20/19 132 lb (59.9 kg)  09/29/19 132 lb (59.9 kg)      Studies/Labs Reviewed:   EKG: Nov 12, 2019: Normal sinus rhythm at 76.  Occasional premature atrial contractions..   Recent Labs: No results found for requested labs within last 8760 hours.   Lipid Panel No results found for: CHOL, TRIG, HDL, CHOLHDL, VLDL, LDLCALC, LDLDIRECT  Additional studies/ records that were reviewed today include:   As above  ASSESSMENT & PLAN:   1.    Hypotension:    Blood pressure is well controlled.  Continue current medications.  2. Mitral valve prolapse with severe MR:    She status post mitral valve repair.  She is doing well.  She will  continue to take clindamycin SBE  prophylaxis. She has frequent episodes of vaginal candidiasis.   I suggested that she try Diflucan along with the clindamycin.  She will check with her new GYN doctor   Will see her in 1 year.     Signed, Mertie Moores, MD  11/12/2019 3:03 PM    [facility] [place], [place], [place]  [address] Phone: [phone]; Fax: [phone]

## 2019-11-28 ENCOUNTER — Other Ambulatory Visit: Payer: Self-pay | Admitting: Cardiovascular Disease

## 2020-01-21 DIAGNOSIS — R0602 Shortness of breath: Secondary | ICD-10-CM | POA: Diagnosis not present

## 2020-01-22 DIAGNOSIS — R519 Headache, unspecified: Secondary | ICD-10-CM | POA: Diagnosis not present

## 2020-01-22 DIAGNOSIS — R432 Parageusia: Secondary | ICD-10-CM | POA: Diagnosis not present

## 2020-01-22 DIAGNOSIS — Z03818 Encounter for observation for suspected exposure to other biological agents ruled out: Secondary | ICD-10-CM | POA: Diagnosis not present

## 2020-01-27 ENCOUNTER — Telehealth: Payer: Self-pay | Admitting: Cardiovascular Disease

## 2020-01-27 NOTE — Telephone Encounter (Signed)
Called and spoke to patient. She states that she has been having intermittent episodes for the past 10 days of her heart racing and also states that she has had some dizziness with standing, denies syncope. She states that this morning she was getting ready to start her walk outside and her heart started racing and felt that it was pounding in her chest. She did not have any vitals available. She states that she became very SOB as well. She rested and Sx resolved. She takes metoprolol tartrate 12.5 mg in the AM and 25 pm in the PM.   She denies excessive caffeine or alcohol intake, denies having any new stress in her life, and denies starting any new meds. Patient states that she has been staying well hydrated.  Patient s/p MV repair, last echo in 2018 looked good. No monitor on file.     Patient was given an appointment by operator for this Friday with Dr. Acie Fredrickson. She states that she just wants someone to listen to her heart to make sure it is okay and that she is not having any irregular heart rhythms. She states that she had a visit with Dr. Lindell Noe at [facility] [place] last week for HA, productive cough, change in her taste, and fatigue. Denies fever. She states that she was tested for covid and that it came back negative, but since her Sx continue she thinks that this may be a false negative. These results are not in Epic (I have requested that these be faxed to our office). Will forward to Dr. Acie Fredrickson to see if he would like for patient to keep appointment on Friday if results are negative, have the patient retested since Sx continue before coming into the office, order a monitor, or any other recommendation.

## 2020-01-27 NOTE — Telephone Encounter (Signed)
Left message to call back  

## 2020-01-27 NOTE — Telephone Encounter (Signed)
STAT if HR is under 50 or over 120 (normal HR is 60-100 beats per minute)  1) What is your heart rate?  Did not have any reading- said it was slamming this morning before she started her walk  2) Do you have a log of your heart rate readings (document readings)?no  3) Do you have any other symptoms?  The last week, when she stand up, feels a little dizzy-pt wanted to be seen-this week- I made an appt for 01-29-20(Friday) please call to evaluate

## 2020-01-27 NOTE — Telephone Encounter (Signed)
Negative COVID results have been scanned under media tab.

## 2020-01-28 DIAGNOSIS — Z20828 Contact with and (suspected) exposure to other viral communicable diseases: Secondary | ICD-10-CM | POA: Diagnosis not present

## 2020-01-28 NOTE — Telephone Encounter (Signed)
Spoke with Dr. Acie Fredrickson. We will keep her appointment with him tomorrow and see if she can be re-tested today and touch base with her tomorrow morning.   Called and spoke to patient and made her aware. She is going to work on getting re-tested today and I will call her in the morning.

## 2020-01-28 NOTE — Progress Notes (Signed)
Cardiology Office Note    Date:  01/29/2020   ID:  Destiny Andersen, DOB Dec 01, 1945, MRN [id number]  PCP:  Lujean Amel, MD  Cardiologist:  Dr. Acie Fredrickson  Chief Complaint: s/p MV repair  History of Present Illness:   Destiny Andersen is a 74 y.o. female with a history of mitral valve prolapse with severe MR s/p minimally invasive mitral valve repair and PFO closure who walked in for heart racing.  The patient reportedly was told that she had mitral valve prolapse many years ago. Approximately 3 years ago she was noted by her primary care physician to have a prominent murmur on physical exam and echocardiogram revealed the presence of mitral valve prolapse with severe mitral regurgitation. She has been followed carefully by Dr. Acie Fredrickson ever since. She has remained physically active and reportedly asymptomatic. She was seen in follow-up recently by Dr. Acie Fredrickson and transthoracic echocardiogram revealed mitral valve prolapse with a flail segment of the posterior leaflet of mitral valve and severe mitral regurgitation. Left ventricular size and systolic function remains normal. Transesophageal echocardiogram was performed, confirming the presence of a flail segment of the posterior leaflet with ruptured chordae tendineae. L/RHC on 12/21/15 showed normal coronary arteries, normal LV function and severe MR. The patient was referred for surgical consultation.   She ultimately underwent minimally invasive mitral valve repair and PFO closure with Dr. Roxy Manns on 02/01/16. Her post op course was c/b post op thrombocytopenia and volume overload requiring diuresis. The patient was not discharged from the hospital on a beta blocker because of bradycardia during the first couple days after surgery.  Last seen in clinic 02/17/16 for  for follow up. She has been feeling pretty good since the surgery except her back hurts. EKG showed sinus tachycardia at rate of 103 bpm.  The patient was started on metorpolol 12.5mg  BID  due to tachycardia by Dr. Clydene Laming 02/21/16.   Came for INR check and while walking back to car in parking lot she noted HR of 110 and came back for further evaluation. The patient denies nausea, vomiting, fever, chest pain, palpitations, shortness of breath, orthopnea, PND, dizziness, syncope, cough, congestion, abdominal pain, hematochezia, melena, lower extremity edema. Had one episode of throat sensation this morning.   Jan. 8, 2018:  Destiny Andersen is doing well.   we had increased her metoprolol slightly at her last visit  Wears her fit bit and her HR is much better  Doing cardiac rehab.   Doing well.    Has some scar tenderness and rib tenderness.   Aug. 31, 2018: Destiny Andersen is seen today for follow up of her MV repair and PFO closure.  Recent echo shows EF 55-60%.   , good repair of the MV . Mild TR Her incision has healed well It was tight ,   She used a silicone gel to help it heal .  Is walking 30 minutes a day.     Nov 12, 2019:  overal doing well Exercising 16-18 min a day  - limited by a bone spur.  Is seeing sport medicine   January 29, 2020: Destiny Andersen is seen today for follow-up visit.  She has been having creasing palpitations for the past several weeks.  She has had her upper respiratory tract infection.  She tested negative for Covid last week. Covid tested yesterday was also negative   Has not felt well for the past 2 weeks  Fatigued,  Taste is altered , sleeping lots Staying hydrated.   Drinks  plenty of fluids  On Wednesday , she was waking her dog,  Started having significant palpitations,  Very hard heart beats  Was wiped out  Recovered after 2-3 minutes.  Has not walked since then   Eating and drinking normally .  Has had orthostasis for the past 2 weeks.   Symptoms are c/w dehydration  - she drinks maybe 50 oz of water a day  No blood in stool, + fever - temp 99.9 today ,  99.7 yesterday    Past Medical History:  Diagnosis Date  . Cancer [facility]) 1983   Cervical cancer.   Skin Cancer- leg  . Chronic headaches    "? Migraines"- does not have then any more  . Constipation   . Elevated C-reactive protein (CRP)   . Family history of breast cancer    Mother  . Fatigue   . Heart murmur    ECHO 02/10/13 EF = 60-65%, moderate posterior mitral valve prolapse w/possible flail segment, severe MR, mitral regurgitant jet is anteriorly directed.  Marland Kitchen History of blood transfusion    pre-eclampsia   . Hyperpotassemia    Migrated  . Medicare annual wellness visit, subsequent   . Mitral valve prolapse   . MR (mitral regurgitation)    ECHO 02/10/13 EF = 60-65%, moderate posterior mitral valve prolapse w/possible flail segment, severe MR, mitral regurgitant jet is anteriorly directed.  . Risk for falls   . Routine general medical examination at a health care facility   . S/P minimally invasive mitral valve repair 02/01/2016   Complex valvuloplasty including triangular resection of flail posterior leaflet, artificial Gore-tex neochord placement x10 and 28 mm Sorin Memo 3D ring annuloplasty via right mini thoracotomy approach  . Seizures ([place])    with pre- echa  . Shortness of breath dyspnea    with exertion    Past Surgical History:  Procedure Laterality Date  . BREAST BIOPSY Right 1998   benign cyst  . CARDIAC CATHETERIZATION N/A 12/21/2015   Procedure: Right/Left Heart Cath and Coronary Angiography;  Surgeon: Sherren Mocha, MD;  Location: [place] CV LAB;  Service: Cardiovascular;  Laterality: N/A;  . COLONOSCOPY  08/2001   Colon cancer screening in 01/2012  . LAPAROSCOPIC CHOLECYSTECTOMY  1990  . left knee arthroscopic surgery Left 05/29/2018  . MITRAL VALVE REPAIR Right 02/01/2016   Procedure: MINIMALLY INVASIVE MITRAL VALVE REPAIR (MVR) with closure of PFO;  Surgeon: Rexene Alberts, MD;  Location: [place];  Service: Open Heart Surgery;  Laterality: Right;  . TEE WITHOUT CARDIOVERSION N/A 10/14/2015   Procedure: TRANSESOPHAGEAL ECHOCARDIOGRAM (TEE);  Surgeon: Thayer Headings, MD;  Location: [place];  Service: Cardiovascular;  Laterality: N/A;  . TEE WITHOUT CARDIOVERSION N/A 02/01/2016   Procedure: TRANSESOPHAGEAL ECHOCARDIOGRAM (TEE);  Surgeon: Rexene Alberts, MD;  Location: [place];  Service: Open Heart Surgery;  Laterality: N/A;  . TOTAL ABDOMINAL HYSTERECTOMY  1983   Cervical cancer 1983    Current Medications: Prior to Admission medications   Medication Sig Start Date End Date Taking? Authorizing Provider  acetaminophen (TYLENOL) 325 MG tablet Take 650 mg by mouth every 6 (six) hours as needed.    Historical Provider, MD  aspirin 81 MG tablet Take 81 mg by mouth daily.    Historical Provider, MD  calcium citrate-vitamin D (CITRACAL+D) 315-200 MG-UNIT tablet Take 1 tablet by mouth 3 (three) times daily. VITAMIN D DOSAGE IS 250MG  INSTEAD OF 200    Historical Provider, MD  diclofenac sodium (VOLTAREN) 1 %  GEL Apply 2 g topically as needed (back pain). 02/07/16   Donielle Liston Alba, PA-C  metoprolol tartrate (LOPRESSOR) 25 MG tablet Take 0.5 tablets (12.5 mg total) by mouth 2 (two) times daily. 02/21/16   Rexene Alberts, MD  traMADol (ULTRAM) 50 MG tablet Take 1 tablet (50 mg total) by mouth every 6 (six) hours as needed for moderate pain. 02/07/16   Donielle Liston Alba, PA-C  warfarin (COUMADIN) 2.5 MG tablet Take 1 to 1.5 tablets by mouth daily as directed by coumadin clinic 03/09/16   Thayer Headings, MD    Allergies:   Penicillins, Codeine, Doxycycline hyclate, and Ultram [tramadol]   Social History   Socioeconomic History  . Marital status: Married    Spouse name: Not on file  . Number of children: 1  . Years of education: Not on file  . Highest education level: Not on file  Occupational History  . Not on file  Tobacco Use  . Smoking status: Former Smoker    Years: 25.00    Types: Cigarettes    Quit date: 06/18/1985    Years since quitting: 34.6  . Smokeless tobacco: Never Used  Substance and Sexual Activity  . Alcohol use: Yes     Alcohol/week: 2.0 standard drinks    Types: 2 Standard drinks or equivalent per week    Comment: rare  . Drug use: No  . Sexual activity: Never  Other Topics Concern  . Not on file  Social History Narrative  . Not on file   Social Determinants of Health   Financial Resource Strain:   . Difficulty of Paying Living Expenses:   Food Insecurity:   . Worried About Charity fundraiser in the Last Year:   . Arboriculturist in the Last Year:   Transportation Needs:   . Film/video editor (Medical):   Marland Kitchen Lack of Transportation (Non-Medical):   Physical Activity:   . Days of Exercise per Week:   . Minutes of Exercise per Session:   Stress:   . Feeling of Stress :   Social Connections:   . Frequency of Communication with Friends and Family:   . Frequency of Social Gatherings with Friends and Family:   . Attends Religious Services:   . Active Member of Clubs or Organizations:   . Attends Archivist Meetings:   Marland Kitchen Marital Status:      Family History:  The patient's family history includes AAA (abdominal aortic aneurysm) in her father; Breast cancer in her mother; CAD in her father and mother; Heart disease in her father and mother; High Cholesterol in her brother; Hypertension in her brother, father, and mother; Skin cancer in her brother.   ROS:   Please see the history of present illness.    ROS All other systems reviewed and are negative.   PHYSICAL EXAM:    Physical Exam: Blood pressure (!) 92/54, pulse 82, height 5\' 4"  (1.626 m), weight 131 lb 12.8 oz (59.8 kg), SpO2 96 %.  GEN:  Well nourished, well developed in no acute distress HEENT: Normal NECK: No JVD; No carotid bruits LYMPHATICS: No lymphadenopathy CARDIAC: RRR , no murmur ,  RESPIRATORY:  Clear to auscultation without rales, wheezing or rhonchi  ABDOMEN: Soft, non-tender, non-distended MUSCULOSKELETAL:  No edema; No deformity  SKIN: Warm and dry NEUROLOGIC:  Alert and oriented x 3    Wt Readings  from Last 3 Encounters:  01/29/20 131 lb 12.8 oz (59.8 kg)  11/12/19  130 lb (59 kg)  10/20/19 132 lb (59.9 kg)      Studies/Labs Reviewed:   EKG:  .Marland Kitchen   Recent Labs: No results found for requested labs within last 8760 hours.   Lipid Panel No results found for: CHOL, TRIG, HDL, CHOLHDL, VLDL, LDLCALC, LDLDIRECT  Additional studies/ records that were reviewed today include:   As above  ASSESSMENT & PLAN:   1.   Weakness/fatigue: She has had some episodes of weakness and fatigue associated with walking.   She is also had clear-cut symptoms of orthostatic hypotension.  These are also associated with some significant palpitations.  A lot of her symptoms sound like she is volume depleted. She is not on any diuretic.  She drinks a fair amount of water but perhaps not enough.  I would have asked her to drink 64 ounces of fluid a day.  I advised her to add liquid IV or Nuun  tablets to her water or perhaps drink a V8 juice in the morning.  We will get an echocardiogram to evaluate her cardiac function.  Her valve sounds normal today. I would also like to get a GXT with pulse oximetry monitoring.  Labs today including basic metabolic profile, CBC, TSH.  2. Mitral valve prolapse with severe MR:   Status post mitral valve repair.  Her valve sounds great.  We will get an echocardiogram for follow-up.  Follow up in 4 to 6 weeks.    Signed, Mertie Moores, MD  01/29/2020 1:53 PM    [facility] [place], [address] [place], [place]  [address] Phone: [phone]; Fax: [phone]

## 2020-01-29 ENCOUNTER — Other Ambulatory Visit: Payer: Self-pay

## 2020-01-29 ENCOUNTER — Ambulatory Visit: Payer: Medicare PPO | Admitting: Cardiovascular Disease

## 2020-01-29 ENCOUNTER — Encounter: Payer: Self-pay | Admitting: Cardiovascular Disease

## 2020-01-29 VITALS — BP 92/54 | HR 82 | Ht 64.0 in | Wt 131.8 lb

## 2020-01-29 DIAGNOSIS — R002 Palpitations: Secondary | ICD-10-CM | POA: Insufficient documentation

## 2020-01-29 DIAGNOSIS — R531 Weakness: Secondary | ICD-10-CM | POA: Diagnosis not present

## 2020-01-29 DIAGNOSIS — Z9889 Other specified postprocedural states: Secondary | ICD-10-CM

## 2020-01-29 DIAGNOSIS — R5383 Other fatigue: Secondary | ICD-10-CM

## 2020-01-29 NOTE — Telephone Encounter (Signed)
Called and spoke to patient. She states that she was able to get re-tested yesterday at [facility]. She states that her results came back negative. She states that she has had a low grade fever this AM of 99.9 F. Instructed patient to still come in for appt today. She verbalized understanding. Dr. Acie Fredrickson made aware.

## 2020-01-29 NOTE — Patient Instructions (Addendum)
Medication Instructions:  Your provider recommends that you continue on your current medications as directed. Please refer to the Current Medication list given to you today.    COVID-19 prevention recommendations: 1) Vitamin D 5000 iu daily 2) Vitamin C 1000 mg twice daily 3) Zinc 50 mg daily 4) Melatonin 5 mg nightly  *If you need a refill on your cardiac medications before your next appointment, please call your pharmacy*  Lab Work: TODAY: CBC, BMET, TSH If you have labs (blood work) drawn today and your tests are completely normal, you will receive your results only by:  [place] (if you have MyChart) OR  A paper copy in the mail If you have any lab test that is abnormal or we need to change your treatment, we will call you to review the results.  Testing/Procedures: Your provider has requested that you have an exercise tolerance test. For further information please visit [email or web address]. Please also follow instruction sheet, as given.   Your physician has requested that you have an echocardiogram. Echocardiography is a painless test that uses sound waves to create images of your heart. It provides your doctor with information about the size and shape of your heart and how well your hearts chambers and valves are working. This procedure takes approximately one hour. There are no restrictions for this procedure.  Follow-Up: At [facility], you and your health needs are our priority.  As part of our continuing mission to provide you with exceptional heart care, we have created designated Provider Care Teams.  These Care Teams include your primary Cardiologist (physician) and Advanced Practice Providers (APPs -  Physician Assistants and Nurse Practitioners) who all work together to provide you with the care you need, when you need it. Your next appointment:   6 week(s) The format for your next appointment:   In Person Provider:   Richardson Dopp, PA-C   Other  information: Stay hydrated! Try Nuun tablets or Liquid IV. You can also drink a V8 daily.

## 2020-01-30 LAB — CBC WITH DIFFERENTIAL/PLATELET
Basophils Absolute: 0 10*3/uL (ref 0.0–0.2)
Basos: 1 %
EOS (ABSOLUTE): 0.6 10*3/uL — ABNORMAL HIGH (ref 0.0–0.4)
Eos: 10 %
Hematocrit: 33.7 % — ABNORMAL LOW (ref 34.0–46.6)
Hemoglobin: 11.9 g/dL (ref 11.1–15.9)
Immature Grans (Abs): 0 10*3/uL (ref 0.0–0.1)
Immature Granulocytes: 0 %
Lymphocytes Absolute: 1.4 10*3/uL (ref 0.7–3.1)
Lymphs: 22 %
MCH: 33.7 pg — ABNORMAL HIGH (ref 26.6–33.0)
MCHC: 35.3 g/dL (ref 31.5–35.7)
MCV: 96 fL (ref 79–97)
Monocytes Absolute: 0.6 10*3/uL (ref 0.1–0.9)
Monocytes: 10 %
Neutrophils Absolute: 3.5 10*3/uL (ref 1.4–7.0)
Neutrophils: 57 %
Platelets: 273 10*3/uL (ref 150–450)
RBC: 3.53 x10E6/uL — ABNORMAL LOW (ref 3.77–5.28)
RDW: 11.7 % (ref 11.7–15.4)
WBC: 6.2 10*3/uL (ref 3.4–10.8)

## 2020-01-30 LAB — TSH: TSH: 1.42 u[IU]/mL (ref 0.450–4.500)

## 2020-01-30 LAB — BASIC METABOLIC PANEL
BUN/Creatinine Ratio: 14 (ref 12–28)
BUN: 16 mg/dL (ref 8–27)
CO2: 24 mmol/L (ref 20–29)
Calcium: 9.6 mg/dL (ref 8.7–10.3)
Chloride: 96 mmol/L (ref 96–106)
Creatinine, Ser: 1.12 mg/dL — ABNORMAL HIGH (ref 0.57–1.00)
GFR calc Af Amer: 56 mL/min/{1.73_m2} — ABNORMAL LOW (ref >59)
GFR calc non Af Amer: 48 mL/min/{1.73_m2} — ABNORMAL LOW (ref >59)
Glucose: 105 mg/dL — ABNORMAL HIGH (ref 65–99)
Potassium: 4.3 mmol/L (ref 3.5–5.2)
Sodium: 132 mmol/L — ABNORMAL LOW (ref 134–144)

## 2020-02-02 ENCOUNTER — Telehealth: Payer: Self-pay | Admitting: Cardiovascular Disease

## 2020-02-02 NOTE — Telephone Encounter (Signed)
Patient is returning [place] call regarding lab results. Please call back.

## 2020-02-02 NOTE — Telephone Encounter (Signed)
Spoke with pt and made her aware of results and recommendations.  Answered pt's questions.  Pt appreciative for call.

## 2020-02-05 DIAGNOSIS — B349 Viral infection, unspecified: Secondary | ICD-10-CM | POA: Diagnosis not present

## 2020-02-12 ENCOUNTER — Other Ambulatory Visit (HOSPITAL_COMMUNITY)
Admission: RE | Admit: 2020-02-12 | Discharge: 2020-02-12 | Disposition: A | Discharge status: Home / Self Care | Payer: Medicare PPO | Source: Ambulatory Visit | Attending: Cardiovascular Disease | Admitting: Cardiovascular Disease

## 2020-02-12 DIAGNOSIS — Z20822 Contact with and (suspected) exposure to covid-19: Secondary | ICD-10-CM | POA: Insufficient documentation

## 2020-02-12 DIAGNOSIS — Z01812 Encounter for preprocedural laboratory examination: Secondary | ICD-10-CM | POA: Insufficient documentation

## 2020-02-12 LAB — SARS CORONAVIRUS 2 (TAT 6-24 HRS): SARS Coronavirus 2: NEGATIVE

## 2020-02-16 ENCOUNTER — Other Ambulatory Visit: Payer: Self-pay

## 2020-02-16 ENCOUNTER — Ambulatory Visit (HOSPITAL_COMMUNITY): Payer: Medicare PPO | Attending: Internal Medicine

## 2020-02-16 ENCOUNTER — Ambulatory Visit (INDEPENDENT_AMBULATORY_CARE_PROVIDER_SITE_OTHER): Payer: Medicare PPO

## 2020-02-16 ENCOUNTER — Other Ambulatory Visit: Payer: Self-pay | Admitting: Cardiovascular Disease

## 2020-02-16 DIAGNOSIS — R531 Weakness: Secondary | ICD-10-CM | POA: Diagnosis not present

## 2020-02-16 DIAGNOSIS — I059 Rheumatic mitral valve disease, unspecified: Secondary | ICD-10-CM | POA: Diagnosis not present

## 2020-02-16 DIAGNOSIS — R5383 Other fatigue: Secondary | ICD-10-CM | POA: Diagnosis not present

## 2020-02-16 DIAGNOSIS — R002 Palpitations: Secondary | ICD-10-CM | POA: Diagnosis not present

## 2020-02-16 LAB — ECHOCARDIOGRAM COMPLETE
Area-P 1/2: 2.45 cm2
P 1/2 time: 393 msec
S' Lateral: 2.2 cm

## 2020-02-16 LAB — EXERCISE TOLERANCE TEST
Estimated workload: 7.6 METS
Exercise duration (min): 6 min
Exercise duration (sec): 13 s
MPHR: 145 {beats}/min
Peak HR: 127 {beats}/min
Percent HR: 87 %
RPE: 15
Rest HR: 71 {beats}/min

## 2020-02-19 ENCOUNTER — Telehealth: Payer: Self-pay

## 2020-02-19 NOTE — Telephone Encounter (Signed)
The patient has been notified of the result and verbalized understanding.  All questions (if any) were answered. Wilma Flavin, RN 02/19/2020 9:25 AM

## 2020-02-19 NOTE — Telephone Encounter (Signed)
-----   Message from Thayer Headings, MD sent at 02/18/2020  8:30 AM EDT ----- Negative GXT.  No hypoxemia during treadmill .

## 2020-02-19 NOTE — Telephone Encounter (Signed)
-----   Message from Thayer Headings, MD sent at 02/18/2020  8:29 AM EDT ----- Echo is stable MV repair is unchanged and looks good

## 2020-02-26 DIAGNOSIS — N952 Postmenopausal atrophic vaginitis: Secondary | ICD-10-CM | POA: Diagnosis not present

## 2020-02-26 DIAGNOSIS — L309 Dermatitis, unspecified: Secondary | ICD-10-CM | POA: Diagnosis not present

## 2020-03-10 NOTE — Progress Notes (Signed)
Cardiology Office Note:    Date:  03/11/2020   ID:  Destiny Andersen, DOB 18-Sep-1945, MRN [id number]  PCP:  Lujean Amel, MD  [facility] Cardiologist:  Mertie Moores, MD   [facility] Electrophysiologist:  None   Referring MD: Lujean Amel, MD   Chief Complaint:  Follow-up (Fatigue)    Patient Profile:    Destiny Andersen is a 74 y.o. female with:   MVP w severe MR   Patent Foramen Ovale   S/p min inv MV repair, Patent Foramen Ovale closure in 8/17  Cath 2017: normal coronary arteries  Hx of seizures   Prior CV studies: ETT 02/16/20 Normal  No hypoxia  Echocardiogram 02/16/20 EF 60-65, no RWMA, normal RVSF, RVSP 26, trivial MR, mean MV 3.5 mmHg, trivial AI, no atrial level shunt  Carotid US 01/30/16 No sig ICA stenosis bilaterally  Cardiac catheterization 12/21/15 Normal coronary arteries   History of Present Illness:    Destiny Andersen was last seen by Dr. Acie Fredrickson in 8/21 with symptoms of palpitations, weakness and fatigue.  An ETT was normal with no evidence of hypoxia.  An echocardiogram demonstrated normal EF and normally functioning MV repair.  Labs: Creatinine 1.12, Na 132, K 4.3, Hgb 11.9.  She was somewhat hypotensive and was thought to be dehydrated and she was instructed to hydrate.  She returns for follow up.  She is here alone.  She is feeling much better.  Her PCP thought she probably had COVID-19.  She was at the beach before this and ate inside a restaurant.  She has been hydrating well.  She has not had chest pain, shortness of breath, syncope, near syncope, leg swelling.       Past Medical History:  Diagnosis Date  . Cancer [facility]) 1983   Cervical cancer.  Skin Cancer- leg  . Chronic headaches    "? Migraines"- does not have then any more  . Constipation   . Elevated C-reactive protein (CRP)   . Family history of breast cancer    Mother  . Fatigue   . Heart murmur    ECHO 02/10/13 EF = 60-65%, moderate posterior mitral valve prolapse w/possible  flail segment, severe MR, mitral regurgitant jet is anteriorly directed.  Marland Kitchen History of blood transfusion    pre-eclampsia   . Hyperpotassemia    Migrated  . Medicare annual wellness visit, subsequent   . Mitral valve prolapse   . MR (mitral regurgitation)    ECHO 02/10/13 EF = 60-65%, moderate posterior mitral valve prolapse w/possible flail segment, severe MR, mitral regurgitant jet is anteriorly directed.  . Risk for falls   . Routine general medical examination at a health care facility   . S/P minimally invasive mitral valve repair 02/01/2016   Complex valvuloplasty including triangular resection of flail posterior leaflet, artificial Gore-tex neochord placement x10 and 28 mm Sorin Memo 3D ring annuloplasty via right mini thoracotomy approach  . Seizures ([place])    with pre- echa  . Shortness of breath dyspnea    with exertion    Current Medications: Current Meds  Medication Sig  . aspirin 81 MG tablet Take 81 mg by mouth daily.  . calcium citrate-vitamin D (CITRACAL+D) 315-200 MG-UNIT tablet Take 1 tablet by mouth 2 (two) times daily. VITAMIN D DOSAGE IS 250MG  INSTEAD OF 200  . clindamycin (CLEOCIN) 300 MG capsule TAKE 2 CAPSULES BY MOUTH AS DIRECTED 1 HOUR PRIOR TO DENTAL WORK  . clotrimazole-betamethasone (LOTRISONE) cream Apply 1 application topically as  needed.  . metoprolol tartrate (LOPRESSOR) 25 MG tablet TAKE 1/2 TABLET BY MOUTH EVERY MORNING, AND 1 TABLET EVERY EVENING     Allergies:   Penicillins, Codeine, Doxycycline hyclate, and Ultram [tramadol]   Social History   Tobacco Use  . Smoking status: Former Smoker    Years: 25.00    Types: Cigarettes    Quit date: 06/18/1985    Years since quitting: 34.7  . Smokeless tobacco: Never Used  Substance Use Topics  . Alcohol use: Yes    Alcohol/week: 2.0 standard drinks    Types: 2 Standard drinks or equivalent per week    Comment: rare  . Drug use: No     Family Hx: The patient's family history includes AAA  (abdominal aortic aneurysm) in her father; Breast cancer in her mother; CAD in her father and mother; Heart disease in her father and mother; High Cholesterol in her brother; Hypertension in her brother, father, and mother; Skin cancer in her brother.  ROS   EKGs/Labs/Other Test Reviewed:    EKG:  EKG is not ordered today.  The ekg ordered today demonstrates n/a  Recent Labs: 01/29/2020: BUN 16; Creatinine, Ser 1.12; Hemoglobin 11.9; Platelets 273; Potassium 4.3; Sodium 132; TSH 1.420   Recent Lipid Panel No results found for: CHOL, TRIG, HDL, CHOLHDL, LDLCALC, LDLDIRECT  Physical Exam:    VS:  BP (!) 100/50   Pulse 71   Ht 5\' 4"  (1.626 m)   Wt 130 lb (59 kg)   SpO2 97%   BMI 22.31 kg/m     Wt Readings from Last 3 Encounters:  03/11/20 130 lb (59 kg)  01/29/20 131 lb 12.8 oz (59.8 kg)  11/12/19 130 lb (59 kg)     Constitutional:      Appearance: Healthy appearance. Not in distress.  Pulmonary:     Effort: Pulmonary effort is normal.     Breath sounds: No wheezing. No rales.  Cardiovascular:     Normal rate. Regular rhythm. Normal S1. Normal S2.     Murmurs: There is no murmur.  Edema:    Peripheral edema absent.  Abdominal:     Palpations: Abdomen is soft.  Musculoskeletal:     Cervical back: Neck supple. Skin:    General: Skin is warm and dry.  Neurological:     Mental Status: Alert and oriented to person, place and time.     Cranial Nerves: Cranial nerves are intact.        ASSESSMENT & PLAN:    1. Hyponatremia She recently presented with fatigue, weakness and palpitations. She had a stress test that demonstrated no ischemic changes and normal oxygen levels. Her echocardiogram demonstrated normally functioning mitral valve repair and normal ejection fraction. She had a mildly elevated creatinine and a slightly decreased sodium. Her pressure was in the 90s. She was likely dehydrated. Her PCP feels that she had COVID-19. She has been hydrating well and feels much  better. I will repeat a BMET today to ensure her creatinine and sodium are back to normal levels. Follow-up with Dr. Acie Fredrickson in 1 year for ongoing management of valvular heart disease status post mitral valve repair.   Health Maint She has received both doses of Pfizer COVID-19 vaccine   Dispo:  Return in about 1 year (around 03/11/2021) for Routine Follow Up, w/ Dr. Acie Fredrickson, in person.   Medication Adjustments/Labs and Tests Ordered: Current medicines are reviewed at length with the patient today.  Concerns regarding medicines are outlined above.  Tests Ordered: Orders Placed This Encounter  Procedures  . Basic metabolic panel   Medication Changes: No orders of the defined types were placed in this encounter.   Signed, Richardson Dopp, PA-C  03/11/2020 11:02 AM    [facility] [place], [place], [place]  [address] Phone: [phone]; Fax: [phone]

## 2020-03-11 ENCOUNTER — Other Ambulatory Visit: Payer: Self-pay

## 2020-03-11 ENCOUNTER — Encounter: Payer: Self-pay | Admitting: Physician Assistant

## 2020-03-11 ENCOUNTER — Ambulatory Visit: Payer: Medicare PPO | Admitting: Physician Assistant

## 2020-03-11 VITALS — BP 100/50 | HR 71 | Ht 64.0 in | Wt 130.0 lb

## 2020-03-11 DIAGNOSIS — E871 Hypo-osmolality and hyponatremia: Secondary | ICD-10-CM | POA: Diagnosis not present

## 2020-03-11 LAB — BASIC METABOLIC PANEL
BUN/Creatinine Ratio: 16 (ref 12–28)
BUN: 17 mg/dL (ref 8–27)
CO2: 26 mmol/L (ref 20–29)
Calcium: 9.9 mg/dL (ref 8.7–10.3)
Chloride: 102 mmol/L (ref 96–106)
Creatinine, Ser: 1.05 mg/dL — ABNORMAL HIGH (ref 0.57–1.00)
GFR calc Af Amer: 60 mL/min/{1.73_m2} (ref >59)
GFR calc non Af Amer: 52 mL/min/{1.73_m2} — ABNORMAL LOW (ref >59)
Glucose: 77 mg/dL (ref 65–99)
Potassium: 4.4 mmol/L (ref 3.5–5.2)
Sodium: 138 mmol/L (ref 134–144)

## 2020-03-11 NOTE — Patient Instructions (Signed)
Medication Instructions:  Your physician recommends that you continue on your current medications as directed. Please refer to the Current Medication list given to you today.  *If you need a refill on your cardiac medications before your next appointment, please call your pharmacy*  Lab Work: You will have labs drawn today: BMET  Testing/Procedures: None ordered today  Follow-Up: At [facility], you and your health needs are our priority.  As part of our continuing mission to provide you with exceptional heart care, we have created designated Provider Care Teams.  These Care Teams include your primary Cardiologist (physician) and Advanced Practice Providers (APPs -  Physician Assistants and Nurse Practitioners) who all work together to provide you with the care you need, when you need it.  Your next appointment:   12 month(s)  The format for your next appointment:   In Person  Provider:   Mertie Moores, MD

## 2020-05-18 DIAGNOSIS — Z0001 Encounter for general adult medical examination with abnormal findings: Secondary | ICD-10-CM | POA: Diagnosis not present

## 2020-05-18 DIAGNOSIS — Z8541 Personal history of malignant neoplasm of cervix uteri: Secondary | ICD-10-CM | POA: Diagnosis not present

## 2020-05-18 DIAGNOSIS — N1831 Chronic kidney disease, stage 3a: Secondary | ICD-10-CM | POA: Diagnosis not present

## 2020-05-18 DIAGNOSIS — I340 Nonrheumatic mitral (valve) insufficiency: Secondary | ICD-10-CM | POA: Diagnosis not present

## 2020-05-18 DIAGNOSIS — E7800 Pure hypercholesterolemia, unspecified: Secondary | ICD-10-CM | POA: Diagnosis not present

## 2020-05-18 DIAGNOSIS — Z79899 Other long term (current) drug therapy: Secondary | ICD-10-CM | POA: Diagnosis not present

## 2020-06-09 ENCOUNTER — Other Ambulatory Visit: Payer: Self-pay | Admitting: Family Medicine

## 2020-06-09 DIAGNOSIS — Z1231 Encounter for screening mammogram for malignant neoplasm of breast: Secondary | ICD-10-CM

## 2020-06-16 DIAGNOSIS — D2262 Melanocytic nevi of left upper limb, including shoulder: Secondary | ICD-10-CM | POA: Diagnosis not present

## 2020-06-16 DIAGNOSIS — Z85828 Personal history of other malignant neoplasm of skin: Secondary | ICD-10-CM | POA: Diagnosis not present

## 2020-06-16 DIAGNOSIS — L821 Other seborrheic keratosis: Secondary | ICD-10-CM | POA: Diagnosis not present

## 2020-06-16 DIAGNOSIS — D2261 Melanocytic nevi of right upper limb, including shoulder: Secondary | ICD-10-CM | POA: Diagnosis not present

## 2020-06-16 DIAGNOSIS — D225 Melanocytic nevi of trunk: Secondary | ICD-10-CM | POA: Diagnosis not present

## 2020-06-18 DIAGNOSIS — L111 Transient acantholytic dermatosis [Grover]: Secondary | ICD-10-CM

## 2020-06-18 HISTORY — DX: Transient acantholytic dermatosis (grover): L11.1

## 2020-08-08 ENCOUNTER — Ambulatory Visit
Admission: RE | Admit: 2020-08-08 | Discharge: 2020-08-08 | Disposition: A | Discharge status: Home / Self Care | Payer: Medicare PPO | Source: Ambulatory Visit | Attending: Family Medicine | Admitting: Family Medicine

## 2020-08-08 ENCOUNTER — Other Ambulatory Visit: Payer: Self-pay

## 2020-08-08 DIAGNOSIS — Z1231 Encounter for screening mammogram for malignant neoplasm of breast: Secondary | ICD-10-CM

## 2020-08-09 DIAGNOSIS — L299 Pruritus, unspecified: Secondary | ICD-10-CM | POA: Diagnosis not present

## 2020-08-22 DIAGNOSIS — Z85828 Personal history of other malignant neoplasm of skin: Secondary | ICD-10-CM | POA: Diagnosis not present

## 2020-08-22 DIAGNOSIS — L111 Transient acantholytic dermatosis [Grover]: Secondary | ICD-10-CM | POA: Diagnosis not present

## 2020-08-25 ENCOUNTER — Other Ambulatory Visit: Payer: Self-pay

## 2020-08-25 ENCOUNTER — Ambulatory Visit
Admission: RE | Admit: 2020-08-25 | Discharge: 2020-08-25 | Disposition: A | Discharge status: Home / Self Care | Payer: Medicare PPO | Source: Ambulatory Visit | Attending: Family Medicine | Admitting: Family Medicine

## 2020-08-25 DIAGNOSIS — Z1231 Encounter for screening mammogram for malignant neoplasm of breast: Secondary | ICD-10-CM | POA: Diagnosis not present

## 2020-08-31 ENCOUNTER — Other Ambulatory Visit: Payer: Self-pay | Admitting: Family Medicine

## 2020-08-31 DIAGNOSIS — D3132 Benign neoplasm of left choroid: Secondary | ICD-10-CM | POA: Diagnosis not present

## 2020-08-31 DIAGNOSIS — R928 Other abnormal and inconclusive findings on diagnostic imaging of breast: Secondary | ICD-10-CM

## 2020-08-31 DIAGNOSIS — H524 Presbyopia: Secondary | ICD-10-CM | POA: Diagnosis not present

## 2020-08-31 DIAGNOSIS — Z961 Presence of intraocular lens: Secondary | ICD-10-CM | POA: Diagnosis not present

## 2020-09-19 ENCOUNTER — Other Ambulatory Visit: Payer: Self-pay

## 2020-09-19 ENCOUNTER — Other Ambulatory Visit: Payer: Self-pay | Admitting: Family Medicine

## 2020-09-19 ENCOUNTER — Ambulatory Visit
Admission: RE | Admit: 2020-09-19 | Discharge: 2020-09-19 | Disposition: A | Discharge status: Home / Self Care | Payer: Medicare PPO | Source: Ambulatory Visit | Attending: Family Medicine | Admitting: Family Medicine

## 2020-09-19 DIAGNOSIS — R928 Other abnormal and inconclusive findings on diagnostic imaging of breast: Secondary | ICD-10-CM | POA: Diagnosis not present

## 2020-09-19 DIAGNOSIS — R922 Inconclusive mammogram: Secondary | ICD-10-CM | POA: Diagnosis not present

## 2020-09-20 ENCOUNTER — Other Ambulatory Visit: Payer: Medicare PPO

## 2020-09-20 ENCOUNTER — Ambulatory Visit
Admission: RE | Admit: 2020-09-20 | Discharge: 2020-09-20 | Disposition: A | Discharge status: Home / Self Care | Payer: Medicare PPO | Source: Ambulatory Visit | Attending: Family Medicine | Admitting: Family Medicine

## 2020-09-20 DIAGNOSIS — N641 Fat necrosis of breast: Secondary | ICD-10-CM | POA: Diagnosis not present

## 2020-09-20 DIAGNOSIS — R928 Other abnormal and inconclusive findings on diagnostic imaging of breast: Secondary | ICD-10-CM

## 2020-09-20 DIAGNOSIS — N6322 Unspecified lump in the left breast, upper inner quadrant: Secondary | ICD-10-CM | POA: Diagnosis not present

## 2020-09-20 DIAGNOSIS — C50919 Malignant neoplasm of unspecified site of unspecified female breast: Secondary | ICD-10-CM

## 2020-09-20 HISTORY — DX: Malignant neoplasm of unspecified site of unspecified female breast: C50.919

## 2020-09-28 ENCOUNTER — Encounter: Payer: Self-pay | Admitting: Adult Health

## 2020-09-28 DIAGNOSIS — C50212 Malignant neoplasm of upper-inner quadrant of left female breast: Secondary | ICD-10-CM | POA: Insufficient documentation

## 2020-10-05 ENCOUNTER — Other Ambulatory Visit: Payer: Self-pay | Admitting: Surgery

## 2020-10-05 DIAGNOSIS — Z853 Personal history of malignant neoplasm of breast: Secondary | ICD-10-CM

## 2020-10-05 DIAGNOSIS — C50912 Malignant neoplasm of unspecified site of left female breast: Secondary | ICD-10-CM | POA: Diagnosis not present

## 2020-10-07 ENCOUNTER — Telehealth: Payer: Self-pay | Admitting: Hematology and Oncology

## 2020-10-07 NOTE — Telephone Encounter (Signed)
Received a new pt referral from Dr. Ninfa Linden for a new dx of breast cancer. Destiny Andersen has been cld and scheduled to see Dr. Lindi Adie on 5/2 at 1pm. Pt aware to arrive 20 minutes early.

## 2020-10-12 ENCOUNTER — Other Ambulatory Visit: Payer: Self-pay | Admitting: Surgery

## 2020-10-12 DIAGNOSIS — Z853 Personal history of malignant neoplasm of breast: Secondary | ICD-10-CM

## 2020-10-16 NOTE — Progress Notes (Signed)
[facility] CONSULT NOTE  Patient Care Team: Lujean Amel, MD as PCP - General (Family Medicine) [name], Wonda Cheng, MD as PCP - Cardiology (Cardiology)  CHIEF COMPLAINTS/PURPOSE OF CONSULTATION:  Newly diagnosed breast cancer  HISTORY OF PRESENTING ILLNESS:  Destiny Andersen 75 y.o. female is here because of recent diagnosis of invasive ductal carcinoma of the left breast. Screening mammogram on 08/25/20 showed a possible left breast mass. Diagnostic mammogram and [place] on 09/19/20 showed a 1.3cm left breast mass at the 11 o'clock position and no left axillary adenopathy. Biopsy on 09/20/20 showed invasive ductal carcinoma, grade 2, HER-2 equivocal by IHC, negative by FISH (ratio 1.26), ER/PR+ 90%, KI67 15%. She presents to the clinic today for initial evaluation and discussion of treatment options.   I reviewed her records extensively and collaborated the history with the patient.  SUMMARY OF ONCOLOGIC HISTORY: Oncology History  Malignant neoplasm of upper-inner quadrant of left breast in female, estrogen receptor positive ([place])  09/20/2020 Cancer Staging   Staging form: Breast, AJCC 8th Edition - Clinical stage from 09/20/2020: Stage IA (cT1c, cN0, cM0, G2, ER+, PR+, HER2-) - Signed by Gardenia Phlegm, NP on 09/28/2020 Stage prefix: Initial diagnosis Histologic grading system: 3 grade system   09/28/2020 Initial Diagnosis   Screening mammogram showed a possible left breast mass. Diagnostic mammogram and US showed a 1.3cm left breast mass at the 11 o'clock position and no left axillary adenopathy. Biopsy showed invasive ductal carcinoma, grade 2, HER-2 equivocal by IHC, negative by FISH (ratio 1.26), ER/PR+ 90%, KI67 15%.      MEDICAL HISTORY:  Past Medical History:  Diagnosis Date  . Cancer [facility]) 1983   Cervical cancer.  Skin Cancer- leg  . Chronic headaches    "? Migraines"- does not have then any more  . Constipation   . Elevated C-reactive protein (CRP)   .  Family history of breast cancer    Mother  . Fatigue   . Heart murmur    ECHO 02/10/13 EF = 60-65%, moderate posterior mitral valve prolapse w/possible flail segment, severe MR, mitral regurgitant jet is anteriorly directed.  Marland Kitchen History of blood transfusion    pre-eclampsia   . Hyperpotassemia    Migrated  . Medicare annual wellness visit, subsequent   . Mitral valve prolapse   . MR (mitral regurgitation)    ECHO 02/10/13 EF = 60-65%, moderate posterior mitral valve prolapse w/possible flail segment, severe MR, mitral regurgitant jet is anteriorly directed.  . Risk for falls   . Routine general medical examination at a health care facility   . S/P minimally invasive mitral valve repair 02/01/2016   Complex valvuloplasty including triangular resection of flail posterior leaflet, artificial Gore-tex neochord placement x10 and 28 mm Sorin Memo 3D ring annuloplasty via right mini thoracotomy approach  . Seizures ([place])    with pre- echa  . Shortness of breath dyspnea    with exertion    SURGICAL HISTORY: Past Surgical History:  Procedure Laterality Date  . BREAST BIOPSY Right 1998   benign cyst  . CARDIAC CATHETERIZATION N/A 12/21/2015   Procedure: Right/Left Heart Cath and Coronary Angiography;  Surgeon: Sherren Mocha, MD;  Location: [facility] CV LAB;  Service: Cardiovascular;  Laterality: N/A;  . COLONOSCOPY  08/2001   Colon cancer screening in 01/2012  . LAPAROSCOPIC CHOLECYSTECTOMY  1990  . left knee arthroscopic surgery Left 05/29/2018  . MITRAL VALVE REPAIR Right 02/01/2016   Procedure: MINIMALLY INVASIVE MITRAL VALVE REPAIR (MVR) with  closure of PFO;  Surgeon: Rexene Alberts, MD;  Location: [facility];  Service: Open Heart Surgery;  Laterality: Right;  . TEE WITHOUT CARDIOVERSION N/A 10/14/2015   Procedure: TRANSESOPHAGEAL ECHOCARDIOGRAM (TEE);  Surgeon: Thayer Headings, MD;  Location: [facility];  Service: Cardiovascular;  Laterality: N/A;  . TEE WITHOUT CARDIOVERSION N/A 02/01/2016    Procedure: TRANSESOPHAGEAL ECHOCARDIOGRAM (TEE);  Surgeon: Rexene Alberts, MD;  Location: [facility];  Service: Open Heart Surgery;  Laterality: N/A;  . TOTAL ABDOMINAL HYSTERECTOMY  1983   Cervical cancer 1983    SOCIAL HISTORY: Social History   Socioeconomic History  . Marital status: Married    Spouse name: Not on file  . Number of children: 1  . Years of education: Not on file  . Highest education level: Not on file  Occupational History  . Not on file  Tobacco Use  . Smoking status: Former Smoker    Years: 25.00    Types: Cigarettes    Quit date: 06/18/1985    Years since quitting: 35.3  . Smokeless tobacco: Never Used  Substance and Sexual Activity  . Alcohol use: Yes    Alcohol/week: 2.0 standard drinks    Types: 2 Standard drinks or equivalent per week    Comment: rare  . Drug use: No  . Sexual activity: Never  Other Topics Concern  . Not on file  Social History Narrative  . Not on file   Social Determinants of Health   Financial Resource Strain: Not on file  Food Insecurity: Not on file  Transportation Needs: Not on file  Physical Activity: Not on file  Stress: Not on file  Social Connections: Not on file  Intimate Partner Violence: Not on file    FAMILY HISTORY: Family History  Problem Relation Age of Onset  . Hypertension Mother   . Heart disease Mother   . CAD Mother   . Breast cancer Mother 38  . AAA (abdominal aortic aneurysm) Father   . CAD Father   . Hypertension Father   . Heart disease Father   . High Cholesterol Brother   . Hypertension Brother   . Skin cancer Brother     ALLERGIES:  is allergic to penicillins, codeine, doxycycline hyclate, and ultram [tramadol].  MEDICATIONS:  Current Outpatient Medications  Medication Sig Dispense Refill  . Biotin 1 MG CAPS Take 1 capsule by mouth daily. 30 capsule   . aspirin 81 MG tablet Take 81 mg by mouth daily.    . calcium citrate-vitamin D (CITRACAL+D) 315-200 MG-UNIT tablet Take 1 tablet  by mouth 2 (two) times daily. VITAMIN D DOSAGE IS 250MG INSTEAD OF 200    . clindamycin (CLEOCIN) 300 MG capsule TAKE 2 CAPSULES BY MOUTH AS DIRECTED 1 HOUR PRIOR TO DENTAL WORK 2 capsule 0  . clotrimazole-betamethasone (LOTRISONE) cream Apply 1 application topically as needed.    . metoprolol tartrate (LOPRESSOR) 25 MG tablet TAKE 1/2 TABLET BY MOUTH EVERY MORNING, AND 1 TABLET EVERY EVENING 135 tablet 3   No current facility-administered medications for this visit.    REVIEW OF SYSTEMS:     All other systems were reviewed with the patient and are negative.  PHYSICAL EXAMINATION: ECOG PERFORMANCE STATUS: 1 - Symptomatic but completely ambulatory  Vitals:   10/17/20 1257  BP: 133/63  Pulse: 75  Resp: 17  Temp: 97.6 F (36.4 C)  SpO2: 100%   Filed Weights   10/17/20 1257  Weight: 134 lb 14.4 oz (61.2 kg)  LABORATORY DATA:  I have reviewed the data as listed Lab Results  Component Value Date   WBC 6.2 01/29/2020   HGB 11.9 01/29/2020   HCT 33.7 (L) 01/29/2020   MCV 96 01/29/2020   PLT 273 01/29/2020   Lab Results  Component Value Date   NA 138 03/11/2020   K 4.4 03/11/2020   CL 102 03/11/2020   CO2 26 03/11/2020    RADIOGRAPHIC STUDIES: I have personally reviewed the radiological reports and agreed with the findings in the report.  ASSESSMENT AND PLAN:  Malignant neoplasm of upper-inner quadrant of left breast in female, estrogen receptor positive ([place]) 09/28/2020:Screening mammogram showed a possible left breast mass. Diagnostic mammogram and US showed a 1.3cm left breast mass at the 11 o'clock position and no left axillary adenopathy. Biopsy showed invasive ductal carcinoma, grade 2, HER-2 equivocal by IHC, negative by FISH (ratio 1.26), ER/PR+ 90%, KI67 15%.  Pathology and radiology counseling:Discussed with the patient, the details of pathology including the type of breast cancer,the clinical staging, the significance of ER, PR and HER-2/neu receptors and  the implications for treatment. After reviewing the pathology in detail, we proceeded to discuss the different treatment options between surgery, radiation, chemotherapy, antiestrogen therapies.  Recommendations: 1. Breast conserving surgery followed by 2. Oncotype DX testing to determine if chemotherapy would be of any benefit followed by 3. Adjuvant radiation therapy followed by (patient has a skin condition which she will discuss with radiation oncology regarding pros and cons of radiation) 4. Adjuvant antiestrogen therapy  Oncotype counseling: I discussed Oncotype DX test. I explained to the patient that this is a 21 gene panel to evaluate patient tumors DNA to calculate recurrence score. This would help determine whether patient has high risk or low risk breast cancer. She understands that if her tumor was found to be high risk, she would benefit from systemic chemotherapy. If low risk, no need of chemotherapy.  Return to clinic after surgery to discuss final pathology report and then determine if Oncotype DX testing will need to be sent.     All questions were answered. The patient knows to call the clinic with any problems, questions or concerns.   Rulon Eisenmenger, MD, MPH 10/17/2020    I, Molly Dorshimer, am acting as scribe for Nicholas Lose, MD.  I have reviewed the above documentation for accuracy and completeness, and I agree with the above.

## 2020-10-17 ENCOUNTER — Other Ambulatory Visit: Payer: Self-pay

## 2020-10-17 ENCOUNTER — Inpatient Hospital Stay: Payer: Medicare PPO | Attending: Hematology and Oncology | Admitting: Hematology and Oncology

## 2020-10-17 DIAGNOSIS — C50212 Malignant neoplasm of upper-inner quadrant of left female breast: Secondary | ICD-10-CM | POA: Diagnosis not present

## 2020-10-17 DIAGNOSIS — Z170 Estrogen receptor positive status [ER+]: Secondary | ICD-10-CM | POA: Insufficient documentation

## 2020-10-17 DIAGNOSIS — Z85828 Personal history of other malignant neoplasm of skin: Secondary | ICD-10-CM | POA: Diagnosis not present

## 2020-10-17 DIAGNOSIS — Z8541 Personal history of malignant neoplasm of cervix uteri: Secondary | ICD-10-CM | POA: Diagnosis not present

## 2020-10-17 DIAGNOSIS — Z87891 Personal history of nicotine dependence: Secondary | ICD-10-CM | POA: Insufficient documentation

## 2020-10-17 DIAGNOSIS — Z7982 Long term (current) use of aspirin: Secondary | ICD-10-CM | POA: Diagnosis not present

## 2020-10-17 DIAGNOSIS — Z79899 Other long term (current) drug therapy: Secondary | ICD-10-CM | POA: Insufficient documentation

## 2020-10-17 MED ORDER — BIOTIN 1 MG PO CAPS: 1.0000 | ORAL_CAPSULE | Freq: Every day | ORAL | Status: DC

## 2020-10-17 NOTE — Assessment & Plan Note (Addendum)
09/28/2020:Screening mammogram showed a possible left breast mass. Diagnostic mammogram and US showed a 1.3cm left breast mass at the 11 o'clock position and no left axillary adenopathy. Biopsy showed invasive ductal carcinoma, grade 2, HER-2 equivocal by IHC, negative by FISH (ratio 1.26), ER/PR+ 90%, KI67 15%.  Pathology and radiology counseling:Discussed with the patient, the details of pathology including the type of breast cancer,the clinical staging, the significance of ER, PR and HER-2/neu receptors and the implications for treatment. After reviewing the pathology in detail, we proceeded to discuss the different treatment options between surgery, radiation, chemotherapy, antiestrogen therapies.  Recommendations: 1. Breast conserving surgery followed by 2. Oncotype DX testing to determine if chemotherapy would be of any benefit followed by 3. Adjuvant radiation therapy followed by (patient has a skin condition which she will discuss with radiation oncology regarding pros and cons of radiation) 4. Adjuvant antiestrogen therapy  Oncotype counseling: I discussed Oncotype DX test. I explained to the patient that this is a 21 gene panel to evaluate patient tumors DNA to calculate recurrence score. This would help determine whether patient has high risk or low risk breast cancer. She understands that if her tumor was found to be high risk, she would benefit from systemic chemotherapy. If low risk, no need of chemotherapy.  Return to clinic after surgery to discuss final pathology report and then determine if Oncotype DX testing will need to be sent.

## 2020-10-18 ENCOUNTER — Telehealth: Payer: Self-pay | Admitting: *Deleted

## 2020-10-18 ENCOUNTER — Other Ambulatory Visit: Payer: Self-pay | Admitting: *Deleted

## 2020-10-18 ENCOUNTER — Encounter: Payer: Self-pay | Admitting: *Deleted

## 2020-10-18 DIAGNOSIS — C50212 Malignant neoplasm of upper-inner quadrant of left female breast: Secondary | ICD-10-CM

## 2020-10-18 DIAGNOSIS — Z170 Estrogen receptor positive status [ER+]: Secondary | ICD-10-CM

## 2020-10-18 NOTE — Telephone Encounter (Signed)
Left vm providing navigation resources and contact information for questions or needs.

## 2020-10-19 NOTE — Progress Notes (Signed)
New Breast Cancer Diagnosis: Left Breast  Did patient present with symptoms (if so, please note symptoms) or screening mammography?:Screening Mass    Location and Extent of disease :left breast. Located at 11 o'clock position, measured  1.3 cm in greatest dimension. Adenopathy no.  Histology per Pathology Report: grade 2, Invasive Ductal Carcinoma  09/20/2020  Receptor Status: ER(positive), PR (positive), Her2-neu (negative), Ki-(15%)  Surgeon and surgical plan, if any: Dr. Ninfa Linden -Left Breast Lumpectomy with radioactive seed localization 10/27/2020  Medical oncologist, treatment if any:   Dr. Lindi Adie 10/17/2020  Recommendations: 1. Breast conserving surgery followed by 2. Oncotype DX testing to determine if chemotherapy would be of any benefit followed by 3. Adjuvant radiation therapy followed by (patient has a skin condition which she will discuss with radiation oncology regarding pros and cons of radiation) 4. Adjuvant antiestrogen therapy  Family History of Breast/Ovarian/Prostate Cancer: Mother Breast cancer  Lymphedema issues, if any:  No  Pain issues, if any: no  SAFETY ISSUES: Prior radiation? No Pacemaker/ICD? No Possible current pregnancy? Postmenopausal Is the patient on methotrexate? No  Current Complaints / other details:   -Has Grover's Disease- using benadryl.  Seeing Dr. Wilhemina Bonito with Dermatology.

## 2020-10-20 ENCOUNTER — Ambulatory Visit
Admission: RE | Admit: 2020-10-20 | Discharge: 2020-10-20 | Disposition: A | Discharge status: Home / Self Care | Payer: Medicare PPO | Source: Ambulatory Visit | Attending: Radiation Oncology | Admitting: Radiation Oncology

## 2020-10-20 ENCOUNTER — Encounter (HOSPITAL_BASED_OUTPATIENT_CLINIC_OR_DEPARTMENT_OTHER): Payer: Self-pay | Admitting: Surgery

## 2020-10-20 ENCOUNTER — Other Ambulatory Visit: Payer: Self-pay

## 2020-10-20 ENCOUNTER — Encounter: Payer: Self-pay | Admitting: Radiation Oncology

## 2020-10-20 VITALS — BP 117/76 | HR 75 | Temp 97.4°F | Resp 18 | Ht 64.0 in | Wt 133.1 lb

## 2020-10-20 DIAGNOSIS — R011 Cardiac murmur, unspecified: Secondary | ICD-10-CM | POA: Insufficient documentation

## 2020-10-20 DIAGNOSIS — L111 Transient acantholytic dermatosis [Grover]: Secondary | ICD-10-CM | POA: Diagnosis not present

## 2020-10-20 DIAGNOSIS — Z170 Estrogen receptor positive status [ER+]: Secondary | ICD-10-CM

## 2020-10-20 DIAGNOSIS — I341 Nonrheumatic mitral (valve) prolapse: Secondary | ICD-10-CM | POA: Diagnosis not present

## 2020-10-20 DIAGNOSIS — R7982 Elevated C-reactive protein (CRP): Secondary | ICD-10-CM | POA: Insufficient documentation

## 2020-10-20 DIAGNOSIS — Z87891 Personal history of nicotine dependence: Secondary | ICD-10-CM | POA: Insufficient documentation

## 2020-10-20 DIAGNOSIS — Z7982 Long term (current) use of aspirin: Secondary | ICD-10-CM | POA: Insufficient documentation

## 2020-10-20 DIAGNOSIS — Z8541 Personal history of malignant neoplasm of cervix uteri: Secondary | ICD-10-CM | POA: Diagnosis not present

## 2020-10-20 DIAGNOSIS — Z85828 Personal history of other malignant neoplasm of skin: Secondary | ICD-10-CM | POA: Diagnosis not present

## 2020-10-20 DIAGNOSIS — C50212 Malignant neoplasm of upper-inner quadrant of left female breast: Secondary | ICD-10-CM | POA: Diagnosis not present

## 2020-10-20 DIAGNOSIS — Z803 Family history of malignant neoplasm of breast: Secondary | ICD-10-CM | POA: Insufficient documentation

## 2020-10-20 DIAGNOSIS — K5900 Constipation, unspecified: Secondary | ICD-10-CM | POA: Diagnosis not present

## 2020-10-20 NOTE — Progress Notes (Signed)
Radiation Oncology         [phone] ________________________________  Name: Destiny Andersen        MRN: [id number]  Date of Service: 10/20/2020 DOB: Mar 14, 1946  KZ:SWFUXNA, [place]as, MD  Nicholas Lose, MD     REFERRING PHYSICIAN: Nicholas Lose, MD   DIAGNOSIS: The encounter diagnosis was Malignant neoplasm of upper-inner quadrant of left breast in female, estrogen receptor positive ([place]).   HISTORY OF PRESENT ILLNESS: Destiny Andersen is a 75 y.o. female seen in the multidisciplinary breast clinic for a new diagnosis of left breast cancer. The patient was noted to have a screening detected mass in the left breast, further diagnostic work-up revealed a 1.3 cm mass in the 11 o'clock position.  She did not have any adenopathy. She underwent a biopsy on 09/20/2020 which revealed a grade 2 invasive ductal carcinoma that was ER/PR positive, HER2 was negative by FISH, and her Ki-67 was 15%.  She has met with Dr. Ninfa Linden and plans to undergo left lumpectomy which is scheduled for 10/27/2020.  She is also met with Dr. Lindi Adie who plans Oncotype DX score to determine the role for systemic therapy.  She is seen today to assess adjuvant radiation.  Of note she has a chronic skin condition called Grover's disease that Dr. Ronnald Ramp in dermatology diagnosed clinically in the spring of 2022.      PREVIOUS RADIATION THERAPY: No   PAST MEDICAL HISTORY:  Past Medical History:  Diagnosis Date  . Cancer [facility]) 1983   Cervical cancer.  Skin Cancer- leg  . Chronic headaches    "? Migraines"- does not have then any more  . Constipation   . Elevated C-reactive protein (CRP)   . Family history of breast cancer    Mother  . Fatigue   . Heart murmur    ECHO 02/10/13 EF = 60-65%, moderate posterior mitral valve prolapse w/possible flail segment, severe MR, mitral regurgitant jet is anteriorly directed.  Marland Kitchen History of blood transfusion    pre-eclampsia   . Hyperpotassemia    Migrated  . Medicare annual wellness  visit, subsequent   . Mitral valve prolapse   . MR (mitral regurgitation)    ECHO 02/10/13 EF = 60-65%, moderate posterior mitral valve prolapse w/possible flail segment, severe MR, mitral regurgitant jet is anteriorly directed.  . Risk for falls   . Routine general medical examination at a health care facility   . S/P minimally invasive mitral valve repair 02/01/2016   Complex valvuloplasty including triangular resection of flail posterior leaflet, artificial Gore-tex neochord placement x10 and 28 mm Sorin Memo 3D ring annuloplasty via right mini thoracotomy approach  . Seizures ([place])    with pre- echa  . Shortness of breath dyspnea    with exertion       PAST SURGICAL HISTORY: Past Surgical History:  Procedure Laterality Date  . BREAST BIOPSY Right 1998   benign cyst  . CARDIAC CATHETERIZATION N/A 12/21/2015   Procedure: Right/Left Heart Cath and Coronary Angiography;  Surgeon: Sherren Mocha, MD;  Location: [place] CV LAB;  Service: Cardiovascular;  Laterality: N/A;  . COLONOSCOPY  08/2001   Colon cancer screening in 01/2012  . LAPAROSCOPIC CHOLECYSTECTOMY  1990  . left knee arthroscopic surgery Left 05/29/2018  . MITRAL VALVE REPAIR Right 02/01/2016   Procedure: MINIMALLY INVASIVE MITRAL VALVE REPAIR (MVR) with closure of PFO;  Surgeon: Rexene Alberts, MD;  Location: [place];  Service: Open Heart Surgery;  Laterality: Right;  . TEE  WITHOUT CARDIOVERSION N/A 10/14/2015   Procedure: TRANSESOPHAGEAL ECHOCARDIOGRAM (TEE);  Surgeon: Thayer Headings, MD;  Location: [facility];  Service: Cardiovascular;  Laterality: N/A;  . TEE WITHOUT CARDIOVERSION N/A 02/01/2016   Procedure: TRANSESOPHAGEAL ECHOCARDIOGRAM (TEE);  Surgeon: Rexene Alberts, MD;  Location: [place];  Service: Open Heart Surgery;  Laterality: N/A;  . TOTAL ABDOMINAL HYSTERECTOMY  1983   Cervical cancer 1983     FAMILY HISTORY:  Family History  Problem Relation Age of Onset  . Hypertension Mother   . Heart disease  Mother   . CAD Mother   . Breast cancer Mother 55  . AAA (abdominal aortic aneurysm) Father   . CAD Father   . Hypertension Father   . Heart disease Father   . High Cholesterol Brother   . Hypertension Brother   . Skin cancer Brother      SOCIAL HISTORY:  reports that she quit smoking about 35 years ago. Her smoking use included cigarettes. She quit after 25.00 years of use. She has never used smokeless tobacco. She reports current alcohol use of about 2.0 standard drinks of alcohol per week. She reports that she does not use drugs. The patient is married and lives in [place]. 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.    ALLERGIES: Penicillins, Codeine, Doxycycline hyclate, and Ultram [tramadol]   MEDICATIONS:  Current Outpatient Medications  Medication Sig Dispense Refill  . aspirin 81 MG tablet Take 81 mg by mouth daily.    . Biotin 1 MG CAPS Take 1 capsule by mouth daily. 30 capsule   . calcium citrate-vitamin D (CITRACAL+D) 315-200 MG-UNIT tablet Take 1 tablet by mouth 2 (two) times daily. VITAMIN D DOSAGE IS 250MG INSTEAD OF 200    . clindamycin (CLEOCIN) 300 MG capsule TAKE 2 CAPSULES BY MOUTH AS DIRECTED 1 HOUR PRIOR TO DENTAL WORK 2 capsule 0  . clotrimazole-betamethasone (LOTRISONE) cream Apply 1 application topically as needed.    . metoprolol tartrate (LOPRESSOR) 25 MG tablet TAKE 1/2 TABLET BY MOUTH EVERY MORNING, AND 1 TABLET EVERY EVENING 135 tablet 3   No current facility-administered medications for this encounter.     REVIEW OF SYSTEMS: On review of systems, the patient reports that she is doing well overall. She describes itching of her skin but that it seems to flare every few weeks. She notes topical creams and oral benadryl often help. Her main sites of lesions and itching are in the trunk anteriorly and posteriorly as well as of her arms bilaterally. She denies any concerns about her breast specifically.      PHYSICAL EXAM:  Wt Readings from Last 3 Encounters:  10/20/20 133 lb 2 oz (60.4 kg)  10/17/20 134 lb 14.4 oz (61.2 kg)  03/11/20 130 lb (59 kg)   Temp Readings from Last 3 Encounters:  10/20/20 (!) 97.4 F (36.3 C) (Temporal)  10/17/20 97.6 F (36.4 C) (Temporal)  10/29/18 (!) 97.2 F (36.2 C)   BP Readings from Last 3 Encounters:  10/20/20 117/76  10/17/20 133/63  03/11/20 (!) 100/50   Pulse Readings from Last 3 Encounters:  10/20/20 75  10/17/20 75  03/11/20 71    In general this is a well appearing caucasian female in no acute distress. She's alert and oriented x4 and appropriate throughout the examination. Cardiopulmonary assessment is negative for acute distress and she exhibits normal effort. Bilateral breast exam reveals small macular lesions that have a papular quality without  punctate changes. The lesions very in size from a few mm to up to 1 1/2 cm. She has the most of these on the posterior trunk above the shoulder blades and has over 20 in the trunk and arms. No fluid is noted in these or superficial skin avulsions.     ECOG = 1  0 - Asymptomatic (Fully active, able to carry on all predisease activities without restriction)  1 - Symptomatic but completely ambulatory (Restricted in physically strenuous activity but ambulatory and able to carry out work of a light or sedentary nature. For example, light housework, office work)  2 - Symptomatic, <50% in bed during the day (Ambulatory and capable of all self care but unable to carry out any work activities. Up and about more than 50% of waking hours)  3 - Symptomatic, >50% in bed, but not bedbound (Capable of only limited self-care, confined to bed or chair 50% or more of waking hours)  4 - Bedbound (Completely disabled. Cannot carry on any self-care. Totally confined to bed or chair)  5 - Death   Eustace Pen MM, Creech RH, Tormey DC, et al. [phone]). "Toxicity and response criteria of the [facility]  Group". [place] Oncol. 5 (6): 649-55    LABORATORY DATA:  Lab Results  Component Value Date   WBC 6.2 01/29/2020   HGB 11.9 01/29/2020   HCT 33.7 (L) 01/29/2020   MCV 96 01/29/2020   PLT 273 01/29/2020   Lab Results  Component Value Date   NA 138 03/11/2020   K 4.4 03/11/2020   CL 102 03/11/2020   CO2 26 03/11/2020   Lab Results  Component Value Date   ALT 17 01/30/2016   AST 26 01/30/2016   ALKPHOS 42 01/30/2016   BILITOT 0.4 01/30/2016      RADIOGRAPHY: No results found.     IMPRESSION/PLAN: 1. Stage IA, cT1cN0M0 grade 2, ER/PR positive invasive ductal carcinoma of hte left breast. Dr. Lisbeth Renshaw discusses the pathology findings and reviews the nature of early stage left breast disease. The consensus from the breast conference includes breast conservation with lumpectomy. Dr. Lindi Adie anticipates Oncotype Dx score to determine a role for systemic therapy. Provided that chemotherapy is not indicated, the patient's course would then be followed by external radiotherapy to the breast  to reduce risks of local recurrence followed by antiestrogen therapy. She is contemplating how she feels about antiestrogen therapy. We discussed the risks, benefits, short, and long term effects of radiotherapy, as well as the curative intent, and the patient is interested in proceeding. Dr. Lisbeth Renshaw discusses the delivery and logistics of radiotherapy and anticipates a course of 4 weeks of radiotherapy with deep inspiration breath hold technique. We will see her back a few weeks after surgery to discuss the simulation process and anticipate we starting radiotherapy about 4-6 weeks after surgery.  2. Grover's Disease as it pertains to #1. Dr. Lisbeth Renshaw discusses the limitations in few studies and case reports in the literature, however it does appear that some patients have been treated with radiotherapy for their skin condition. There does not appear to be any known or established scenarios that prohibit the  use of radiotherapy in patients with Grover's Disease. That being said, her skin will be closely watched during treatment and this will be considered at weekly intervals if her skin changes are more prominent than what is typically seen from radiotherapy.  In a visit lasting 75 minutes, greater than 50% of the time was spent  face to face reviewing her case, as well as in preparation of, discussing, and coordinating the patient's care.  The above documentation reflects my direct findings during this shared patient visit. Please see the separate note by Dr. Lisbeth Renshaw on this date for the remainder of the patient's plan of care.    Carola Rhine, [facility]    **Disclaimer: This note was dictated with voice recognition software. Similar sounding words can inadvertently be transcribed and this note may contain transcription errors which may not have been corrected upon publication of note.**

## 2020-10-20 NOTE — Progress Notes (Signed)
Chart reviewed with Dr. Sabra Heck, ok to proceed without any further cardiac clearance or cardiac testing. Spoke with Abigail Butts at Dr. Trevor Mace office who does not need patient to stop aspirin before surgery. Will review with patient at time of PAT phone call.

## 2020-10-21 ENCOUNTER — Encounter: Payer: Self-pay | Admitting: Licensed Clinical Social Worker

## 2020-10-21 NOTE — Progress Notes (Signed)
[facility] Psychosocial Distress Screening Clinical Social Work  Clinical Social Work was referred by distress screening protocol.  The patient scored a 8 on the Psychosocial Distress Thermometer which indicates severe distress. Clinical Social Worker attempted to contact patient by phone to assess for distress and other psychosocial needs.  No answer, left VM with direct contact information.  [facility] DISTRESS SCREENING 10/20/2020  Screening Type Initial Screening  Distress experienced in past week (1-10) 8  Family Problem type Partner;Children  Emotional problem type Depression;Adjusting to illness;Feeling hopeless  Physical Problem type Sleep/insomnia  Other Contact via phone [place], LCSW

## 2020-10-24 ENCOUNTER — Other Ambulatory Visit (HOSPITAL_COMMUNITY)
Admission: RE | Admit: 2020-10-24 | Discharge: 2020-10-24 | Disposition: A | Discharge status: Home / Self Care | Payer: Medicare PPO | Source: Ambulatory Visit | Attending: Surgery | Admitting: Surgery

## 2020-10-24 DIAGNOSIS — Z01812 Encounter for preprocedural laboratory examination: Secondary | ICD-10-CM | POA: Diagnosis not present

## 2020-10-24 DIAGNOSIS — Z20822 Contact with and (suspected) exposure to covid-19: Secondary | ICD-10-CM | POA: Insufficient documentation

## 2020-10-24 LAB — SARS CORONAVIRUS 2 (TAT 6-24 HRS): SARS Coronavirus 2: NEGATIVE

## 2020-10-25 ENCOUNTER — Other Ambulatory Visit: Payer: Self-pay

## 2020-10-25 ENCOUNTER — Ambulatory Visit
Admission: RE | Admit: 2020-10-25 | Discharge: 2020-10-25 | Disposition: A | Discharge status: Home / Self Care | Payer: Medicare PPO | Source: Ambulatory Visit | Attending: Surgery | Admitting: Surgery

## 2020-10-25 ENCOUNTER — Other Ambulatory Visit: Payer: Self-pay | Admitting: Surgery

## 2020-10-25 DIAGNOSIS — Z853 Personal history of malignant neoplasm of breast: Secondary | ICD-10-CM

## 2020-10-25 DIAGNOSIS — C50912 Malignant neoplasm of unspecified site of left female breast: Secondary | ICD-10-CM | POA: Diagnosis not present

## 2020-10-25 DIAGNOSIS — C50212 Malignant neoplasm of upper-inner quadrant of left female breast: Secondary | ICD-10-CM | POA: Diagnosis not present

## 2020-10-25 MED ORDER — ENSURE PRE-SURGERY PO LIQD: 296.0000 mL | Freq: Once | ORAL | Status: DC

## 2020-10-25 NOTE — Progress Notes (Signed)
Surgical soap given with instructions, pt verbalized understanding.  Pt states she will drink 8 ounces of Water instead of pre surgery drink.

## 2020-10-26 NOTE — H&P (Signed)
Destiny Andersen [id number] Appointment: 10/05/2020 2:50 PM Location: [facility] Patient #: [id number] DOB: 11/03/1945 Married / Language: Destiny Andersen / Race: White Female   History of Present Illness (Destiny Andersen A. Ninfa Linden MD; 10/05/2020 3:28 PM) The patient is a 75 year old female who presents with breast cancer.  Chief complaint: Left breast cancer  This is a 75 year old female who had gone for screening mammography when she is found to have a mass in the upper inner quadrant of the left breast. She believes she can feel it as early as January after doing a lot of rowing and exercising. She has had no previous history of breast problems. She denies nipple discharge. She underwent an ultrasound the mass showing it measured 1.3 cm in size. The axilla was unremarkable. The mass was biopsied. The biopsy showed it to be an invasive ductal carcinoma which was 90% ER positive, 90% PR positive, HER-2 negative, with again a Ki-67 15%. She has a family history of breast cancer mother in her 56s. Her mother had a lumpectomy and radiation therapy and passed away in her late 79s not from breast cancer. The patient is otherwise healthy. 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   Past Surgical History Destiny Forehand, CNA; 10/05/2020 2:54 PM) Breast Biopsy  Left. Cataract Surgery  Bilateral. Gallbladder Surgery - Laparoscopic  Hysterectomy (due to cancer) - Partial  Knee Surgery  Left. Oral Surgery  Valve Replacement   Diagnostic Studies History Destiny Forehand, CNA; 10/05/2020 2:54 PM) Colonoscopy  5-10 years ago Mammogram  within last year Pap Smear  1-5 years ago  Allergies Destiny Forehand, CNA; 10/05/2020 2:57 PM) Acetaminophen-Codeine #2 *ANALGESICS - OPIOID*  HYDROcodone-Ibuprofen *ANALGESICS - OPIOID*  Percocet *ANALGESICS - OPIOID*  Darvocet A500 *ANALGESICS - OPIOID*  penicillAMINE *CHEMICALS*  Penicillin G Potassium *PENICILLINS*  traMADol HCl *ANALGESICS - OPIOID*   Allergies Reconciled   Medication History Destiny Forehand, CNA; 10/05/2020 2:57 PM) Boostrix (5-2.5-18.5LF-MCG/0.5 Susp Pref Syr, Intramuscular) Active. Clindamycin HCl (300MG Capsule, Oral) Active. Clotrimazole-Betamethasone (1-0.05% Cream, External) Active. Estradiol (10MCG Tablet, Vaginal) Active. Fluad Quadrivalent (0.5ML Prefilled Syr, Intramuscular) Active. Fluconazole (150MG Tablet, Oral) Active. Metoprolol Tartrate (25MG Tablet, Oral) Active. predniSONE (50MG Tablet, Oral) Active. Triamcinolone Acetonide (0.1% Cream, External) Active. Triamcinolone Acetonide (0.1% Ointment, External) Active. Medications Reconciled  Social History Destiny Forehand, CNA; 10/05/2020 2:54 PM) Alcohol use  Remotely quit alcohol use. Caffeine use  Tea. No drug use  Tobacco use  Former smoker.  Family History Destiny Forehand, CNA; 10/05/2020 2:54 PM) Arthritis  Mother. Breast Cancer  Mother. Cancer  Brother, Mother. Heart Disease  Father. Hypertension  Brother, Father, Mother. Seizure disorder  Mother. Thyroid problems  Mother.  Pregnancy / Birth History Destiny Forehand, CNA; 10/05/2020 2:54 PM) Age at menarche  29 years. Age of menopause  52-50 Contraceptive History  Oral contraceptives. Gravida  1 Maternal age  54-20 Para  65  Other Problems Destiny Forehand, CNA; 10/05/2020 2:54 PM) Cervical Cancer  Cholelithiasis  Heart murmur  Melanoma  Migraine Headache     Review of Systems Destiny Reams [place] CNA; 10/05/2020 2:54 PM) General Not Present- Appetite Loss, Chills, Fatigue, Fever, Night Sweats, Weight Gain and Weight Loss. Skin Present- Rash. Not Present- Change in Wart/Mole, Dryness, Hives, Jaundice, New Lesions, Non-Healing Wounds and Ulcer. HEENT Present- Wears glasses/contact lenses. Not Present- Earache, Hearing Loss, Hoarseness, Nose Bleed, Oral Ulcers, Ringing in the Ears, Seasonal Allergies, Sinus Pain, Sore Throat, Visual Disturbances and  Yellow Eyes. Respiratory Not Present- Bloody sputum, Chronic Cough, Difficulty Breathing, Snoring and Wheezing.  Breast Present- Breast Mass. Not Present- Breast Pain, Nipple Discharge and Skin Changes. Cardiovascular Present- Swelling of Extremities. Not Present- Chest Pain, Difficulty Breathing Lying Down, Leg Cramps, Palpitations, Rapid Heart Rate and Shortness of Breath. Gastrointestinal Not Present- Abdominal Pain, Bloating, Bloody Stool, Change in Bowel Habits, Chronic diarrhea, Constipation, Difficulty Swallowing, Excessive gas, Gets full quickly at meals, Hemorrhoids, Indigestion, Nausea, Rectal Pain and Vomiting. Female Genitourinary Present- Frequency. Not Present- Nocturia, Painful Urination, Pelvic Pain and Urgency. Musculoskeletal Not Present- Back Pain, Joint Pain, Joint Stiffness, Muscle Pain, Muscle Weakness and Swelling of Extremities. Neurological Not Present- Decreased Memory, Fainting, Headaches, Numbness, Seizures, Tingling, Tremor, Trouble walking and Weakness. Psychiatric Not Present- Anxiety, Bipolar, Change in Sleep Pattern, Depression, Fearful and Frequent crying. Endocrine Present- Cold Intolerance and Hair Changes. Not Present- Excessive Hunger, Heat Intolerance, Hot flashes and New Diabetes. Hematology Not Present- Blood Thinners, Easy Bruising, Excessive bleeding, Gland problems, HIV and Persistent Infections.  Vitals Destiny Reams [place] CNA; 10/05/2020 2:58 PM) 10/05/2020 2:57 PM Weight: 133.5 lb Height: 65in Body Surface Area: 1.67 m Body Mass Index: 22.22 kg/m  Temp.: 97.23F  Pulse: 91 (Regular)  P.OX: 98% (Room air) BP: 130/70(Sitting, Left Arm, Standard)       Physical Exam (Destiny Andersen A. Ninfa Linden MD; 10/05/2020 3:29 PM) The physical exam findings are as follows: Note: She appears well on exam  She has ecchymosis on the left breast from the biopsy. There is a vague fullness at the 11 o'clock position of the left breast more than 12 cm from the  nipple. There is no axillary adenopathy. There is no arm swelling. The nipple areolar complex is normal.    Assessment & Plan (Destiny Andersen A. Ninfa Linden MD; 10/05/2020 3:31 PM) BREAST CANCER, LEFT (C50.912) Impression: I reviewed her notes from electronic medical records. I reviewed her mammogram, ultrasound, and pathology results. She has an invasive ductal carcinoma left breast which is ER/PR positive, HER-2 negative. I had a long discussion with the patient and her family regarding the diagnosis. We then long discussion regarding breast cancer. We discussed breast conservation versus mastectomy. She is interested in breast conservation. She would be a candidate for a radioactive seed guided left breast lumpectomy. I discussed procedure with them in detail. I discussed the risk which includes but is not limited to bleeding, infection, injury to surrounding structures, the need for further procedures if the margins are positive, cardiopulmonary issues, postoperative recovery, etc. We will refer her to both medical and radiation oncology. Surgery will be scheduled as soon as possible.

## 2020-10-27 ENCOUNTER — Encounter (HOSPITAL_BASED_OUTPATIENT_CLINIC_OR_DEPARTMENT_OTHER)
Admission: RE | Disposition: A | Discharge status: Home / Self Care | Payer: Self-pay | Source: Home / Self Care | Attending: Surgery

## 2020-10-27 ENCOUNTER — Ambulatory Visit (HOSPITAL_BASED_OUTPATIENT_CLINIC_OR_DEPARTMENT_OTHER): Payer: Medicare PPO | Admitting: Anesthesiology

## 2020-10-27 ENCOUNTER — Encounter (HOSPITAL_BASED_OUTPATIENT_CLINIC_OR_DEPARTMENT_OTHER): Payer: Self-pay | Admitting: Surgery

## 2020-10-27 ENCOUNTER — Ambulatory Visit
Admission: RE | Admit: 2020-10-27 | Discharge: 2020-10-27 | Disposition: A | Discharge status: Home / Self Care | Payer: Medicare PPO | Source: Ambulatory Visit | Attending: Surgery | Admitting: Surgery

## 2020-10-27 ENCOUNTER — Other Ambulatory Visit: Payer: Self-pay

## 2020-10-27 ENCOUNTER — Ambulatory Visit (HOSPITAL_BASED_OUTPATIENT_CLINIC_OR_DEPARTMENT_OTHER)
Admission: RE | Admit: 2020-10-27 | Discharge: 2020-10-27 | Disposition: A | Discharge status: Home / Self Care | Payer: Medicare PPO | Attending: Surgery | Admitting: Surgery

## 2020-10-27 DIAGNOSIS — I340 Nonrheumatic mitral (valve) insufficiency: Secondary | ICD-10-CM | POA: Diagnosis not present

## 2020-10-27 DIAGNOSIS — Z85820 Personal history of malignant melanoma of skin: Secondary | ICD-10-CM | POA: Diagnosis not present

## 2020-10-27 DIAGNOSIS — Z803 Family history of malignant neoplasm of breast: Secondary | ICD-10-CM | POA: Insufficient documentation

## 2020-10-27 DIAGNOSIS — Z7952 Long term (current) use of systemic steroids: Secondary | ICD-10-CM | POA: Diagnosis not present

## 2020-10-27 DIAGNOSIS — Z87891 Personal history of nicotine dependence: Secondary | ICD-10-CM | POA: Insufficient documentation

## 2020-10-27 DIAGNOSIS — Z79890 Hormone replacement therapy (postmenopausal): Secondary | ICD-10-CM | POA: Insufficient documentation

## 2020-10-27 DIAGNOSIS — Z8541 Personal history of malignant neoplasm of cervix uteri: Secondary | ICD-10-CM | POA: Diagnosis not present

## 2020-10-27 DIAGNOSIS — Z79899 Other long term (current) drug therapy: Secondary | ICD-10-CM | POA: Insufficient documentation

## 2020-10-27 DIAGNOSIS — C50912 Malignant neoplasm of unspecified site of left female breast: Secondary | ICD-10-CM | POA: Diagnosis not present

## 2020-10-27 DIAGNOSIS — R928 Other abnormal and inconclusive findings on diagnostic imaging of breast: Secondary | ICD-10-CM | POA: Diagnosis not present

## 2020-10-27 DIAGNOSIS — Z853 Personal history of malignant neoplasm of breast: Secondary | ICD-10-CM

## 2020-10-27 DIAGNOSIS — Z885 Allergy status to narcotic agent status: Secondary | ICD-10-CM | POA: Insufficient documentation

## 2020-10-27 DIAGNOSIS — I959 Hypotension, unspecified: Secondary | ICD-10-CM | POA: Diagnosis not present

## 2020-10-27 DIAGNOSIS — C50212 Malignant neoplasm of upper-inner quadrant of left female breast: Secondary | ICD-10-CM | POA: Diagnosis not present

## 2020-10-27 DIAGNOSIS — Z170 Estrogen receptor positive status [ER+]: Secondary | ICD-10-CM | POA: Insufficient documentation

## 2020-10-27 DIAGNOSIS — L111 Transient acantholytic dermatosis [Grover]: Secondary | ICD-10-CM | POA: Diagnosis not present

## 2020-10-27 DIAGNOSIS — Z880 Allergy status to penicillin: Secondary | ICD-10-CM | POA: Diagnosis not present

## 2020-10-27 HISTORY — PX: BREAST LUMPECTOMY WITH RADIOACTIVE SEED LOCALIZATION: SHX6424

## 2020-10-27 SURGERY — BREAST LUMPECTOMY WITH RADIOACTIVE SEED LOCALIZATION
Anesthesia: General | Site: Breast | Laterality: Left

## 2020-10-27 MED ORDER — HYDROMORPHONE HCL 1 MG/ML IJ SOLN: 0.2500 mg | INTRAMUSCULAR | Status: DC | PRN

## 2020-10-27 MED ORDER — ACETAMINOPHEN 500 MG PO TABS
1000.0000 mg | ORAL_TABLET | ORAL | Status: AC
  Administered 2020-10-27: 1000 mg via ORAL

## 2020-10-27 MED ORDER — PROPOFOL 10 MG/ML IV BOLUS
INTRAVENOUS | Status: AC
  Filled 2020-10-27: qty 20

## 2020-10-27 MED ORDER — ACETAMINOPHEN 500 MG PO TABS
ORAL_TABLET | ORAL | Status: AC
  Filled 2020-10-27: qty 2

## 2020-10-27 MED ORDER — PROMETHAZINE HCL 25 MG/ML IJ SOLN: 6.2500 mg | INTRAMUSCULAR | Status: DC | PRN

## 2020-10-27 MED ORDER — CHLORHEXIDINE GLUCONATE CLOTH 2 % EX PADS: 6.0000 | MEDICATED_PAD | Freq: Once | CUTANEOUS | Status: DC

## 2020-10-27 MED ORDER — AMISULPRIDE (ANTIEMETIC) 5 MG/2ML IV SOLN: 10.0000 mg | Freq: Once | INTRAVENOUS | Status: DC | PRN

## 2020-10-27 MED ORDER — OXYCODONE HCL 5 MG PO TABS: 5.0000 mg | ORAL_TABLET | Freq: Once | ORAL | Status: DC | PRN

## 2020-10-27 MED ORDER — OXYCODONE HCL 5 MG PO TABS: 5.0000 mg | ORAL_TABLET | Freq: Four times a day (QID) | ORAL | 0 refills | Status: DC | PRN

## 2020-10-27 MED ORDER — LACTATED RINGERS IV SOLN: INTRAVENOUS | Status: DC

## 2020-10-27 MED ORDER — ONDANSETRON HCL 4 MG/2ML IJ SOLN
INTRAMUSCULAR | Status: DC | PRN
  Administered 2020-10-27: 4 mg via INTRAVENOUS

## 2020-10-27 MED ORDER — FENTANYL CITRATE (PF) 100 MCG/2ML IJ SOLN
INTRAMUSCULAR | Status: DC | PRN
  Administered 2020-10-27: 25 ug via INTRAVENOUS
  Administered 2020-10-27: 50 ug via INTRAVENOUS
  Administered 2020-10-27: 25 ug via INTRAVENOUS

## 2020-10-27 MED ORDER — OXYCODONE HCL 5 MG/5ML PO SOLN: 5.0000 mg | Freq: Once | ORAL | Status: DC | PRN

## 2020-10-27 MED ORDER — EPHEDRINE 5 MG/ML INJ
INTRAVENOUS | Status: AC
  Filled 2020-10-27: qty 10

## 2020-10-27 MED ORDER — PROPOFOL 10 MG/ML IV BOLUS
INTRAVENOUS | Status: DC | PRN
  Administered 2020-10-27: 160 mg via INTRAVENOUS

## 2020-10-27 MED ORDER — ONDANSETRON HCL 4 MG/2ML IJ SOLN
INTRAMUSCULAR | Status: AC
  Filled 2020-10-27: qty 2

## 2020-10-27 MED ORDER — LIDOCAINE HCL (CARDIAC) PF 100 MG/5ML IV SOSY
PREFILLED_SYRINGE | INTRAVENOUS | Status: DC | PRN
  Administered 2020-10-27: 60 mg via INTRATRACHEAL

## 2020-10-27 MED ORDER — BUPIVACAINE-EPINEPHRINE 0.5% -1:200000 IJ SOLN
INTRAMUSCULAR | Status: DC | PRN
  Administered 2020-10-27: 17 mL

## 2020-10-27 MED ORDER — CIPROFLOXACIN IN D5W 400 MG/200ML IV SOLN
INTRAVENOUS | Status: AC
  Filled 2020-10-27: qty 200

## 2020-10-27 MED ORDER — LIDOCAINE 2% (20 MG/ML) 5 ML SYRINGE
INTRAMUSCULAR | Status: AC
  Filled 2020-10-27: qty 5

## 2020-10-27 MED ORDER — DEXAMETHASONE SODIUM PHOSPHATE 10 MG/ML IJ SOLN
INTRAMUSCULAR | Status: DC | PRN
  Administered 2020-10-27: 5 mg via INTRAVENOUS

## 2020-10-27 MED ORDER — CIPROFLOXACIN IN D5W 400 MG/200ML IV SOLN
400.0000 mg | INTRAVENOUS | Status: AC
  Administered 2020-10-27: 400 mg via INTRAVENOUS

## 2020-10-27 MED ORDER — DEXAMETHASONE SODIUM PHOSPHATE 10 MG/ML IJ SOLN
INTRAMUSCULAR | Status: AC
  Filled 2020-10-27: qty 1

## 2020-10-27 MED ORDER — FENTANYL CITRATE (PF) 100 MCG/2ML IJ SOLN
INTRAMUSCULAR | Status: AC
  Filled 2020-10-27: qty 2

## 2020-10-27 MED ORDER — EPHEDRINE SULFATE 50 MG/ML IJ SOLN
INTRAMUSCULAR | Status: DC | PRN
  Administered 2020-10-27: 10 mg via INTRAVENOUS

## 2020-10-27 SURGICAL SUPPLY — 50 items
ADH SKN CLS APL DERMABOND .7 (GAUZE/BANDAGES/DRESSINGS) ×1
APL PRP STRL LF DISP 70% ISPRP (MISCELLANEOUS) ×1
APPLIER CLIP 9.375 MED OPEN (MISCELLANEOUS)
APR CLP MED 9.3 20 MLT OPN (MISCELLANEOUS)
BINDER BREAST 3XL (GAUZE/BANDAGES/DRESSINGS) IMPLANT
BINDER BREAST LRG (GAUZE/BANDAGES/DRESSINGS) ×1 IMPLANT
BINDER BREAST MEDIUM (GAUZE/BANDAGES/DRESSINGS) IMPLANT
BINDER BREAST XLRG (GAUZE/BANDAGES/DRESSINGS) IMPLANT
BINDER BREAST XXLRG (GAUZE/BANDAGES/DRESSINGS) IMPLANT
BLADE SURG 15 STRL LF DISP TIS (BLADE) ×1 IMPLANT
BLADE SURG 15 STRL SS (BLADE) ×2
CANISTER SUC SOCK COL 7IN (MISCELLANEOUS) IMPLANT
CANISTER SUCT 1200ML W/VALVE (MISCELLANEOUS) IMPLANT
CHLORAPREP W/TINT 26 (MISCELLANEOUS) ×2 IMPLANT
CLIP APPLIE 9.375 MED OPEN (MISCELLANEOUS) IMPLANT
CLIP VESOCCLUDE MED 6/CT (CLIP) ×1 IMPLANT
COVER BACK TABLE 60X90IN (DRAPES) ×2 IMPLANT
COVER MAYO STAND STRL (DRAPES) ×2 IMPLANT
COVER PROBE W GEL 5X96 (DRAPES) ×2 IMPLANT
COVER WAND RF STERILE (DRAPES) IMPLANT
DECANTER SPIKE VIAL GLASS SM (MISCELLANEOUS) ×1 IMPLANT
DERMABOND ADVANCED (GAUZE/BANDAGES/DRESSINGS) ×1
DERMABOND ADVANCED .7 DNX12 (GAUZE/BANDAGES/DRESSINGS) ×1 IMPLANT
DRAPE LAPAROSCOPIC ABDOMINAL (DRAPES) ×2 IMPLANT
DRAPE UTILITY XL STRL (DRAPES) ×2 IMPLANT
ELECT REM PT RETURN 9FT ADLT (ELECTROSURGICAL) ×2
ELECTRODE REM PT RTRN 9FT ADLT (ELECTROSURGICAL) ×1 IMPLANT
GAUZE SPONGE 4X4 12PLY STRL LF (GAUZE/BANDAGES/DRESSINGS) IMPLANT
GLOVE SURG SIGNA 7.5 PF LTX (GLOVE) ×2 IMPLANT
GOWN STRL REUS W/ TWL LRG LVL3 (GOWN DISPOSABLE) ×1 IMPLANT
GOWN STRL REUS W/ TWL XL LVL3 (GOWN DISPOSABLE) ×1 IMPLANT
GOWN STRL REUS W/TWL LRG LVL3 (GOWN DISPOSABLE) ×2
GOWN STRL REUS W/TWL XL LVL3 (GOWN DISPOSABLE) ×4
KIT MARKER MARGIN INK (KITS) ×2 IMPLANT
NDL HYPO 25X1 1.5 SAFETY (NEEDLE) ×1 IMPLANT
NEEDLE HYPO 25X1 1.5 SAFETY (NEEDLE) ×2 IMPLANT
NS IRRIG 1000ML POUR BTL (IV SOLUTION) ×1 IMPLANT
PACK BASIN DAY SURGERY FS (CUSTOM PROCEDURE TRAY) ×2 IMPLANT
PENCIL SMOKE EVACUATOR (MISCELLANEOUS) ×2 IMPLANT
SLEEVE SCD COMPRESS KNEE MED (STOCKING) ×2 IMPLANT
SPONGE LAP 4X18 RFD (DISPOSABLE) ×2 IMPLANT
SUT MNCRL AB 4-0 PS2 18 (SUTURE) ×2 IMPLANT
SUT SILK 2 0 SH (SUTURE) IMPLANT
SUT VIC AB 3-0 SH 27 (SUTURE) ×4
SUT VIC AB 3-0 SH 27X BRD (SUTURE) ×1 IMPLANT
SYR CONTROL 10ML LL (SYRINGE) ×2 IMPLANT
TOWEL GREEN STERILE FF (TOWEL DISPOSABLE) ×2 IMPLANT
TRAY FAXITRON CT DISP (TRAY / TRAY PROCEDURE) ×2 IMPLANT
TUBE CONNECTING 20X1/4 (TUBING) IMPLANT
YANKAUER SUCT BULB TIP NO VENT (SUCTIONS) IMPLANT

## 2020-10-27 NOTE — Anesthesia Preprocedure Evaluation (Addendum)
Anesthesia Evaluation  Patient identified by MRN, date of birth, ID band Patient awake    Reviewed: Allergy & Precautions, NPO status , Patient's Chart, lab work & pertinent test results  History of Anesthesia Complications Negative for: history of anesthetic complications  Airway Mallampati: I  TM Distance: >3 FB Neck ROM: Full    Dental  (+) Teeth Intact, Caps, Dental Advisory Given   Pulmonary shortness of breath and with exertion, former smoker,    breath sounds clear to auscultation       Cardiovascular (-) angina+ Valvular Problems/Murmurs (severe MR)  Rhythm:Regular Rate:Normal  7/17 cath: Patent coronary arteries, right-dominant, with no obstructive CAD, Vigorous LV systolic function without regional wall motion abnormalities, Severe mitral regurgitation, Normal right-sided heart pressures  4/17 TEE: Normal LVF, Mitral valve:  There was an eccentric jet of MR that appeared to wrap around the left atrium. There was no evidence of flow reversal in the pulmonary veins. Severe prolapse, involving the posterior leaflet.  Doppler: no evidence for stenosis, there is moderate to severe regurgitation.     Neuro/Psych  Headaches,    GI/Hepatic negative GI ROS, Neg liver ROS,   Endo/Other  negative endocrine ROS  Renal/GU negative Renal ROS     Musculoskeletal H/o TMJ   Abdominal   Peds  Hematology negative hematology ROS (+)   Anesthesia Other Findings Breast Cancer  Reproductive/Obstetrics                             Anesthesia Physical  Anesthesia Plan  ASA: III  Anesthesia Plan: General   Post-op Pain Management:    Induction: Intravenous  PONV Risk Score and Plan: 3 and Ondansetron, Dexamethasone, Midazolam and Treatment may vary due to age or medical condition  Airway Management Planned: LMA  Additional Equipment: None  Intra-op Plan:   Post-operative Plan: Extubation in  OR  Informed Consent: I have reviewed the patients History and Physical, chart, labs and discussed the procedure including the risks, benefits and alternatives for the proposed anesthesia with the patient or authorized representative who has indicated his/her understanding and acceptance.     Dental advisory given  Plan Discussed with: CRNA and Surgeon  Anesthesia Plan Comments:         Anesthesia Quick Evaluation

## 2020-10-27 NOTE — Anesthesia Procedure Notes (Signed)
Procedure Name: LMA Insertion Date/Time: 10/27/2020 9:13 AM Performed by: Glory Buff, CRNA Pre-anesthesia Checklist: Patient identified, Emergency Drugs available, Suction available and Patient being monitored Patient Re-evaluated:Patient Re-evaluated prior to induction Oxygen Delivery Method: Circle system utilized Preoxygenation: Pre-oxygenation with 100% oxygen Induction Type: IV induction LMA: LMA inserted LMA Size: 4.0 Number of attempts: 1 Placement Confirmation: positive ETCO2 Tube secured with: Tape Dental Injury: Teeth and Oropharynx as per pre-operative assessment

## 2020-10-27 NOTE — Transfer of Care (Signed)
Immediate Anesthesia Transfer of Care Note  Patient: Destiny Andersen [id number]  Procedure(s) Performed: LEFT BREAST LUMPECTOMY WITH RADIOACTIVE SEED LOCALIZATION (Left Breast)  Patient Location: PACU  Anesthesia Type:General  Level of Consciousness: drowsy, patient cooperative and responds to stimulation  Airway & Oxygen Therapy: Patient Spontanous Breathing and Patient connected to face mask oxygen  Post-op Assessment: Report given to RN and Post -op Vital signs reviewed and stable  Post vital signs: Reviewed and stable  Last Vitals:  Vitals Value Taken Time  BP 111/65 10/27/20 1009  Temp    Pulse 69 10/27/20 1010  Resp    SpO2 100 % 10/27/20 1010  Vitals shown include unvalidated device data.  Last Pain:  Vitals:   10/27/20 0801  TempSrc: Oral  PainSc: 0-No pain         Complications: No complications documented.

## 2020-10-27 NOTE — Interval H&P Note (Signed)
History and Physical Interval Note:no change in H and P  10/27/2020 8:42 AM  Destiny Andersen  has presented today for surgery, with the diagnosis of LEFT BREAST CANCER.  The various methods of treatment have been discussed with the patient and family. After consideration of risks, benefits and other options for treatment, the patient has consented to  Procedure(s): LEFT BREAST LUMPECTOMY WITH RADIOACTIVE SEED LOCALIZATION (Left) as a surgical intervention.  The patient's history has been reviewed, patient examined, no change in status, stable for surgery.  I have reviewed the patient's chart and labs.  Questions were answered to the patient's satisfaction.     Coralie Keens

## 2020-10-27 NOTE — Anesthesia Postprocedure Evaluation (Signed)
Anesthesia Post Note  Patient: Destiny Andersen [id number]  Procedure(s) Performed: LEFT BREAST LUMPECTOMY WITH RADIOACTIVE SEED LOCALIZATION (Left Breast)     Patient location during evaluation: PACU Anesthesia Type: General Level of consciousness: awake and alert Pain management: pain level controlled Vital Signs Assessment: post-procedure vital signs reviewed and stable Respiratory status: spontaneous breathing, nonlabored ventilation and respiratory function stable Cardiovascular status: blood pressure returned to baseline and stable Postop Assessment: no apparent nausea or vomiting Anesthetic complications: no   No complications documented.  Last Vitals:  Vitals:   10/27/20 1030 10/27/20 1052  BP: 111/60 125/70  Pulse: 67 67  Resp: 15 16  Temp:  (!) 36.4 C  SpO2: 97% 96%    Last Pain:  Vitals:   10/27/20 1052  TempSrc:   PainSc: 2                  Lynda Rainwater

## 2020-10-27 NOTE — Discharge Instructions (Signed)
Destiny Andersen Office Phone Number [phone]  BREAST BIOPSY/ PARTIAL MASTECTOMY: POST OP INSTRUCTIONS  Always review your discharge instruction sheet given to you by the facility where your surgery was performed.  IF YOU HAVE DISABILITY OR FAMILY LEAVE FORMS, YOU MUST BRING THEM TO THE OFFICE FOR PROCESSING.  DO NOT GIVE THEM TO YOUR DOCTOR.  1. A prescription for pain medication may be given to you upon discharge.  Take your pain medication as prescribed, if needed.  If narcotic pain medicine is not needed, then you may take acetaminophen (Tylenol) or ibuprofen (Advil) as needed. 2. Take your usually prescribed medications unless otherwise directed 3. If you need a refill on your pain medication, please contact your pharmacy.  They will contact our office to request authorization.  Prescriptions will not be filled after 5pm or on week-ends. 4. You should eat very light the first 24 hours after surgery, such as soup, crackers, pudding, etc.  Resume your normal diet the day after surgery. 5. Most patients will experience some swelling and bruising in the breast.  Ice packs and a good support bra will help.  Swelling and bruising can take several days to resolve.  6. It is common to experience some constipation if taking pain medication after surgery.  Increasing fluid intake and taking a stool softener will usually help or prevent this problem from occurring.  A mild laxative (Milk of Magnesia or Miralax) should be taken according to package directions if there are no bowel movements after 48 hours. 7. Unless discharge instructions indicate otherwise, you may remove your bandages 24-48 hours after surgery, and you may shower at that time.  You may have steri-strips (small skin tapes) in place directly over the incision.  These strips should be left on the skin for 7-10 days.  If your surgeon used skin glue on the incision, you may shower in 24 hours.  The glue will flake off over the  next 2-3 weeks.  Any sutures or staples will be removed at the office during your follow-up visit. 8. ACTIVITIES:  You may resume regular daily activities (gradually increasing) beginning the next day.  Wearing a good support bra or sports bra minimizes pain and swelling.  You may have sexual intercourse when it is comfortable. a. You may drive when you no longer are taking prescription pain medication, you can comfortably wear a seatbelt, and you can safely maneuver your car and apply brakes. b. RETURN TO WORK:  ______________________________________________________________________________________ 9. You should see your doctor in the office for a follow-up appointment approximately two weeks after your surgery.  Your doctor's nurse will typically make your follow-up appointment when she calls you with your pathology report.  Expect your pathology report 2-3 business days after your surgery.  You may call to check if you do not hear from [place] after three days. 10. OTHER INSTRUCTIONS:ok to shower starting tomorrow 11. Ice pack and tylenol also for pain 12.  no vigorous activity for 1 week 13. ______________________________________________________________________________________________ _____________________________________________________________________________________________________________________________________ _____________________________________________________________________________________________________________________________________ _____________________________________________________________________________________________________________________________________  WHEN TO CALL YOUR DOCTOR: 1. Fever over 101.0 2. Nausea and/or vomiting. 3. Extreme swelling or bruising. 4. Continued bleeding from incision. 5. Increased pain, redness, or drainage from the incision.  The clinic staff is available to answer your questions during regular business hours.  Please don't hesitate to call and  ask to speak to one of the nurses for clinical concerns.  If you have a medical emergency, go to the nearest emergency room or call 911.  A surgeon from [facility] is always on call at the hospital.  For further questions, please visit [email or web address]    Post Anesthesia Home Care Instructions  Activity: Get plenty of rest for the remainder of the day. A responsible individual must stay with you for 24 hours following the procedure.  For the next 24 hours, DO NOT: -Drive a car -Paediatric nurse -Drink alcoholic beverages -Take any medication unless instructed by your physician -Make any legal decisions or sign important papers.  Meals: Start with liquid foods such as gelatin or soup. Progress to regular foods as tolerated. Avoid greasy, spicy, heavy foods. If nausea and/or vomiting occur, drink only clear liquids until the nausea and/or vomiting subsides. Call your physician if vomiting continues.  Special Instructions/Symptoms: Your throat may feel dry or sore from the anesthesia or the breathing tube placed in your throat during surgery. If this causes discomfort, gargle with warm salt water. The discomfort should disappear within 24 hours.  If you had a scopolamine patch placed behind your ear for the management of post- operative nausea and/or vomiting:  1. The medication in the patch is effective for 72 hours, after which it should be removed.  Wrap patch in a tissue and discard in the trash. Wash hands thoroughly with soap and water. 2. You may remove the patch earlier than 72 hours if you experience unpleasant side effects which may include dry mouth, dizziness or visual disturbances. 3. Avoid touching the patch. Wash your hands with soap and water after contact with the patch.

## 2020-10-27 NOTE — Op Note (Signed)
LEFT BREAST LUMPECTOMY WITH RADIOACTIVE SEED LOCALIZATION  Procedure Note  Destiny Andersen 10/27/2020   Pre-op Diagnosis: LEFT BREAST CANCER     Post-op Diagnosis: same  Procedure(s): LEFT BREAST LUMPECTOMY WITH RADIOACTIVE SEED LOCALIZATION  Surgeon(s): Coralie Keens, MD  Anesthesia: General  Staff:  Circulator: McDonough-Hughes, Delene Ruffini, RN Relief Circulator: Faythe Dingwall, RN Scrub Person: Burroughs, Lisbeth Renshaw, RN Wynelle Link, MD  Estimated Blood Loss: Minimal               Specimens: sent to path  Indications: This is a 75 year old female found to have a left breast cancer measuring at least 1.3 cm in size in the upper inner quadrant of the left breast near the clavicle.  The decision was made to proceed with a radioactive seed guided left breast lumpectomy  Procedure: The patient was brought to the operating room identifies correct patient.  She was placed upon the operating table general anesthesia was induced.  Her left chest and breast were then prepped and draped in usual sterile fashion.  The radioactive seed was located on the upper chest and the inner quadrant.  It was too far from the nipple areolar complex to make an incision there so I elected to make the incision over the top of the tumor.  We identified the seed with the neoprobe in the appropriate area.  The skin was anesthetized with Marcaine and an elliptical incision was made around the area transversely.  This was then taken down into the subcutaneous tissue electrocautery.  The breast tissue was then dissected superiorly and inferiorly going toward the clavicle superiorly.  With the aid of the neoprobe we stayed widely around the seed.  The tumor itself was barely palpable.  We took the incision all the way down to the chest wall and removed all breast tissue as we completed a lumpectomy removing the specimen off of the chest wall.  We then marked the margins with paint.  An x-ray was performed on specimen  confirming that the radioactive seed and previous biopsy clip were in the specimen.  It was then sent to pathology for evaluation.  We achieved hemostasis with the cautery.  Surgical clips were then placed around the periphery of the biopsy cavity.  The deep tissue was then closed interrupted 3-0 Vicryl sutures.  The subcutaneous tissue was then closed interrupted 3-0 Vicryl sutures and the skin was closed with running 4-0 Monocryl.  Dermabond was then applied.  The patient tolerated the procedure well.  All the counts were correct at the end of the procedure.  The patient was then extubated in the operating room and taken in a stable condition to the recovery room.           Coralie Keens   Date: 10/27/2020  Time: 9:57 AM

## 2020-10-28 ENCOUNTER — Encounter (HOSPITAL_BASED_OUTPATIENT_CLINIC_OR_DEPARTMENT_OTHER): Payer: Self-pay | Admitting: Surgery

## 2020-10-31 ENCOUNTER — Telehealth: Payer: Self-pay | Admitting: *Deleted

## 2020-10-31 ENCOUNTER — Encounter: Payer: Self-pay | Admitting: *Deleted

## 2020-10-31 LAB — SURGICAL PATHOLOGY

## 2020-10-31 NOTE — Telephone Encounter (Signed)
Received order for oncotype testing. Requisition faxed to pathology and [facility] °

## 2020-11-01 ENCOUNTER — Ambulatory Visit: Payer: Medicare PPO | Admitting: Hematology and Oncology

## 2020-11-02 NOTE — Assessment & Plan Note (Signed)
09/28/2020:Screening mammogram showed a possible left breast mass. Diagnostic mammogram and US showed a 1.3cm left breast mass at the 11 o'clock position and no left axillary adenopathy. Biopsy showed invasive ductal carcinoma, grade 2, HER-2 equivocal by IHC, negative by FISH (ratio 1.26), ER/PR+ 90%, KI67 15%.  Treatment Plan: 1. Breast conserving surgery: Grade 2 IDC, ER 90%, PR 9%, Her 2 Neg, Ki 67: 15% 2. Oncotype DX testing to determine if chemotherapy would be of any benefit followed by 3. Adjuvant radiation therapy followed by (patient has a skin condition which she will discuss with radiation oncology regarding pros and cons of radiation) 4. Adjuvant antiestrogen therapy  Pathology counseling: I discussed the final pathology report of the patient provided  a copy of this report. I discussed the margins as well as lymph node surgeries. We also discussed the final staging along with previously performed ER/PR and HER-2/neu testing.    RTC based on Oncotype Dx test

## 2020-11-03 ENCOUNTER — Other Ambulatory Visit: Payer: Self-pay

## 2020-11-03 ENCOUNTER — Inpatient Hospital Stay: Payer: Medicare PPO | Admitting: Hematology and Oncology

## 2020-11-03 DIAGNOSIS — Z7982 Long term (current) use of aspirin: Secondary | ICD-10-CM | POA: Diagnosis not present

## 2020-11-03 DIAGNOSIS — C50212 Malignant neoplasm of upper-inner quadrant of left female breast: Secondary | ICD-10-CM

## 2020-11-03 DIAGNOSIS — Z170 Estrogen receptor positive status [ER+]: Secondary | ICD-10-CM | POA: Diagnosis not present

## 2020-11-03 DIAGNOSIS — Z79899 Other long term (current) drug therapy: Secondary | ICD-10-CM | POA: Diagnosis not present

## 2020-11-03 DIAGNOSIS — Z87891 Personal history of nicotine dependence: Secondary | ICD-10-CM | POA: Diagnosis not present

## 2020-11-03 DIAGNOSIS — Z8541 Personal history of malignant neoplasm of cervix uteri: Secondary | ICD-10-CM | POA: Diagnosis not present

## 2020-11-03 DIAGNOSIS — Z85828 Personal history of other malignant neoplasm of skin: Secondary | ICD-10-CM | POA: Diagnosis not present

## 2020-11-03 NOTE — Progress Notes (Signed)
Patient Care Team: Lujean Amel, MD as PCP - General (Family Medicine) [name], Wonda Cheng, MD as PCP - Cardiology (Cardiology)  DIAGNOSIS:    ICD-10-CM   1. Malignant neoplasm of upper-inner quadrant of left breast in female, estrogen receptor positive ([place])  C50.212    Z17.0     SUMMARY OF ONCOLOGIC HISTORY: Oncology History  Malignant neoplasm of upper-inner quadrant of left breast in female, estrogen receptor positive ([name])  09/20/2020 Cancer Staging   Staging form: Breast, AJCC 8th Edition - Clinical stage from 09/20/2020: Stage IA (cT1c, cN0, cM0, G2, ER+, PR+, HER2-) - Signed by Gardenia Phlegm, NP on 09/28/2020 Stage prefix: Initial diagnosis Histologic grading system: 3 grade system   09/28/2020 Initial Diagnosis   Screening mammogram showed a possible left breast mass. Diagnostic mammogram and US showed a 1.3cm left breast mass at the 11 o'clock position and no left axillary adenopathy. Biopsy showed invasive ductal carcinoma, grade 2, HER-2 equivocal by IHC, negative by FISH (ratio 1.26), ER/PR+ 90%, KI67 15%.    10/27/2020 Surgery   Left lumpectomy Ninfa Linden): IDC, 1.5cm, clear margins     CHIEF COMPLIANT: Follow-up s/p lumpectomy   INTERVAL HISTORY: Destiny Andersen is a 75 y.o. with above-mentioned history of left breast cancer. She underwent a left lumpectomy with Dr. Ninfa Linden on 10/27/20 for which pathology showed invasive ductal carcinoma, 1.5cm, clear margins. She presents to the clinic today to review the pathology report and discuss further treatment.   ALLERGIES:  is allergic to penicillins, codone [hydrocodone], codeine, doxycycline hyclate, and ultram [tramadol].  MEDICATIONS:  Current Outpatient Medications  Medication Sig Dispense Refill  . aspirin 81 MG tablet Take 81 mg by mouth daily.    . Biotin 1 MG CAPS Take 1 capsule by mouth daily. 30 capsule   . calcium citrate-vitamin D (CITRACAL+D) 315-200 MG-UNIT tablet Take 1 tablet by mouth 2 (two) times  daily. VITAMIN D DOSAGE IS 250MG INSTEAD OF 200    . clindamycin (CLEOCIN) 300 MG capsule TAKE 2 CAPSULES BY MOUTH AS DIRECTED 1 HOUR PRIOR TO DENTAL WORK 2 capsule 0  . clotrimazole-betamethasone (LOTRISONE) cream Apply 1 application topically as needed.    . metoprolol tartrate (LOPRESSOR) 25 MG tablet TAKE 1/2 TABLET BY MOUTH EVERY MORNING, AND 1 TABLET EVERY EVENING 135 tablet 3  . oxyCODONE (OXY IR/ROXICODONE) 5 MG immediate release tablet Take 1 tablet (5 mg total) by mouth every 6 (six) hours as needed for moderate pain or severe pain. 20 tablet 0   No current facility-administered medications for this visit.    PHYSICAL EXAMINATION: ECOG PERFORMANCE STATUS: 1 - Symptomatic but completely ambulatory  Vitals:   11/03/20 0948  BP: 122/76  Pulse: 80  Resp: 16  Temp: 98.1 F (36.7 C)  SpO2: 95%   Filed Weights   11/03/20 0948  Weight: 132 lb 14.4 oz (60.3 kg)    LABORATORY DATA:  I have reviewed the data as listed CMP Latest Ref Rng & Units 03/11/2020 01/29/2020 02/05/2016  Glucose 65 - 99 mg/dL 77 105(H) 102(H)  BUN 8 - 27 mg/dL '17 16 9  ' Creatinine 0.57 - 1.00 mg/dL 1.05(H) 1.12(H) 0.89  Sodium 134 - 144 mmol/L 138 132(L) 141  Potassium 3.5 - 5.2 mmol/L 4.4 4.3 4.4  Chloride 96 - 106 mmol/L 102 96 106  CO2 20 - 29 mmol/L '26 24 28  ' Calcium 8.7 - 10.3 mg/dL 9.9 9.6 9.0  Total Protein 6.5 - 8.1 g/dL - - -  Total Bilirubin 0.3 -  1.2 mg/dL - - -  Alkaline Phos 38 - 126 U/L - - -  AST 15 - 41 U/L - - -  ALT 14 - 54 U/L - - -    Lab Results  Component Value Date   WBC 6.2 01/29/2020   HGB 11.9 01/29/2020   HCT 33.7 (L) 01/29/2020   MCV 96 01/29/2020   PLT 273 01/29/2020   NEUTROABS 3.5 01/29/2020    ASSESSMENT & PLAN:  Malignant neoplasm of upper-inner quadrant of left breast in female, estrogen receptor positive ([name]) 09/28/2020:Screening mammogram showed a possible left breast mass. Diagnostic mammogram and US showed a 1.3cm left breast mass at the 11 o'clock  position and no left axillary adenopathy. Biopsy showed invasive ductal carcinoma, grade 2, HER-2 equivocal by IHC, negative by FISH (ratio 1.26), ER/PR+ 90%, KI67 15%.  Treatment Plan: 1. Breast conserving surgery: Grade 2 IDC, ER 90%, PR 9%, Her 2 Neg, Ki 67: 15% 2. Oncotype DX testing to determine if chemotherapy would be of any benefit followed by 3. Adjuvant radiation therapy followed by (patient has a skin condition which she will discuss with radiation oncology regarding pros and cons of radiation) 4. Adjuvant antiestrogen therapy  Pathology counseling: I discussed the final pathology report of the patient provided  a copy of this report. I discussed the margins as well as lymph node surgeries. We also discussed the final staging along with previously performed ER/PR and HER-2/neu testing.    RTC based on Oncotype Dx test    No orders of the defined types were placed in this encounter.  The patient has a good understanding of the overall plan. she agrees with it. she will call with any problems that may develop before the next visit here.  Total time spent: 30 mins including face to face time and time spent for planning, charting and coordination of care  Rulon Eisenmenger, MD, MPH 11/03/2020  I, Cloyde Reams Dorshimer, am acting as scribe for Dr. Nicholas Lose.  I have reviewed the above documentation for accuracy and completeness, and I agree with the above.

## 2020-11-10 ENCOUNTER — Encounter: Payer: Self-pay | Admitting: *Deleted

## 2020-11-10 ENCOUNTER — Telehealth: Payer: Self-pay | Admitting: *Deleted

## 2020-11-10 NOTE — Telephone Encounter (Signed)
Spoke to pt regarding expected release date of oncotype results is to be on 6/3. Received verbal understanding.

## 2020-11-16 DIAGNOSIS — C50212 Malignant neoplasm of upper-inner quadrant of left female breast: Secondary | ICD-10-CM | POA: Diagnosis not present

## 2020-11-16 DIAGNOSIS — Z170 Estrogen receptor positive status [ER+]: Secondary | ICD-10-CM | POA: Diagnosis not present

## 2020-11-18 ENCOUNTER — Telehealth: Payer: Self-pay | Admitting: *Deleted

## 2020-11-18 ENCOUNTER — Encounter: Payer: Self-pay | Admitting: *Deleted

## 2020-11-18 DIAGNOSIS — Z170 Estrogen receptor positive status [ER+]: Secondary | ICD-10-CM

## 2020-11-18 DIAGNOSIS — C50212 Malignant neoplasm of upper-inner quadrant of left female breast: Secondary | ICD-10-CM

## 2020-11-18 NOTE — Telephone Encounter (Signed)
Received oncotype results of 9/3%. Left message for a return phone call to let patient know results.Referral placed for Dr. Lisbeth Renshaw

## 2020-11-21 ENCOUNTER — Encounter: Payer: Self-pay | Admitting: *Deleted

## 2020-11-22 ENCOUNTER — Other Ambulatory Visit: Payer: Self-pay | Admitting: Cardiovascular Disease

## 2020-11-25 ENCOUNTER — Encounter (HOSPITAL_COMMUNITY): Payer: Self-pay

## 2020-11-28 NOTE — Progress Notes (Signed)
New Breast Cancer Diagnosis: Left Breast  Pathology Report: Left Breast 10/27/2020, Dr. Ninfa Linden  Receptor Status: ER(positive), PR (positive), Her2-neu (negative), Ki-(15%)    Family History of Breast/Ovarian/Prostate Cancer: Mother had breast cancer  Lymphedema issues, if any:  no   Pain issues, if any:  Tender/Sensitive to the touch at her incision site.  SAFETY ISSUES: Prior radiation? no Pacemaker/ICD? no Possible current pregnancy? Postmenopausal Is the patient on methotrexate? no  Current Complaints / other details:   Oncotype Score: 9/3%  Seeing Dermatology today, she has some spots on her legs.

## 2020-11-29 ENCOUNTER — Ambulatory Visit
Admission: RE | Admit: 2020-11-29 | Discharge: 2020-11-29 | Disposition: A | Discharge status: Home / Self Care | Payer: Medicare PPO | Source: Ambulatory Visit | Attending: Radiation Oncology | Admitting: Radiation Oncology

## 2020-11-29 ENCOUNTER — Encounter: Payer: Self-pay | Admitting: Radiation Oncology

## 2020-11-29 ENCOUNTER — Ambulatory Visit: Payer: Medicare PPO | Admitting: Radiation Oncology

## 2020-11-29 ENCOUNTER — Other Ambulatory Visit: Payer: Self-pay

## 2020-11-29 VITALS — BP 126/75 | HR 81 | Temp 97.8°F | Resp 18 | Ht 64.5 in | Wt 133.6 lb

## 2020-11-29 DIAGNOSIS — E875 Hyperkalemia: Secondary | ICD-10-CM | POA: Diagnosis not present

## 2020-11-29 DIAGNOSIS — C50212 Malignant neoplasm of upper-inner quadrant of left female breast: Secondary | ICD-10-CM | POA: Insufficient documentation

## 2020-11-29 DIAGNOSIS — D692 Other nonthrombocytopenic purpura: Secondary | ICD-10-CM | POA: Diagnosis not present

## 2020-11-29 DIAGNOSIS — Z79899 Other long term (current) drug therapy: Secondary | ICD-10-CM | POA: Insufficient documentation

## 2020-11-29 DIAGNOSIS — Z170 Estrogen receptor positive status [ER+]: Secondary | ICD-10-CM | POA: Diagnosis not present

## 2020-11-29 DIAGNOSIS — Z7982 Long term (current) use of aspirin: Secondary | ICD-10-CM | POA: Diagnosis not present

## 2020-11-29 DIAGNOSIS — Z87891 Personal history of nicotine dependence: Secondary | ICD-10-CM | POA: Diagnosis not present

## 2020-11-29 DIAGNOSIS — Z808 Family history of malignant neoplasm of other organs or systems: Secondary | ICD-10-CM | POA: Diagnosis not present

## 2020-11-29 DIAGNOSIS — Z8541 Personal history of malignant neoplasm of cervix uteri: Secondary | ICD-10-CM | POA: Insufficient documentation

## 2020-11-29 DIAGNOSIS — L111 Transient acantholytic dermatosis [Grover]: Secondary | ICD-10-CM | POA: Diagnosis not present

## 2020-11-29 DIAGNOSIS — R569 Unspecified convulsions: Secondary | ICD-10-CM | POA: Diagnosis not present

## 2020-11-29 DIAGNOSIS — Z803 Family history of malignant neoplasm of breast: Secondary | ICD-10-CM | POA: Diagnosis not present

## 2020-11-29 DIAGNOSIS — L821 Other seborrheic keratosis: Secondary | ICD-10-CM | POA: Diagnosis not present

## 2020-11-29 DIAGNOSIS — I341 Nonrheumatic mitral (valve) prolapse: Secondary | ICD-10-CM | POA: Insufficient documentation

## 2020-11-29 DIAGNOSIS — Z85828 Personal history of other malignant neoplasm of skin: Secondary | ICD-10-CM | POA: Diagnosis not present

## 2020-11-29 NOTE — Progress Notes (Signed)
Radiation Oncology         (336) [phone] ________________________________  Name: Destiny Andersen        MRN: [id number]  Date of Service: 11/29/2020 DOB: 10-Apr-1946  WC:HENIDPO, Dibas, MD  Nicholas Lose, MD     REFERRING PHYSICIAN: Nicholas Lose, MD   DIAGNOSIS: The encounter diagnosis was Malignant neoplasm of upper-inner quadrant of left breast in female, estrogen receptor positive ([place]).   HISTORY OF PRESENT ILLNESS: Destiny Andersen is a 75 y.o. female with a new diagnosis of left breast cancer. The patient was noted to have a screening detected mass in the left breast, further diagnostic work-up revealed a 1.3 cm mass in the 11 o'clock position.  She did not have any adenopathy. She underwent a biopsy on 09/20/2020 which revealed a grade 2 invasive ductal carcinoma that was ER/PR positive, HER2 was negative by FISH, and her Ki-67 was 15%.  She has met with Dr. Ninfa Linden and plans to undergo left lumpectomy which is scheduled for 10/27/2020.  Of note she has a chronic skin condition called Grover's disease that Dr. Ronnald Ramp in dermatology diagnosed clinically in the spring of 2022. Dr. Lisbeth Renshaw does not anticipate that this would interfere with her ability to receive radiotherapy.   Since her last visit, she has undergone left lumpectomy on 10/27/2020, final pathology revealed a grade 2 invasive ductal carcinoma that measured 1.5 cm.  Her closest margin was less than 1 mm to the posterior resection site.  No nodal tissue is submitted.  An Oncotype score was ordered and resulted with a score of 9, no chemotherapy is recommended.  She is seen today to discuss moving forward with adjuvant radiotherapy.      PREVIOUS RADIATION THERAPY: No   PAST MEDICAL HISTORY:  Past Medical History:  Diagnosis Date   Breast cancer ([place]) 09/20/2020   Cancer ([place]) 1983   Cervical cancer.  Skin Cancer- leg   Chronic headaches    "? Migraines"- does not have then any more   Constipation    Elevated C-reactive  protein (CRP)    Family history of breast cancer    Mother   Fatigue    Grover's disease 2022   Grover's disease    Heart murmur    ECHO 02/10/13 EF = 60-65%, moderate posterior mitral valve prolapse w/possible flail segment, severe MR, mitral regurgitant jet is anteriorly directed.   History of blood transfusion    pre-eclampsia    Hyperpotassemia    Migrated   Medicare annual wellness visit, subsequent    Mitral valve prolapse    MR (mitral regurgitation)    ECHO 02/10/13 EF = 60-65%, moderate posterior mitral valve prolapse w/possible flail segment, severe MR, mitral regurgitant jet is anteriorly directed.   Risk for falls    Routine general medical examination at a health care facility    S/P minimally invasive mitral valve repair 02/01/2016   Complex valvuloplasty including triangular resection of flail posterior leaflet, artificial Gore-tex neochord placement x10 and 28 mm Sorin Memo 3D ring annuloplasty via right mini thoracotomy approach   Seizures ([place])    with pre- echa   Shortness of breath dyspnea    with exertion       PAST SURGICAL HISTORY: Past Surgical History:  Procedure Laterality Date   BREAST BIOPSY Right 1998   benign cyst   BREAST LUMPECTOMY WITH RADIOACTIVE SEED LOCALIZATION Left 10/27/2020   Procedure: LEFT BREAST LUMPECTOMY WITH RADIOACTIVE SEED LOCALIZATION;  Surgeon: Coralie Keens, MD;  Location: [facility];  Service: General;  Laterality: Left;   CARDIAC CATHETERIZATION N/A 12/21/2015   Procedure: Right/Left Heart Cath and Coronary Angiography;  Surgeon: Sherren Mocha, MD;  Location: [facility];  Service: Cardiovascular;  Laterality: N/A;   COLONOSCOPY  08/2001   Colon cancer screening in 01/2012   LAPAROSCOPIC CHOLECYSTECTOMY  1990   left knee arthroscopic surgery Left 05/29/2018   MITRAL VALVE REPAIR Right 02/01/2016   Procedure: MINIMALLY INVASIVE MITRAL VALVE REPAIR (MVR) with closure of PFO;  Surgeon: Rexene Alberts, MD;   Location: [facility];  Service: Open Heart Surgery;  Laterality: Right;   TEE WITHOUT CARDIOVERSION N/A 10/14/2015   Procedure: TRANSESOPHAGEAL ECHOCARDIOGRAM (TEE);  Surgeon: Thayer Headings, MD;  Location: [facility];  Service: Cardiovascular;  Laterality: N/A;   TEE WITHOUT CARDIOVERSION N/A 02/01/2016   Procedure: TRANSESOPHAGEAL ECHOCARDIOGRAM (TEE);  Surgeon: Rexene Alberts, MD;  Location: [facility];  Service: Open Heart Surgery;  Laterality: N/A;   TOTAL ABDOMINAL HYSTERECTOMY  1983   Cervical cancer 1983     FAMILY HISTORY:  Family History  Problem Relation Age of Onset   Hypertension Mother    Heart disease Mother    CAD Mother    Breast cancer Mother 35   AAA (abdominal aortic aneurysm) Father    CAD Father    Hypertension Father    Heart disease Father    High Cholesterol Brother    Hypertension Brother    Skin cancer Brother      SOCIAL HISTORY:  reports that she quit smoking about 35 years ago. Her smoking use included cigarettes. She has never used smokeless tobacco. She reports previous alcohol use. She reports that she does not use drugs. The patient is married and lives in [place]. 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.    ALLERGIES: Penicillins, Codone [hydrocodone], Codeine, Doxycycline hyclate, and Ultram [tramadol]   MEDICATIONS:  Current Outpatient Medications  Medication Sig Dispense Refill   aspirin 81 MG tablet Take 81 mg by mouth daily.     Biotin 1 MG CAPS Take 1 capsule by mouth daily. 30 capsule    calcium citrate-vitamin D (CITRACAL+D) 315-200 MG-UNIT tablet Take 1 tablet by mouth 2 (two) times daily. VITAMIN D DOSAGE IS 250MG INSTEAD OF 200     clindamycin (CLEOCIN) 300 MG capsule TAKE 2 CAPSULES BY MOUTH AS DIRECTED 1 HOUR PRIOR TO DENTAL WORK 2 capsule 0   clotrimazole-betamethasone (LOTRISONE) cream Apply 1 application topically as needed.     metoprolol tartrate (LOPRESSOR) 25 MG tablet TAKE 1/2 TABLET  BY MOUTH EVERY MORNING, AND 1 TABLET EVERY EVENING 135 tablet 0   No current facility-administered medications for this encounter.     REVIEW OF SYSTEMS: On review of systems, the patient reports that she is doing well overall. She feels that she's healing nicely. She is having some new areas on her left leg that she's having evaluated today with Dr. Ronnald Ramp this afternoon. She reports she's started drinking cilantro smoothies and has noticed improvement in her skin condition as a result. No other complaints are verbalized.    PHYSICAL EXAM:  Wt Readings from Last 3 Encounters:  11/03/20 132 lb 14.4 oz (60.3 kg)  10/27/20 133 lb 13.1 oz (60.7 kg)  10/20/20 133 lb 2 oz (60.4 kg)   Temp Readings from Last 3 Encounters:  11/03/20 98.1 F (36.7 C) (Temporal)  10/27/20 (!) 97.5 F (36.4 C)  10/20/20 Marland Kitchen)  97.4 F (36.3 C) (Temporal)   BP Readings from Last 3 Encounters:  11/03/20 122/76  10/27/20 125/70  10/20/20 117/76   Pulse Readings from Last 3 Encounters:  11/03/20 80  10/27/20 67  10/20/20 75    In general this is a well appearing caucasian female in no acute distress. She's alert and oriented x4 and appropriate throughout the examination. Cardiopulmonary assessment is negative for acute distress and she exhibits normal effort. The left breast incision site is visible from the neckline of her shirt and is well healed with induration but no separation, bleeding or fluctuance.     ECOG = 0  0 - Asymptomatic (Fully active, able to carry on all predisease activities without restriction)  1 - Symptomatic but completely ambulatory (Restricted in physically strenuous activity but ambulatory and able to carry out work of a light or sedentary nature. For example, light housework, office work)  2 - Symptomatic, <50% in bed during the day (Ambulatory and capable of all self care but unable to carry out any work activities. Up and about more than 50% of waking hours)  3 - Symptomatic,  >50% in bed, but not bedbound (Capable of only limited self-care, confined to bed or chair 50% or more of waking hours)  4 - Bedbound (Completely disabled. Cannot carry on any self-care. Totally confined to bed or chair)  5 - Death   Eustace Pen MM, Creech RH, Tormey DC, et al. [phone]). "Toxicity and response criteria of the [facility]". [place] Oncol. 5 (6): 649-55    LABORATORY DATA:  Lab Results  Component Value Date   WBC 6.2 01/29/2020   HGB 11.9 01/29/2020   HCT 33.7 (L) 01/29/2020   MCV 96 01/29/2020   PLT 273 01/29/2020   Lab Results  Component Value Date   NA 138 03/11/2020   K 4.4 03/11/2020   CL 102 03/11/2020   CO2 26 03/11/2020   Lab Results  Component Value Date   ALT 17 01/30/2016   AST 26 01/30/2016   ALKPHOS 42 01/30/2016   BILITOT 0.4 01/30/2016      RADIOGRAPHY: No results found.     IMPRESSION/PLAN: 1. Stage IA, pT1c, cN0M0 grade 2, ER/PR positive invasive ductal carcinoma of the left breast. Dr. Lisbeth Renshaw discusses the pathology findings and reviews the nature of early stage left breast disease.  The patient has been doing well since surgery and is healed well enough to proceed with external radiotherapy to the breast  to reduce risks of local recurrence followed by antiestrogen therapy. We discussed the risks, benefits, short, and long term effects of radiotherapy, as well as the curative intent, and the patient is interested in proceeding. Dr. Lisbeth Renshaw discusses the delivery and logistics of radiotherapy and recommends 4 weeks of radiotherapy with deep inspiration breath hold technique to the left breast. Written consent is obtained and placed in the chart, a copy was provided to the patient. Unfortunately our initial plans for simulation will be postponed to later this week due to a software issue. She is disappointed but is in agreement to let our staff contact her to reschedule this. 2. Grover's Disease as it pertains to #1. Dr. Lisbeth Renshaw  again discusses the limitations in few studies and case reports in the literature, however it does appear that some patients have been treated with radiotherapy for their skin condition. There does not appear to be any known or established scenarios that prohibit the use of radiotherapy in patients with Grover's  Disease. Again, our plan would be that  her skin will be closely watched during treatment and this will be considered at weekly intervals if her skin changes are more prominent than what is typically seen from radiotherapy.  In a visit lasting 45 minutes, greater than 50% of the time was spent face to face reviewing her case, as well as in preparation of, discussing, and coordinating the patient's care.  The above documentation reflects my direct findings during this shared patient visit. Please see the separate note by Dr. Lisbeth Renshaw on this date for the remainder of the patient's plan of care.    Carola Rhine, [facility]    **Disclaimer: This note was dictated with voice recognition software. Similar sounding words can inadvertently be transcribed and this note may contain transcription errors which may not have been corrected upon publication of note.**

## 2020-12-01 ENCOUNTER — Encounter: Payer: Self-pay | Admitting: *Deleted

## 2020-12-02 ENCOUNTER — Encounter: Payer: Self-pay | Admitting: Hematology and Oncology

## 2020-12-02 ENCOUNTER — Encounter: Payer: Self-pay | Admitting: *Deleted

## 2020-12-05 ENCOUNTER — Other Ambulatory Visit: Payer: Self-pay

## 2020-12-05 ENCOUNTER — Ambulatory Visit
Admission: RE | Admit: 2020-12-05 | Discharge: 2020-12-05 | Disposition: A | Discharge status: Home / Self Care | Payer: Medicare PPO | Source: Ambulatory Visit | Attending: Radiation Oncology | Admitting: Radiation Oncology

## 2020-12-05 DIAGNOSIS — Z87891 Personal history of nicotine dependence: Secondary | ICD-10-CM | POA: Diagnosis not present

## 2020-12-05 DIAGNOSIS — Z170 Estrogen receptor positive status [ER+]: Secondary | ICD-10-CM | POA: Insufficient documentation

## 2020-12-05 DIAGNOSIS — C50212 Malignant neoplasm of upper-inner quadrant of left female breast: Secondary | ICD-10-CM | POA: Diagnosis not present

## 2020-12-05 DIAGNOSIS — Z510 Encounter for antineoplastic radiation therapy: Secondary | ICD-10-CM | POA: Diagnosis not present

## 2020-12-07 DIAGNOSIS — C50212 Malignant neoplasm of upper-inner quadrant of left female breast: Secondary | ICD-10-CM | POA: Diagnosis not present

## 2020-12-07 DIAGNOSIS — Z510 Encounter for antineoplastic radiation therapy: Secondary | ICD-10-CM | POA: Diagnosis not present

## 2020-12-07 DIAGNOSIS — Z170 Estrogen receptor positive status [ER+]: Secondary | ICD-10-CM | POA: Diagnosis not present

## 2020-12-07 DIAGNOSIS — Z87891 Personal history of nicotine dependence: Secondary | ICD-10-CM | POA: Diagnosis not present

## 2020-12-08 ENCOUNTER — Encounter: Payer: Self-pay | Admitting: *Deleted

## 2020-12-08 ENCOUNTER — Telehealth: Payer: Self-pay | Admitting: Hematology and Oncology

## 2020-12-08 NOTE — Telephone Encounter (Signed)
Scheduled appointment per 06/23 sch msg. Left message.

## 2020-12-14 ENCOUNTER — Other Ambulatory Visit: Payer: Self-pay

## 2020-12-14 ENCOUNTER — Ambulatory Visit
Admission: RE | Admit: 2020-12-14 | Discharge: 2020-12-14 | Disposition: A | Discharge status: Home / Self Care | Payer: Medicare PPO | Source: Ambulatory Visit | Attending: Radiation Oncology | Admitting: Radiation Oncology

## 2020-12-14 DIAGNOSIS — C50212 Malignant neoplasm of upper-inner quadrant of left female breast: Secondary | ICD-10-CM | POA: Diagnosis not present

## 2020-12-14 DIAGNOSIS — Z87891 Personal history of nicotine dependence: Secondary | ICD-10-CM | POA: Diagnosis not present

## 2020-12-14 DIAGNOSIS — Z510 Encounter for antineoplastic radiation therapy: Secondary | ICD-10-CM | POA: Diagnosis not present

## 2020-12-14 DIAGNOSIS — Z170 Estrogen receptor positive status [ER+]: Secondary | ICD-10-CM | POA: Diagnosis not present

## 2020-12-15 ENCOUNTER — Ambulatory Visit
Admission: RE | Admit: 2020-12-15 | Discharge: 2020-12-15 | Disposition: A | Discharge status: Home / Self Care | Payer: Medicare PPO | Source: Ambulatory Visit | Attending: Radiation Oncology | Admitting: Radiation Oncology

## 2020-12-15 DIAGNOSIS — Z87891 Personal history of nicotine dependence: Secondary | ICD-10-CM | POA: Diagnosis not present

## 2020-12-15 DIAGNOSIS — Z170 Estrogen receptor positive status [ER+]: Secondary | ICD-10-CM | POA: Diagnosis not present

## 2020-12-15 DIAGNOSIS — C50212 Malignant neoplasm of upper-inner quadrant of left female breast: Secondary | ICD-10-CM | POA: Diagnosis not present

## 2020-12-15 DIAGNOSIS — Z510 Encounter for antineoplastic radiation therapy: Secondary | ICD-10-CM | POA: Diagnosis not present

## 2020-12-15 NOTE — Progress Notes (Signed)

## 2020-12-16 ENCOUNTER — Ambulatory Visit
Admission: RE | Admit: 2020-12-16 | Discharge: 2020-12-16 | Disposition: A | Discharge status: Home / Self Care | Payer: Medicare PPO | Source: Ambulatory Visit | Attending: Radiation Oncology | Admitting: Radiation Oncology

## 2020-12-16 DIAGNOSIS — C50212 Malignant neoplasm of upper-inner quadrant of left female breast: Secondary | ICD-10-CM | POA: Insufficient documentation

## 2020-12-16 DIAGNOSIS — Z510 Encounter for antineoplastic radiation therapy: Secondary | ICD-10-CM | POA: Insufficient documentation

## 2020-12-16 DIAGNOSIS — Z170 Estrogen receptor positive status [ER+]: Secondary | ICD-10-CM | POA: Diagnosis not present

## 2020-12-16 MED ORDER — RADIAPLEXRX EX GEL: Freq: Once | CUTANEOUS | Status: AC

## 2020-12-16 MED ORDER — ALRA NON-METALLIC DEODORANT (RAD-ONC)
1.0000 | Freq: Once | TOPICAL | Status: AC
Start: 2020-12-16 — End: 2020-12-16
  Administered 2020-12-16: 1 via TOPICAL

## 2020-12-20 ENCOUNTER — Other Ambulatory Visit: Payer: Self-pay

## 2020-12-20 ENCOUNTER — Ambulatory Visit
Admission: RE | Admit: 2020-12-20 | Discharge: 2020-12-20 | Disposition: A | Discharge status: Home / Self Care | Payer: Medicare PPO | Source: Ambulatory Visit | Attending: Radiation Oncology | Admitting: Radiation Oncology

## 2020-12-20 DIAGNOSIS — C50212 Malignant neoplasm of upper-inner quadrant of left female breast: Secondary | ICD-10-CM | POA: Diagnosis not present

## 2020-12-20 DIAGNOSIS — Z170 Estrogen receptor positive status [ER+]: Secondary | ICD-10-CM | POA: Diagnosis not present

## 2020-12-20 DIAGNOSIS — Z510 Encounter for antineoplastic radiation therapy: Secondary | ICD-10-CM | POA: Diagnosis not present

## 2020-12-21 ENCOUNTER — Ambulatory Visit
Admission: RE | Admit: 2020-12-21 | Discharge: 2020-12-21 | Disposition: A | Discharge status: Home / Self Care | Payer: Medicare PPO | Source: Ambulatory Visit | Attending: Radiation Oncology | Admitting: Radiation Oncology

## 2020-12-21 ENCOUNTER — Encounter: Payer: Self-pay | Admitting: Radiation Oncology

## 2020-12-21 DIAGNOSIS — Z170 Estrogen receptor positive status [ER+]: Secondary | ICD-10-CM | POA: Diagnosis not present

## 2020-12-21 DIAGNOSIS — Z510 Encounter for antineoplastic radiation therapy: Secondary | ICD-10-CM | POA: Diagnosis not present

## 2020-12-21 DIAGNOSIS — C50212 Malignant neoplasm of upper-inner quadrant of left female breast: Secondary | ICD-10-CM | POA: Diagnosis not present

## 2020-12-22 ENCOUNTER — Other Ambulatory Visit: Payer: Self-pay

## 2020-12-22 ENCOUNTER — Ambulatory Visit
Admission: RE | Admit: 2020-12-22 | Discharge: 2020-12-22 | Disposition: A | Discharge status: Home / Self Care | Payer: Medicare PPO | Source: Ambulatory Visit | Attending: Radiation Oncology | Admitting: Radiation Oncology

## 2020-12-22 DIAGNOSIS — C50212 Malignant neoplasm of upper-inner quadrant of left female breast: Secondary | ICD-10-CM | POA: Diagnosis not present

## 2020-12-22 DIAGNOSIS — Z510 Encounter for antineoplastic radiation therapy: Secondary | ICD-10-CM | POA: Diagnosis not present

## 2020-12-22 DIAGNOSIS — Z170 Estrogen receptor positive status [ER+]: Secondary | ICD-10-CM | POA: Diagnosis not present

## 2020-12-23 ENCOUNTER — Ambulatory Visit
Admission: RE | Admit: 2020-12-23 | Discharge: 2020-12-23 | Disposition: A | Discharge status: Home / Self Care | Payer: Medicare PPO | Source: Ambulatory Visit | Attending: Radiation Oncology | Admitting: Radiation Oncology

## 2020-12-23 DIAGNOSIS — Z510 Encounter for antineoplastic radiation therapy: Secondary | ICD-10-CM | POA: Diagnosis not present

## 2020-12-23 DIAGNOSIS — C50212 Malignant neoplasm of upper-inner quadrant of left female breast: Secondary | ICD-10-CM | POA: Diagnosis not present

## 2020-12-23 DIAGNOSIS — Z170 Estrogen receptor positive status [ER+]: Secondary | ICD-10-CM | POA: Diagnosis not present

## 2020-12-25 DIAGNOSIS — C50212 Malignant neoplasm of upper-inner quadrant of left female breast: Secondary | ICD-10-CM | POA: Diagnosis not present

## 2020-12-25 DIAGNOSIS — Z510 Encounter for antineoplastic radiation therapy: Secondary | ICD-10-CM | POA: Diagnosis not present

## 2020-12-25 DIAGNOSIS — Z170 Estrogen receptor positive status [ER+]: Secondary | ICD-10-CM | POA: Diagnosis not present

## 2020-12-26 ENCOUNTER — Ambulatory Visit
Admission: RE | Admit: 2020-12-26 | Discharge: 2020-12-26 | Disposition: A | Discharge status: Home / Self Care | Payer: Medicare PPO | Source: Ambulatory Visit | Attending: Radiation Oncology | Admitting: Radiation Oncology

## 2020-12-26 ENCOUNTER — Other Ambulatory Visit: Payer: Self-pay

## 2020-12-26 DIAGNOSIS — Z170 Estrogen receptor positive status [ER+]: Secondary | ICD-10-CM | POA: Diagnosis not present

## 2020-12-26 DIAGNOSIS — Z510 Encounter for antineoplastic radiation therapy: Secondary | ICD-10-CM | POA: Diagnosis not present

## 2020-12-26 DIAGNOSIS — C50212 Malignant neoplasm of upper-inner quadrant of left female breast: Secondary | ICD-10-CM | POA: Diagnosis not present

## 2020-12-27 ENCOUNTER — Ambulatory Visit
Admission: RE | Admit: 2020-12-27 | Discharge: 2020-12-27 | Disposition: A | Discharge status: Home / Self Care | Payer: Medicare PPO | Source: Ambulatory Visit | Attending: Radiation Oncology | Admitting: Radiation Oncology

## 2020-12-27 DIAGNOSIS — Z170 Estrogen receptor positive status [ER+]: Secondary | ICD-10-CM | POA: Diagnosis not present

## 2020-12-27 DIAGNOSIS — C50212 Malignant neoplasm of upper-inner quadrant of left female breast: Secondary | ICD-10-CM | POA: Diagnosis not present

## 2020-12-27 DIAGNOSIS — Z510 Encounter for antineoplastic radiation therapy: Secondary | ICD-10-CM | POA: Diagnosis not present

## 2020-12-28 ENCOUNTER — Ambulatory Visit: Payer: Medicare PPO

## 2020-12-29 ENCOUNTER — Ambulatory Visit
Admission: RE | Admit: 2020-12-29 | Discharge: 2020-12-29 | Disposition: A | Discharge status: Home / Self Care | Payer: Medicare PPO | Source: Ambulatory Visit | Attending: Radiation Oncology | Admitting: Radiation Oncology

## 2020-12-29 ENCOUNTER — Other Ambulatory Visit: Payer: Self-pay

## 2020-12-29 DIAGNOSIS — Z170 Estrogen receptor positive status [ER+]: Secondary | ICD-10-CM | POA: Diagnosis not present

## 2020-12-29 DIAGNOSIS — C50212 Malignant neoplasm of upper-inner quadrant of left female breast: Secondary | ICD-10-CM | POA: Diagnosis not present

## 2020-12-29 DIAGNOSIS — Z510 Encounter for antineoplastic radiation therapy: Secondary | ICD-10-CM | POA: Diagnosis not present

## 2020-12-30 ENCOUNTER — Ambulatory Visit: Payer: Medicare PPO | Admitting: Radiation Oncology

## 2020-12-30 ENCOUNTER — Ambulatory Visit
Admission: RE | Admit: 2020-12-30 | Discharge: 2020-12-30 | Disposition: A | Discharge status: Home / Self Care | Payer: Medicare PPO | Source: Ambulatory Visit | Attending: Radiation Oncology | Admitting: Radiation Oncology

## 2020-12-30 DIAGNOSIS — Z170 Estrogen receptor positive status [ER+]: Secondary | ICD-10-CM | POA: Diagnosis not present

## 2020-12-30 DIAGNOSIS — Z510 Encounter for antineoplastic radiation therapy: Secondary | ICD-10-CM | POA: Diagnosis not present

## 2020-12-30 DIAGNOSIS — C50212 Malignant neoplasm of upper-inner quadrant of left female breast: Secondary | ICD-10-CM | POA: Diagnosis not present

## 2020-12-30 NOTE — Progress Notes (Signed)
  Radiation Oncology         [phone]) [phone] ________________________________  Name: CERRIA RANDHAWA MRN: [id number]  Date: 12/21/2020  DOB: Oct 03, 1945  SIMULATION NOTE   NARRATIVE:  The patient underwent simulation today for ongoing radiation therapy.  The existing CT study set was employed for the purpose of virtual treatment planning.  The target and avoidance structures were reviewed and modified as necessary.  Treatment planning then occurred.  The radiation boost prescription was entered and confirmed.  A total of 1 complex treatment devices were fabricated in the form of an en face electron block/ field. I have requested : Isodose Plan.    PLAN:  This modified radiation beam arrangement is intended to continue the current radiation dose to an additional 8 Gy in 4 fractions for a total cumulative dose of 50.56 Gy.    ------------------------------------------------  Jodelle Gross, MD, PhD

## 2021-01-01 NOTE — Progress Notes (Signed)
Patient Care Team: Lujean Amel, MD as PCP - General (Family Medicine) [name], Wonda Cheng, MD as PCP - Cardiology (Cardiology)  DIAGNOSIS:    ICD-10-CM   1. Malignant neoplasm of upper-inner quadrant of left breast in female, estrogen receptor positive ([place])  C50.212    Z17.0       SUMMARY OF ONCOLOGIC HISTORY: Oncology History  Malignant neoplasm of upper-inner quadrant of left breast in female, estrogen receptor positive ([place])  09/20/2020 Cancer Staging   Staging form: Breast, AJCC 8th Edition - Clinical stage from 09/20/2020: Stage IA (cT1c, cN0, cM0, G2, ER+, PR+, HER2-) - Signed by Gardenia Phlegm, NP on 09/28/2020  Stage prefix: Initial diagnosis  Histologic grading system: 3 grade system    09/28/2020 Initial Diagnosis   Screening mammogram showed a possible left breast mass. Diagnostic mammogram and US showed a 1.3cm left breast mass at the 11 o'clock position and no left axillary adenopathy. Biopsy showed invasive ductal carcinoma, grade 2, HER-2 equivocal by IHC, negative by FISH (ratio 1.26), ER/PR+ 90%, KI67 15%.    10/27/2020 Surgery   Left lumpectomy Ninfa Linden): IDC, 1.5cm, clear margins   10/27/2020 Oncotype testing   Score: 9 (ROR 3%)   12/15/2020 - 01/12/2021 Radiation Therapy   Adj XRT     CHIEF COMPLIANT: Follow-up of left breast cancer  INTERVAL HISTORY: Destiny Andersen is a 75 y.o. with above-mentioned history of left breast cancer having undergone a lumpectomy. She reports to the clinic today for follow-up.  She is here finishing with radiation and is here to discuss the pros and cons of adjuvant antiestrogen therapy.  ALLERGIES:  is allergic to penicillins, codone [hydrocodone], codeine, doxycycline hyclate, and ultram [tramadol].  MEDICATIONS:  Current Outpatient Medications  Medication Sig Dispense Refill   aspirin 81 MG tablet Take 81 mg by mouth daily.     Biotin 1 MG CAPS Take 1 capsule by mouth daily. 30 capsule    calcium  citrate-vitamin D (CITRACAL+D) 315-200 MG-UNIT tablet Take 1 tablet by mouth 2 (two) times daily. VITAMIN D DOSAGE IS 250MG INSTEAD OF 200     clindamycin (CLEOCIN) 300 MG capsule TAKE 2 CAPSULES BY MOUTH AS DIRECTED 1 HOUR PRIOR TO DENTAL WORK 2 capsule 0   clotrimazole-betamethasone (LOTRISONE) cream Apply 1 application topically as needed.     metoprolol tartrate (LOPRESSOR) 25 MG tablet TAKE 1/2 TABLET BY MOUTH EVERY MORNING, AND 1 TABLET EVERY EVENING 135 tablet 0   No current facility-administered medications for this visit.    PHYSICAL EXAMINATION: ECOG PERFORMANCE STATUS: 1 - Symptomatic but completely ambulatory  Vitals:   01/02/21 1105  BP: 137/70  Pulse: 80  Resp: 18  Temp: 98.1 F (36.7 C)  SpO2: 99%   Filed Weights   01/02/21 1105  Weight: 133 lb 4.8 oz (60.5 kg)       LABORATORY DATA:  I have reviewed the data as listed CMP Latest Ref Rng & Units 03/11/2020 01/29/2020 02/05/2016  Glucose 65 - 99 mg/dL 77 105(H) 102(H)  BUN 8 - 27 mg/dL '17 16 9  ' Creatinine 0.57 - 1.00 mg/dL 1.05(H) 1.12(H) 0.89  Sodium 134 - 144 mmol/L 138 132(L) 141  Potassium 3.5 - 5.2 mmol/L 4.4 4.3 4.4  Chloride 96 - 106 mmol/L 102 96 106  CO2 20 - 29 mmol/L '26 24 28  ' Calcium 8.7 - 10.3 mg/dL 9.9 9.6 9.0  Total Protein 6.5 - 8.1 g/dL - - -  Total Bilirubin 0.3 - 1.2 mg/dL - - -  Alkaline Phos 38 - 126 U/L - - -  AST 15 - 41 U/L - - -  ALT 14 - 54 U/L - - -    Lab Results  Component Value Date   WBC 6.2 01/29/2020   HGB 11.9 01/29/2020   HCT 33.7 (L) 01/29/2020   MCV 96 01/29/2020   PLT 273 01/29/2020   NEUTROABS 3.5 01/29/2020    ASSESSMENT & PLAN:  Malignant neoplasm of upper-inner quadrant of left breast in female, estrogen receptor positive ([place]) 09/28/2020:Screening mammogram showed a possible left breast mass. Diagnostic mammogram and US showed a 1.3cm left breast mass at the 11 o'clock position and no left axillary adenopathy. Biopsy showed invasive ductal carcinoma, grade 2,  HER-2 equivocal by IHC, negative by FISH (ratio 1.26), ER/PR+ 90%, KI67 15%.   Treatment Plan: 1. 10/27/20 Breast conserving surgery: Grade 2 IDC, ER 90%, PR 9%, Her 2 Neg, Ki 67: 15% 2. Oncotype DX testing: Score 9 (3% ROR) 3. Adjuvant radiation therapy to complete 01/12/21 4. Adjuvant antiestrogen therapy with letrozole (refused) ------------------------------------------------------------------------------------------------------------------  Letrozole counseling: We discussed the risks and benefits of anti-estrogen therapy with aromatase inhibitors. These include but not limited to insomnia, hot flashes, mood changes, vaginal dryness, bone density loss, and weight gain. We strongly believe that the benefits far outweigh the risks.    After discussing risks and benefits of antiestrogen therapy, she decided that she does not want to take it.  RTC in 3 months for SCP visit   No orders of the defined types were placed in this encounter.  The patient has a good understanding of the overall plan. she agrees with it. she will call with any problems that may develop before the next visit here.  Total time spent: 20 mins including face to face time and time spent for planning, charting and coordination of care  Rulon Eisenmenger, MD, MPH 01/02/2021  I, Thana Ates, am acting as scribe for Dr. Nicholas Lose.  I have reviewed the above documentation for accuracy and completeness, and I agree with the above.

## 2021-01-02 ENCOUNTER — Inpatient Hospital Stay: Payer: Medicare PPO | Attending: Hematology and Oncology | Admitting: Hematology and Oncology

## 2021-01-02 ENCOUNTER — Other Ambulatory Visit: Payer: Self-pay

## 2021-01-02 ENCOUNTER — Ambulatory Visit
Admission: RE | Admit: 2021-01-02 | Discharge: 2021-01-02 | Disposition: A | Discharge status: Home / Self Care | Payer: Medicare PPO | Source: Ambulatory Visit | Attending: Radiation Oncology | Admitting: Radiation Oncology

## 2021-01-02 DIAGNOSIS — Z79899 Other long term (current) drug therapy: Secondary | ICD-10-CM | POA: Insufficient documentation

## 2021-01-02 DIAGNOSIS — Z510 Encounter for antineoplastic radiation therapy: Secondary | ICD-10-CM | POA: Diagnosis not present

## 2021-01-02 DIAGNOSIS — Z923 Personal history of irradiation: Secondary | ICD-10-CM | POA: Diagnosis not present

## 2021-01-02 DIAGNOSIS — Z170 Estrogen receptor positive status [ER+]: Secondary | ICD-10-CM | POA: Diagnosis not present

## 2021-01-02 DIAGNOSIS — Z7982 Long term (current) use of aspirin: Secondary | ICD-10-CM | POA: Insufficient documentation

## 2021-01-02 DIAGNOSIS — C50212 Malignant neoplasm of upper-inner quadrant of left female breast: Secondary | ICD-10-CM | POA: Insufficient documentation

## 2021-01-02 NOTE — Assessment & Plan Note (Signed)
09/28/2020:Screening mammogram showed a possible left breast mass. Diagnostic mammogram and US showed a 1.3cm left breast mass at the 11 o'clock position and no left axillary adenopathy. Biopsy showed invasive ductal carcinoma, grade 2, HER-2 equivocal by IHC, negative by FISH (ratio 1.26), ER/PR+ 90%, KI67 15%.  Treatment Plan: 1. 10/27/20 Breast conserving surgery: Grade 2 IDC, ER 90%, PR 9%, Her 2 Neg, Ki 67: 15% 2. Oncotype DX testing: Score 9 (3% ROR) 3. Adjuvant radiation therapy to complete 01/12/21 4. Adjuvant antiestrogen therapy with letrozole to start 01/30/21 ------------------------------------------------------------------------------------------------------------------  Letrozole counseling: We discussed the risks and benefits of anti-estrogen therapy with aromatase inhibitors. These include but not limited to insomnia, hot flashes, mood changes, vaginal dryness, bone density loss, and weight gain. We strongly believe that the benefits far outweigh the risks. Patient understands these risks and consented to starting treatment. Planned treatment duration is 5-7 years.  RTC in 3 months for SCP visit

## 2021-01-03 ENCOUNTER — Ambulatory Visit
Admission: RE | Admit: 2021-01-03 | Discharge: 2021-01-03 | Disposition: A | Discharge status: Home / Self Care | Payer: Medicare PPO | Source: Ambulatory Visit | Attending: Radiation Oncology | Admitting: Radiation Oncology

## 2021-01-03 DIAGNOSIS — C50212 Malignant neoplasm of upper-inner quadrant of left female breast: Secondary | ICD-10-CM | POA: Diagnosis not present

## 2021-01-03 DIAGNOSIS — Z510 Encounter for antineoplastic radiation therapy: Secondary | ICD-10-CM | POA: Diagnosis not present

## 2021-01-03 DIAGNOSIS — Z170 Estrogen receptor positive status [ER+]: Secondary | ICD-10-CM | POA: Diagnosis not present

## 2021-01-04 ENCOUNTER — Ambulatory Visit
Admission: RE | Admit: 2021-01-04 | Discharge: 2021-01-04 | Disposition: A | Discharge status: Home / Self Care | Payer: Medicare PPO | Source: Ambulatory Visit | Attending: Radiation Oncology | Admitting: Radiation Oncology

## 2021-01-04 ENCOUNTER — Other Ambulatory Visit: Payer: Self-pay

## 2021-01-04 DIAGNOSIS — C50212 Malignant neoplasm of upper-inner quadrant of left female breast: Secondary | ICD-10-CM | POA: Diagnosis not present

## 2021-01-04 DIAGNOSIS — Z170 Estrogen receptor positive status [ER+]: Secondary | ICD-10-CM | POA: Diagnosis not present

## 2021-01-04 DIAGNOSIS — Z510 Encounter for antineoplastic radiation therapy: Secondary | ICD-10-CM | POA: Diagnosis not present

## 2021-01-05 ENCOUNTER — Ambulatory Visit
Admission: RE | Admit: 2021-01-05 | Discharge: 2021-01-05 | Disposition: A | Discharge status: Home / Self Care | Payer: Medicare PPO | Source: Ambulatory Visit | Attending: Radiation Oncology | Admitting: Radiation Oncology

## 2021-01-05 DIAGNOSIS — Z170 Estrogen receptor positive status [ER+]: Secondary | ICD-10-CM | POA: Diagnosis not present

## 2021-01-05 DIAGNOSIS — C50212 Malignant neoplasm of upper-inner quadrant of left female breast: Secondary | ICD-10-CM | POA: Diagnosis not present

## 2021-01-05 DIAGNOSIS — Z510 Encounter for antineoplastic radiation therapy: Secondary | ICD-10-CM | POA: Diagnosis not present

## 2021-01-06 ENCOUNTER — Ambulatory Visit
Admission: RE | Admit: 2021-01-06 | Discharge: 2021-01-06 | Disposition: A | Discharge status: Home / Self Care | Payer: Medicare PPO | Source: Ambulatory Visit | Attending: Radiation Oncology | Admitting: Radiation Oncology

## 2021-01-06 DIAGNOSIS — Z170 Estrogen receptor positive status [ER+]: Secondary | ICD-10-CM | POA: Diagnosis not present

## 2021-01-06 DIAGNOSIS — C50212 Malignant neoplasm of upper-inner quadrant of left female breast: Secondary | ICD-10-CM | POA: Diagnosis not present

## 2021-01-06 DIAGNOSIS — Z510 Encounter for antineoplastic radiation therapy: Secondary | ICD-10-CM | POA: Diagnosis not present

## 2021-01-06 MED ORDER — RADIAPLEXRX EX GEL: Freq: Once | CUTANEOUS | Status: AC

## 2021-01-09 ENCOUNTER — Ambulatory Visit
Admission: RE | Admit: 2021-01-09 | Discharge: 2021-01-09 | Disposition: A | Discharge status: Home / Self Care | Payer: Medicare PPO | Source: Ambulatory Visit | Attending: Radiation Oncology | Admitting: Radiation Oncology

## 2021-01-09 ENCOUNTER — Other Ambulatory Visit: Payer: Self-pay

## 2021-01-09 DIAGNOSIS — Z510 Encounter for antineoplastic radiation therapy: Secondary | ICD-10-CM | POA: Diagnosis not present

## 2021-01-09 DIAGNOSIS — C50212 Malignant neoplasm of upper-inner quadrant of left female breast: Secondary | ICD-10-CM | POA: Diagnosis not present

## 2021-01-09 DIAGNOSIS — Z170 Estrogen receptor positive status [ER+]: Secondary | ICD-10-CM | POA: Diagnosis not present

## 2021-01-10 ENCOUNTER — Ambulatory Visit
Admission: RE | Admit: 2021-01-10 | Discharge: 2021-01-10 | Disposition: A | Discharge status: Home / Self Care | Payer: Medicare PPO | Source: Ambulatory Visit | Attending: Radiation Oncology | Admitting: Radiation Oncology

## 2021-01-10 ENCOUNTER — Encounter: Payer: Self-pay | Admitting: *Deleted

## 2021-01-10 DIAGNOSIS — Z170 Estrogen receptor positive status [ER+]: Secondary | ICD-10-CM | POA: Diagnosis not present

## 2021-01-10 DIAGNOSIS — C50212 Malignant neoplasm of upper-inner quadrant of left female breast: Secondary | ICD-10-CM

## 2021-01-10 DIAGNOSIS — Z510 Encounter for antineoplastic radiation therapy: Secondary | ICD-10-CM | POA: Diagnosis not present

## 2021-01-11 ENCOUNTER — Other Ambulatory Visit: Payer: Self-pay

## 2021-01-11 ENCOUNTER — Ambulatory Visit: Payer: Medicare PPO

## 2021-01-11 ENCOUNTER — Ambulatory Visit
Admission: RE | Admit: 2021-01-11 | Discharge: 2021-01-11 | Disposition: A | Discharge status: Home / Self Care | Payer: Medicare PPO | Source: Ambulatory Visit | Attending: Radiation Oncology | Admitting: Radiation Oncology

## 2021-01-11 DIAGNOSIS — Z510 Encounter for antineoplastic radiation therapy: Secondary | ICD-10-CM | POA: Diagnosis not present

## 2021-01-11 DIAGNOSIS — Z170 Estrogen receptor positive status [ER+]: Secondary | ICD-10-CM | POA: Diagnosis not present

## 2021-01-11 DIAGNOSIS — C50212 Malignant neoplasm of upper-inner quadrant of left female breast: Secondary | ICD-10-CM | POA: Diagnosis not present

## 2021-01-12 ENCOUNTER — Ambulatory Visit
Admission: RE | Admit: 2021-01-12 | Discharge: 2021-01-12 | Disposition: A | Discharge status: Home / Self Care | Payer: Medicare PPO | Source: Ambulatory Visit | Attending: Radiation Oncology | Admitting: Radiation Oncology

## 2021-01-12 ENCOUNTER — Encounter: Payer: Self-pay | Admitting: Radiation Oncology

## 2021-01-12 DIAGNOSIS — Z170 Estrogen receptor positive status [ER+]: Secondary | ICD-10-CM | POA: Diagnosis not present

## 2021-01-12 DIAGNOSIS — C50212 Malignant neoplasm of upper-inner quadrant of left female breast: Secondary | ICD-10-CM | POA: Diagnosis not present

## 2021-01-12 DIAGNOSIS — Z510 Encounter for antineoplastic radiation therapy: Secondary | ICD-10-CM | POA: Diagnosis not present

## 2021-01-23 NOTE — Progress Notes (Signed)
                                                                                                                                                             Patient Name: Destiny Andersen MRN: [id number] DOB: 21-Apr-1946 Referring Physician: Nicholas Lose (Profile Not Attached) Date of Service: 01/12/2021 [facility], [place]                                                        End Of Treatment Note  Diagnoses: C50.212-Malignant neoplasm of upper-inner quadrant of left female breast  Cancer Staging: Stage IA, pT1c, cN0M0 grade 2, ER/PR positive invasive ductal carcinoma of the left breast.  Intent: Curative  Radiation Treatment Dates: 12/14/2020 through 01/12/2021 Site Technique Total Dose (Gy) Dose per Fx (Gy) Completed Fx Beam Energies  Breast, Left: Breast_Lt 3D 42.56/42.56 2.66 16/16 6X, 10X  Breast, Left: Breast_Lt_Bst specialPort 8/8 2 4/4 6E, 9E   Narrative: The patient tolerated radiation therapy relatively well. She had mild fatigue during treatment. She has Grover's disease which was not felt to be prohibitive, and she tolerated treatment regarding her skin very well.  Plan: The patient will receive a call in about one month from the radiation oncology department. She will continue follow up with Dr. Lindi Adie as well.   ________________________________________________    Carola Rhine, [facility]

## 2021-01-24 ENCOUNTER — Other Ambulatory Visit: Payer: Self-pay

## 2021-01-24 ENCOUNTER — Ambulatory Visit
Admission: RE | Admit: 2021-01-24 | Payer: Medicare PPO | Source: Ambulatory Visit | Attending: Radiation Oncology | Admitting: Radiation Oncology

## 2021-01-25 ENCOUNTER — Other Ambulatory Visit: Payer: Self-pay | Admitting: Adult Health

## 2021-01-25 DIAGNOSIS — C50212 Malignant neoplasm of upper-inner quadrant of left female breast: Secondary | ICD-10-CM

## 2021-02-07 DIAGNOSIS — R52 Pain, unspecified: Secondary | ICD-10-CM | POA: Diagnosis not present

## 2021-02-07 DIAGNOSIS — R509 Fever, unspecified: Secondary | ICD-10-CM | POA: Diagnosis not present

## 2021-02-07 DIAGNOSIS — R051 Acute cough: Secondary | ICD-10-CM | POA: Diagnosis not present

## 2021-02-07 DIAGNOSIS — U071 COVID-19: Secondary | ICD-10-CM | POA: Diagnosis not present

## 2021-02-07 DIAGNOSIS — R519 Headache, unspecified: Secondary | ICD-10-CM | POA: Diagnosis not present

## 2021-02-14 ENCOUNTER — Ambulatory Visit
Admission: RE | Admit: 2021-02-14 | Discharge: 2021-02-14 | Disposition: A | Discharge status: Home / Self Care | Payer: Medicare PPO | Source: Ambulatory Visit | Attending: Adult Health | Admitting: Adult Health

## 2021-02-14 DIAGNOSIS — Z170 Estrogen receptor positive status [ER+]: Secondary | ICD-10-CM

## 2021-02-14 DIAGNOSIS — C50212 Malignant neoplasm of upper-inner quadrant of left female breast: Secondary | ICD-10-CM

## 2021-02-17 NOTE — Progress Notes (Signed)
  Radiation Oncology         [phone] ________________________________  Name: ZYA SCIANNA MRN: [id number]  Date of Service: 02/14/2021  DOB: Jul 03, 1945  Post Treatment Telephone Note  Diagnosis:   Stage IA, pT1c, cN0M0 grade 2, ER/PR positive invasive ductal carcinoma of the left breast.  Interval Since Last Radiation:  5 weeks   12/14/2020 through 01/12/2021 Site Technique Total Dose (Gy) Dose per Fx (Gy) Completed Fx Beam Energies  Breast, Left: Breast_Lt 3D 42.56/42.56 2.66 16/16 6X, 10X  Breast, Left: Breast_Lt_Bst specialPort 8/8 2 4/4 6E, 9E    Narrative:  The patient was contacted today for routine follow-up. During treatment she did very well with radiotherapy and did not have significant desquamation. She reports she did not have any trouble with grover's disease flairs.   Impression/Plan: 1. Stage IA, pT1c, cN0M0 grade 2, ER/PR positive invasive ductal carcinoma of the left breast. I was unable to reach the patient but left a voicemail and on the message, I  discussed that we would be happy to continue to follow her as needed, but she will also continue to follow up with Dr. Lindi Adie in medical oncology.      Carola Rhine, PAC

## 2021-02-20 ENCOUNTER — Other Ambulatory Visit: Payer: Self-pay | Admitting: Cardiovascular Disease

## 2021-02-24 DIAGNOSIS — L309 Dermatitis, unspecified: Secondary | ICD-10-CM | POA: Diagnosis not present

## 2021-02-24 DIAGNOSIS — M8580 Other specified disorders of bone density and structure, unspecified site: Secondary | ICD-10-CM | POA: Diagnosis not present

## 2021-02-24 DIAGNOSIS — Z170 Estrogen receptor positive status [ER+]: Secondary | ICD-10-CM | POA: Diagnosis not present

## 2021-02-24 DIAGNOSIS — Z01419 Encounter for gynecological examination (general) (routine) without abnormal findings: Secondary | ICD-10-CM | POA: Diagnosis not present

## 2021-02-24 DIAGNOSIS — C50212 Malignant neoplasm of upper-inner quadrant of left female breast: Secondary | ICD-10-CM | POA: Diagnosis not present

## 2021-02-28 ENCOUNTER — Other Ambulatory Visit: Payer: Self-pay | Admitting: Nurse Practitioner

## 2021-02-28 DIAGNOSIS — M8580 Other specified disorders of bone density and structure, unspecified site: Secondary | ICD-10-CM

## 2021-03-10 ENCOUNTER — Ambulatory Visit: Payer: Medicare PPO | Admitting: Cardiovascular Disease

## 2021-03-10 ENCOUNTER — Other Ambulatory Visit: Payer: Self-pay

## 2021-03-10 ENCOUNTER — Encounter: Payer: Self-pay | Admitting: Cardiovascular Disease

## 2021-03-10 VITALS — BP 118/58 | HR 76 | Ht 64.5 in | Wt 131.0 lb

## 2021-03-10 DIAGNOSIS — Z9889 Other specified postprocedural states: Secondary | ICD-10-CM | POA: Diagnosis not present

## 2021-03-10 DIAGNOSIS — R002 Palpitations: Secondary | ICD-10-CM

## 2021-03-10 NOTE — Progress Notes (Signed)
Cardiology Office Note    Date:  03/10/2021   ID:  ICEL CASTLES, DOB December 30, 1945, MRN [id number]  PCP:  Lujean Amel, MD  Cardiologist:  Dr. Acie Fredrickson  Chief Complaint: s/p MV repair  History of Present Illness:   Destiny Andersen is a 75 y.o. female with a history of mitral valve prolapse with severe MR s/p minimally invasive mitral valve repair and PFO closure who walked in for heart racing.   The patient reportedly was told that she had mitral valve prolapse many years ago. Approximately 3 years ago she was noted by her primary care physician to have a prominent murmur on physical exam and echocardiogram revealed the presence of mitral valve prolapse with severe mitral regurgitation.  She has been followed carefully by Dr. Acie Fredrickson ever since.  She has remained physically active and reportedly asymptomatic. She was seen in follow-up recently by Dr. Acie Fredrickson and transthoracic echocardiogram revealed mitral valve prolapse with a flail segment of the posterior leaflet of mitral valve and severe mitral regurgitation. Left ventricular size and systolic function remains normal. Transesophageal echocardiogram was performed, confirming the presence of a flail segment of the posterior leaflet with ruptured chordae tendineae. L/RHC on 12/21/15 showed normal coronary arteries, normal LV function and severe MR. The patient was referred for surgical consultation.    She ultimately underwent minimally invasive mitral valve repair and PFO closure with Dr. Roxy Manns on 02/01/16. Her post op course was c/b post op thrombocytopenia and volume overload requiring diuresis. The patient was not discharged from the hospital on a beta blocker because of bradycardia during the first couple days after surgery.  Last seen in clinic 02/17/16 for  for follow up. She has been feeling pretty good since the surgery except her back hurts. EKG showed sinus tachycardia at rate of 103 bpm.  The patient was started on metorpolol 12.5mg  BID  due to tachycardia by Dr. Clydene Laming 02/21/16.   Came for INR check and while walking back to car in parking lot she noted HR of 110 and came back for further evaluation. The patient denies nausea, vomiting, fever, chest pain, palpitations, shortness of breath, orthopnea, PND, dizziness, syncope, cough, congestion, abdominal pain, hematochezia, melena, lower extremity edema. Had one episode of throat sensation this morning.   Jan. 8, 2018:  Destiny Andersen is doing well.   we had increased her metoprolol slightly at her last visit  Wears her fit bit and her HR is much better  Doing cardiac rehab.   Doing well.    Has some scar tenderness and rib tenderness.   Aug. 31, 2018: Destiny Andersen is seen today for follow up of her MV repair and PFO closure.  Recent echo shows EF 55-60%.   , good repair of the MV . Mild TR Her incision has healed well It was tight ,   She used a silicone gel to help it heal .  Is walking 30 minutes a day.     Nov 12, 2019:  overal doing well Exercising 16-18 min a day  - limited by a bone spur.  Is seeing sport medicine   January 29, 2020: Destiny Andersen is seen today for follow-up visit.  She has been having creasing palpitations for the past several weeks.  She has had her upper respiratory tract infection.  She tested negative for Covid last week. Covid tested yesterday was also negative   Has not felt well for the past 2 weeks  Fatigued,  Taste is altered , sleeping lots Staying  hydrated.   Drinks plenty of fluids  On Wednesday , she was waking her dog,  Started having significant palpitations,  Very hard heart beats  Was wiped out  Recovered after 2-3 minutes.  Has not walked since then   Eating and drinking normally .  Has had orthostasis for the past 2 weeks.   Symptoms are c/w dehydration  - she drinks maybe 50 oz of water a day  No blood in stool, + fever - temp 99.9 today ,  99.7 yesterday   March 10, 2021: Destiny Andersen is seen today for a follow-up visit.  She has a history  of mitral valve prolapse, status post mitral valve repair. She was having some palpitations when I saw her last year.  Destiny Andersen was found to have COVID  shortly after that visit She has been found to have breast cancer and XRT since I last saw her. Has had a 2nd COVID infection . Took  Paxlovid - tasted very bad,  not sure if she felt better with the Paxlovid.  Took the low dose of Paxlovid ( due to underlying CKD)  No rebound .  Had some prolonged dyspnea   following covid   Doing well from a cardiac standpoint .        Past Medical History:  Diagnosis Date   Breast cancer ([place]) 09/20/2020   Cancer ([place]) 1983   Cervical cancer.  Skin Cancer- leg   Chronic headaches    "? Migraines"- does not have then any more   Constipation    Elevated C-reactive protein (CRP)    Family history of breast cancer    Mother   Fatigue    Grover's disease 2022   Grover's disease    Heart murmur    ECHO 02/10/13 EF = 60-65%, moderate posterior mitral valve prolapse w/possible flail segment, severe MR, mitral regurgitant jet is anteriorly directed.   History of blood transfusion    pre-eclampsia    Hyperpotassemia    Migrated   Medicare annual wellness visit, subsequent    Mitral valve prolapse    MR (mitral regurgitation)    ECHO 02/10/13 EF = 60-65%, moderate posterior mitral valve prolapse w/possible flail segment, severe MR, mitral regurgitant jet is anteriorly directed.   Risk for falls    Routine general medical examination at a health care facility    S/P minimally invasive mitral valve repair 02/01/2016   Complex valvuloplasty including triangular resection of flail posterior leaflet, artificial Gore-tex neochord placement x10 and 28 mm Sorin Memo 3D ring annuloplasty via right mini thoracotomy approach   Seizures ([place])    with pre- echa   Shortness of breath dyspnea    with exertion    Past Surgical History:  Procedure Laterality Date   BREAST BIOPSY Right 1998   benign cyst   BREAST  LUMPECTOMY WITH RADIOACTIVE SEED LOCALIZATION Left 10/27/2020   Procedure: LEFT BREAST LUMPECTOMY WITH RADIOACTIVE SEED LOCALIZATION;  Surgeon: Coralie Keens, MD;  Location: [facility];  Service: General;  Laterality: Left;   CARDIAC CATHETERIZATION N/A 12/21/2015   Procedure: Right/Left Heart Cath and Coronary Angiography;  Surgeon: Sherren Mocha, MD;  Location: [facility];  Service: Cardiovascular;  Laterality: N/A;   COLONOSCOPY  08/2001   Colon cancer screening in 01/2012   [place]   left knee arthroscopic surgery Left 05/29/2018   MITRAL VALVE REPAIR Right 02/01/2016   Procedure: MINIMALLY INVASIVE MITRAL VALVE REPAIR (MVR) with closure of PFO;  Surgeon:  Rexene Alberts, MD;  Location: [facility];  Service: Open Heart Surgery;  Laterality: Right;   TEE WITHOUT CARDIOVERSION N/A 10/14/2015   Procedure: TRANSESOPHAGEAL ECHOCARDIOGRAM (TEE);  Surgeon: Thayer Headings, MD;  Location: [facility];  Service: Cardiovascular;  Laterality: N/A;   TEE WITHOUT CARDIOVERSION N/A 02/01/2016   Procedure: TRANSESOPHAGEAL ECHOCARDIOGRAM (TEE);  Surgeon: Rexene Alberts, MD;  Location: [facility];  Service: Open Heart Surgery;  Laterality: N/A;   TOTAL ABDOMINAL HYSTERECTOMY  1983   Cervical cancer 1983    Current Medications: Prior to Admission medications   Medication Sig Start Date End Date Taking? Authorizing Provider  acetaminophen (TYLENOL) 325 MG tablet Take 650 mg by mouth every 6 (six) hours as needed.    Historical Provider, MD  aspirin 81 MG tablet Take 81 mg by mouth daily.    Historical Provider, MD  calcium citrate-vitamin D (CITRACAL+D) 315-200 MG-UNIT tablet Take 1 tablet by mouth 3 (three) times daily. VITAMIN D DOSAGE IS 250MG  INSTEAD OF 200    Historical Provider, MD  diclofenac sodium (VOLTAREN) 1 % GEL Apply 2 g topically as needed (back pain). 02/07/16   Donielle Liston Alba, PA-C  metoprolol tartrate (LOPRESSOR) 25 MG tablet Take 0.5 tablets  (12.5 mg total) by mouth 2 (two) times daily. 02/21/16   Rexene Alberts, MD  traMADol (ULTRAM) 50 MG tablet Take 1 tablet (50 mg total) by mouth every 6 (six) hours as needed for moderate pain. 02/07/16   Donielle Liston Alba, PA-C  warfarin (COUMADIN) 2.5 MG tablet Take 1 to 1.5 tablets by mouth daily as directed by coumadin clinic 03/09/16   Thayer Headings, MD    Allergies:   Penicillins, Codone [hydrocodone], Codeine, Doxycycline hyclate, and Ultram [tramadol]   Social History   Socioeconomic History   Marital status: Married    Spouse name: Not on file   Number of children: 1   Years of education: Not on file   Highest education level: Not on file  Occupational History   Not on file  Tobacco Use   Smoking status: Former    Years: 25.00    Types: Cigarettes    Quit date: 06/18/1985    Years since quitting: 35.7   Smokeless tobacco: Never  Substance and Sexual Activity   Alcohol use: Not Currently   Drug use: No   Sexual activity: Never  Other Topics Concern   Not on file  Social History Narrative   Not on file   Social Determinants of Health   Financial Resource Strain: Not on file  Food Insecurity: Not on file  Transportation Needs: Not on file  Physical Activity: Not on file  Stress: Not on file  Social Connections: Not on file     Family History:  The patient's family history includes AAA (abdominal aortic aneurysm) in her father; Breast cancer (age of onset: 40) in her mother; CAD in her father and mother; Heart disease in her father and mother; High Cholesterol in her brother; Hypertension in her brother, father, and mother; Skin cancer in her brother.   ROS:   Please see the history of present illness.    ROS All other systems reviewed and are negative.   PHYSICAL EXAM:     Physical Exam: Blood pressure (!) 118/58, pulse 76, height 5' 4.5" (1.638 m), weight 131 lb (59.4 kg), SpO2 97 %.  GEN:  Well nourished, well developed in no acute distress HEENT:  Normal NECK: No JVD; No carotid bruits LYMPHATICS: No  lymphadenopathy CARDIAC: RRR very soft systolic murmur RESPIRATORY:  Clear to auscultation without rales, wheezing or rhonchi  ABDOMEN: Soft, non-tender, non-distended MUSCULOSKELETAL:  No edema; No deformity  SKIN: Warm and dry NEUROLOGIC:  Alert and oriented x 3  ECG: March 10, 2021: Normal sinus rhythm at 76.  No ST or T wave changes.  Wt Readings from Last 3 Encounters:  01/02/21 133 lb 4.8 oz (60.5 kg)  11/29/20 133 lb 9.6 oz (60.6 kg)  11/29/20 133 lb 9.6 oz (60.6 kg)      Studies/Labs Reviewed:      Recent Labs: 03/11/2020: BUN 17; Creatinine, Ser 1.05; Potassium 4.4; Sodium 138   Lipid Panel No results found for: CHOL, TRIG, HDL, CHOLHDL, VLDL, LDLCALC, LDLDIRECT  Additional studies/ records that were reviewed today include:   As above  ASSESSMENT & PLAN:       2. Mitral valve prolapse with severe MR: Status post mitral valve repair.  She clinically is doing very well.  Her valve sounds great.  I will see her again in 1 year.     Signed, Mertie Moores, MD  03/10/2021 11:15 AM    [address], [place], [place]  [address] Phone: [phone]; Fax: [phone]

## 2021-03-10 NOTE — Patient Instructions (Signed)

## 2021-03-20 DIAGNOSIS — Z23 Encounter for immunization: Secondary | ICD-10-CM | POA: Diagnosis not present

## 2021-03-20 DIAGNOSIS — S60551A Superficial foreign body of right hand, initial encounter: Secondary | ICD-10-CM | POA: Diagnosis not present

## 2021-03-30 ENCOUNTER — Other Ambulatory Visit: Payer: Self-pay | Admitting: Hematology and Oncology

## 2021-03-30 DIAGNOSIS — Z9889 Other specified postprocedural states: Secondary | ICD-10-CM

## 2021-03-30 DIAGNOSIS — Z853 Personal history of malignant neoplasm of breast: Secondary | ICD-10-CM

## 2021-04-02 ENCOUNTER — Other Ambulatory Visit: Payer: Self-pay | Admitting: Cardiovascular Disease

## 2021-04-11 ENCOUNTER — Telehealth: Payer: Self-pay | Admitting: *Deleted

## 2021-04-13 ENCOUNTER — Encounter: Payer: Self-pay | Admitting: Adult Health

## 2021-04-13 ENCOUNTER — Other Ambulatory Visit: Payer: Self-pay

## 2021-04-13 ENCOUNTER — Inpatient Hospital Stay: Payer: Medicare PPO | Attending: Hematology and Oncology | Admitting: Adult Health

## 2021-04-13 VITALS — BP 119/65 | HR 81 | Temp 97.3°F | Resp 18 | Ht 64.5 in | Wt 130.5 lb

## 2021-04-13 DIAGNOSIS — Z923 Personal history of irradiation: Secondary | ICD-10-CM | POA: Insufficient documentation

## 2021-04-13 DIAGNOSIS — Z170 Estrogen receptor positive status [ER+]: Secondary | ICD-10-CM | POA: Diagnosis not present

## 2021-04-13 DIAGNOSIS — C50212 Malignant neoplasm of upper-inner quadrant of left female breast: Secondary | ICD-10-CM | POA: Diagnosis not present

## 2021-04-13 DIAGNOSIS — Z853 Personal history of malignant neoplasm of breast: Secondary | ICD-10-CM | POA: Diagnosis not present

## 2021-04-13 NOTE — Progress Notes (Signed)
SURVIVORSHIP VISIT:   BRIEF ONCOLOGIC HISTORY:  Oncology History  Malignant neoplasm of upper-inner quadrant of left breast in female, estrogen receptor positive ([place])  09/20/2020 Cancer Staging   Staging form: Breast, AJCC 8th Edition - Clinical stage from 09/20/2020: Stage IA (cT1c, cN0, cM0, G2, ER+, PR+, HER2-) - Signed by Gardenia Phlegm, NP on 09/28/2020 Stage prefix: Initial diagnosis Histologic grading system: 3 grade system    09/28/2020 Initial Diagnosis   Screening mammogram showed a possible left breast mass. Diagnostic mammogram and US showed a 1.3cm left breast mass at the 11 o'clock position and no left axillary adenopathy. Biopsy showed invasive ductal carcinoma, grade 2, HER-2 equivocal by IHC, negative by FISH (ratio 1.26), ER/PR+ 90%, KI67 15%.    10/27/2020 Surgery   Left lumpectomy Ninfa Linden): IDC, 1.5cm, clear margins   10/27/2020 Oncotype testing   Score: 9 (ROR 3%)   12/15/2020 - 01/12/2021 Radiation Therapy   Adj XRT     INTERVAL HISTORY:  Destiny Andersen to review her survivorship care plan detailing her treatment course for breast cancer, as well as monitoring long-term side effects of that treatment, education regarding health maintenance, screening, and overall wellness and health promotion.     Overall, Destiny Andersen reports feeling quite well.  She has no concerns today or questions.   REVIEW OF SYSTEMS:  Review of Systems  Constitutional:  Negative for appetite change, chills, fatigue, fever and unexpected weight change.  HENT:   Negative for hearing loss, lump/mass and trouble swallowing.   Eyes:  Negative for eye problems and icterus.  Respiratory:  Negative for chest tightness, cough and shortness of breath.   Cardiovascular:  Negative for chest pain, leg swelling and palpitations.  Gastrointestinal:  Negative for abdominal distention, abdominal pain, constipation, diarrhea, nausea and vomiting.  Endocrine: Negative for hot flashes.  Genitourinary:   Negative for difficulty urinating.   Musculoskeletal:  Negative for arthralgias.  Skin:  Negative for itching and rash.  Neurological:  Negative for dizziness, extremity weakness, headaches and numbness.  Hematological:  Negative for adenopathy. Does not bruise/bleed easily.  Psychiatric/Behavioral:  Negative for depression. The patient is not nervous/anxious.   Breast: Denies any new nodularity, masses, tenderness, nipple changes, or nipple discharge.      ONCOLOGY TREATMENT TEAM:  1. Surgeon:  Dr. Ninfa Linden at [facility] 2. Medical Oncologist: Dr. Lindi Adie  3. Radiation Oncologist: Dr. Lisbeth Renshaw    PAST MEDICAL/SURGICAL HISTORY:  Past Medical History:  Diagnosis Date   Breast cancer ([place]) 09/20/2020   Cancer ([employer]) 1983   Cervical cancer.  Skin Cancer- leg   Chronic headaches    "? Migraines"- does not have then any more   Constipation    Elevated C-reactive protein (CRP)    Family history of breast cancer    Mother   Fatigue    Grover's disease 2022   Grover's disease    Heart murmur    ECHO 02/10/13 EF = 60-65%, moderate posterior mitral valve prolapse w/possible flail segment, severe MR, mitral regurgitant jet is anteriorly directed.   History of blood transfusion    pre-eclampsia    Hyperpotassemia    Migrated   Medicare annual wellness visit, subsequent    Mitral valve prolapse    MR (mitral regurgitation)    ECHO 02/10/13 EF = 60-65%, moderate posterior mitral valve prolapse w/possible flail segment, severe MR, mitral regurgitant jet is anteriorly directed.   Risk for falls    Routine general medical examination at a health care facility  S/P minimally invasive mitral valve repair 02/01/2016   Complex valvuloplasty including triangular resection of flail posterior leaflet, artificial Gore-tex neochord placement x10 and 28 mm Sorin Memo 3D ring annuloplasty via right mini thoracotomy approach   Seizures ([place])    with pre- echa   Shortness of breath dyspnea     with exertion   Past Surgical History:  Procedure Laterality Date   BREAST BIOPSY Right 1998   benign cyst   BREAST LUMPECTOMY WITH RADIOACTIVE SEED LOCALIZATION Left 10/27/2020   Procedure: LEFT BREAST LUMPECTOMY WITH RADIOACTIVE SEED LOCALIZATION;  Surgeon: Coralie Keens, MD;  Location: [place];  Service: General;  Laterality: Left;   CARDIAC CATHETERIZATION N/A 12/21/2015   Procedure: Right/Left Heart Cath and Coronary Angiography;  Surgeon: Sherren Mocha, MD;  Location: [place] [facility];  Service: Cardiovascular;  Laterality: N/A;   COLONOSCOPY  08/2001   Colon cancer screening in 01/2012   LAPAROSCOPIC CHOLECYSTECTOMY  1990   left knee arthroscopic surgery Left 05/29/2018   MITRAL VALVE REPAIR Right 02/01/2016   Procedure: MINIMALLY INVASIVE MITRAL VALVE REPAIR (MVR) with closure of PFO;  Surgeon: Rexene Alberts, MD;  Location: [place];  Service: Open Heart Surgery;  Laterality: Right;   TEE WITHOUT CARDIOVERSION N/A 10/14/2015   Procedure: TRANSESOPHAGEAL ECHOCARDIOGRAM (TEE);  Surgeon: Thayer Headings, MD;  Location: [place];  Service: Cardiovascular;  Laterality: N/A;   TEE WITHOUT CARDIOVERSION N/A 02/01/2016   Procedure: TRANSESOPHAGEAL ECHOCARDIOGRAM (TEE);  Surgeon: Rexene Alberts, MD;  Location: [place];  Service: Open Heart Surgery;  Laterality: N/A;   TOTAL ABDOMINAL HYSTERECTOMY  1983   Cervical cancer 1983     ALLERGIES:  Allergies  Allergen Reactions   Penicillins Anaphylaxis    Has patient had a PCN reaction causing immediate rash, facial/tongue/throat swelling, SOB or lightheadedness with hypotension: Yes Has patient had a PCN reaction causing severe rash involving mucus membranes or skin necrosis: No Has patient had a PCN reaction that required hospitalization Yes Has patient had a PCN reaction occurring within the last 10 years: No If all of the above answers are "NO", then may proceed with Cephalosporin use.    Codone [Hydrocodone]     Codeine Other (See Comments)    AND CODEINE DERIVATIVES-  Abdominal pain   Doxycycline Hyclate Nausea And Vomiting   Ultram [Tramadol] Nausea And Vomiting     CURRENT MEDICATIONS:  Outpatient Encounter Medications as of 04/13/2021  Medication Sig   aspirin 81 MG tablet Take 81 mg by mouth daily.   Biotin 1 MG CAPS Take 1 capsule by mouth daily.   calcium citrate-vitamin D (CITRACAL+D) 315-200 MG-UNIT tablet Take 1 tablet by mouth 2 (two) times daily. VITAMIN D DOSAGE IS 250MG INSTEAD OF 200   clindamycin (CLEOCIN) 300 MG capsule TAKE 2 CAPSULES BY MOUTH AS DIRECTED 1 HOUR PRIOR TO DENTAL WORK   clotrimazole-betamethasone (LOTRISONE) cream Apply 1 application topically as needed.   metoprolol tartrate (LOPRESSOR) 25 MG tablet TAKE 1/2 TABLET BY MOUTH EVERY MORNING THEN TAKE 1 TABLET BY MOUTH EVERY EVENING   No facility-administered encounter medications on file as of 04/13/2021.     ONCOLOGIC FAMILY HISTORY:  Family History  Problem Relation Age of Onset   Hypertension Mother    Heart disease Mother    CAD Mother    Breast cancer Mother 36   AAA (abdominal aortic aneurysm) Father    CAD Father    Hypertension Father    Heart disease Father  High Cholesterol Brother    Hypertension Brother    Skin cancer Brother      SOCIAL HISTORY:  Social History   Socioeconomic History   Marital status: Married    Spouse name: Not on file   Number of children: 1   Years of education: Not on file   Highest education level: Not on file  Occupational History   Not on file  Tobacco Use   Smoking status: Former    Years: 25.00    Types: Cigarettes    Quit date: 06/18/1985    Years since quitting: 35.8   Smokeless tobacco: Never  Substance and Sexual Activity   Alcohol use: Not Currently   Drug use: No   Sexual activity: Never  Other Topics Concern   Not on file  Social History Narrative   Not on file   Social Determinants of Health   Financial Resource Strain: Not  on file  Food Insecurity: Not on file  Transportation Needs: Not on file  Physical Activity: Not on file  Stress: Not on file  Social Connections: Not on file  Intimate Partner Violence: Not At Risk   Fear of Current or Ex-Partner: No   Emotionally Abused: No   Physically Abused: No   Sexually Abused: No     OBSERVATIONS/OBJECTIVE:  BP 119/65 (BP Location: Left Arm, Patient Position: Sitting)   Pulse 81   Temp (!) 97.3 F (36.3 C) (Temporal)   Resp 18   Ht 5' 4.5" (1.638 m)   Wt 130 lb 8 oz (59.2 kg)   SpO2 99%   BMI 22.05 kg/m  GENERAL: Patient is a well appearing female in no acute distress HEENT:  Sclerae anicteric.  Mask in place. Neck is supple.  NODES:  No cervical, supraclavicular, or axillary lymphadenopathy palpated.  BREAST EXAM:  Deferred. LUNGS:  Clear to auscultation bilaterally.  No wheezes or rhonchi. HEART:  Regular rate and rhythm. No murmur appreciated. ABDOMEN:  Soft, nontender.  Positive, normoactive bowel sounds. No organomegaly palpated. MSK:  No focal spinal tenderness to palpation. Full range of motion bilaterally in the upper extremities. EXTREMITIES:  No peripheral edema.   SKIN:  Clear with no obvious rashes or skin changes. No nail dyscrasia. NEURO:  Nonfocal. Well oriented.  Appropriate affect.   LABORATORY DATA:  None for this visit.  DIAGNOSTIC IMAGING:  None for this visit.      ASSESSMENT AND PLAN:  Ms.. Andersen is a pleasant 75 y.o. female with Stage IA left breast invasive ductal carcinoma, ER+/PR+/HER2-, diagnosed in 09/2020, treated with lumpectomy and adjuvant radiation therapy.  She presents to the Survivorship Clinic for our initial meeting and routine follow-up post-completion of treatment for breast cancer.    1. Stage IA left breast cancer:  Destiny Andersen is continuing to recover from definitive treatment for breast cancer. She will follow-up with her medical oncologist, Dr. Lindi Adie in 6 months with history and physical exam per  surveillance protocol. Her mammogram is due 08/2021. Today, a comprehensive survivorship care plan and treatment summary was reviewed with the patient today detailing her breast cancer diagnosis, treatment course, potential late/long-term effects of treatment, appropriate follow-up care with recommendations for the future, and patient education resources.  A copy of this summary, along with a letter will be sent to the patient's primary care provider via mail/fax/In Basket message after today's visit.    2. Bone health:  Her next bone density is scheduled in 08/2021. She was given education on specific activities  to promote bone health.  3. Cancer screening:  Due to Destiny Andersen history and her age, she should receive screening for skin cancers, colon cancer, and gynecologic cancers.  The information and recommendations are listed on the patient's comprehensive care plan/treatment summary and were reviewed in detail with the patient.    4. Health maintenance and wellness promotion: Destiny Andersen was encouraged to consume 5-7 servings of fruits and vegetables per day.  She was also encouraged to engage in moderate to vigorous exercise for 30 minutes per day most days of the week. We discussed the [employer] fitness program, which is designed for cancer survivors to help them become more physically fit after cancer treatments.  She was instructed to limit her alcohol consumption and continue to abstain from tobacco use.     5. Support services/counseling: It is not uncommon for this period of the patient's cancer care trajectory to be one of many emotions and stressors.    She was given information regarding our available services and encouraged to contact me with any questions or for help enrolling in any of our support group/programs.    Follow up instructions:    -Return to cancer center in 10/2021 for f/u with Dr. Lindi Adie  -Mammogram due in 08/2021 -Follow up with surgery 04/2021 -She is welcome to  return back to the Survivorship Clinic at any time; no additional follow-up needed at this time.  -Consider referral back to survivorship as a long-term survivor for continued surveillance  The patient was provided an opportunity to ask questions and all were answered. The patient agreed with the plan and demonstrated an understanding of the instructions.   Total encounter time: 30 minutes in face to face visit time, chart review, lab review, order entry, and documentation of the encounter.   Wilber Bihari, NP 04/13/21 11:41 AM Medical Oncology and Hematology [facility] [place], [place] [address] Tel. [phone]    Fax. [phone]  *Total Encounter Time as defined by the Centers for Medicare and Medicaid Services includes, in addition to the face-to-face time of a patient visit (documented in the note above) non-face-to-face time: obtaining and reviewing outside history, ordering and reviewing medications, tests or procedures, care coordination (communications with other health care professionals or caregivers) and documentation in the medical record.

## 2021-04-25 DIAGNOSIS — C50012 Malignant neoplasm of nipple and areola, left female breast: Secondary | ICD-10-CM | POA: Diagnosis not present

## 2021-05-19 DIAGNOSIS — E7800 Pure hypercholesterolemia, unspecified: Secondary | ICD-10-CM | POA: Diagnosis not present

## 2021-05-19 DIAGNOSIS — Z79899 Other long term (current) drug therapy: Secondary | ICD-10-CM | POA: Diagnosis not present

## 2021-05-22 DIAGNOSIS — R7401 Elevation of levels of liver transaminase levels: Secondary | ICD-10-CM | POA: Diagnosis not present

## 2021-05-22 DIAGNOSIS — N1831 Chronic kidney disease, stage 3a: Secondary | ICD-10-CM | POA: Diagnosis not present

## 2021-05-22 DIAGNOSIS — Z0001 Encounter for general adult medical examination with abnormal findings: Secondary | ICD-10-CM | POA: Diagnosis not present

## 2021-06-20 DIAGNOSIS — N1831 Chronic kidney disease, stage 3a: Secondary | ICD-10-CM | POA: Diagnosis not present

## 2021-06-21 DIAGNOSIS — Z85828 Personal history of other malignant neoplasm of skin: Secondary | ICD-10-CM | POA: Diagnosis not present

## 2021-06-21 DIAGNOSIS — D225 Melanocytic nevi of trunk: Secondary | ICD-10-CM | POA: Diagnosis not present

## 2021-06-21 DIAGNOSIS — L821 Other seborrheic keratosis: Secondary | ICD-10-CM | POA: Diagnosis not present

## 2021-06-21 DIAGNOSIS — D485 Neoplasm of uncertain behavior of skin: Secondary | ICD-10-CM | POA: Diagnosis not present

## 2021-08-31 DIAGNOSIS — M542 Cervicalgia: Secondary | ICD-10-CM | POA: Diagnosis not present

## 2021-09-04 ENCOUNTER — Ambulatory Visit
Admission: RE | Admit: 2021-09-04 | Discharge: 2021-09-04 | Disposition: A | Discharge status: Home / Self Care | Payer: Medicare PPO | Source: Ambulatory Visit | Attending: Nurse Practitioner | Admitting: Nurse Practitioner

## 2021-09-04 ENCOUNTER — Ambulatory Visit
Admission: RE | Admit: 2021-09-04 | Discharge: 2021-09-04 | Disposition: A | Discharge status: Home / Self Care | Payer: Medicare PPO | Source: Ambulatory Visit | Attending: Hematology and Oncology | Admitting: Hematology and Oncology

## 2021-09-04 DIAGNOSIS — Z9889 Other specified postprocedural states: Secondary | ICD-10-CM

## 2021-09-04 DIAGNOSIS — Z853 Personal history of malignant neoplasm of breast: Secondary | ICD-10-CM

## 2021-09-04 DIAGNOSIS — Z780 Asymptomatic menopausal state: Secondary | ICD-10-CM | POA: Diagnosis not present

## 2021-09-04 DIAGNOSIS — R922 Inconclusive mammogram: Secondary | ICD-10-CM | POA: Diagnosis not present

## 2021-09-04 DIAGNOSIS — M85852 Other specified disorders of bone density and structure, left thigh: Secondary | ICD-10-CM | POA: Diagnosis not present

## 2021-09-04 DIAGNOSIS — M8580 Other specified disorders of bone density and structure, unspecified site: Secondary | ICD-10-CM

## 2021-09-06 DIAGNOSIS — Z961 Presence of intraocular lens: Secondary | ICD-10-CM | POA: Diagnosis not present

## 2021-09-06 DIAGNOSIS — H524 Presbyopia: Secondary | ICD-10-CM | POA: Diagnosis not present

## 2021-09-08 DIAGNOSIS — M542 Cervicalgia: Secondary | ICD-10-CM | POA: Diagnosis not present

## 2021-09-14 DIAGNOSIS — M542 Cervicalgia: Secondary | ICD-10-CM | POA: Diagnosis not present

## 2021-09-20 ENCOUNTER — Encounter (HOSPITAL_COMMUNITY): Payer: Self-pay

## 2021-11-03 NOTE — Progress Notes (Signed)
Patient Care Team: Lujean Amel, MD as PCP - General (Family Medicine) [name], Wonda Cheng, MD as PCP - Cardiology (Cardiology) Kyung Rudd, MD as Consulting Physician (Radiation Oncology) Nicholas Lose, MD as Consulting Physician (Hematology and Oncology) Coralie Keens, MD as Consulting Physician (General Surgery)  DIAGNOSIS:  Encounter Diagnosis  Name Primary?   Malignant neoplasm of upper-inner quadrant of left breast in female, estrogen receptor positive ([name])     SUMMARY OF ONCOLOGIC HISTORY: Oncology History  Malignant neoplasm of upper-inner quadrant of left breast in female, estrogen receptor positive ([name])  09/20/2020 Cancer Staging   Staging form: Breast, AJCC 8th Edition - Clinical stage from 09/20/2020: Stage IA (cT1c, cN0, cM0, G2, ER+, PR+, HER2-) - Signed by Gardenia Phlegm, NP on 09/28/2020 Stage prefix: Initial diagnosis Histologic grading system: 3 grade system    09/28/2020 Initial Diagnosis   Screening mammogram showed a possible left breast mass. Diagnostic mammogram and US showed a 1.3cm left breast mass at the 11 o'clock position and no left axillary adenopathy. Biopsy showed invasive ductal carcinoma, grade 2, HER-2 equivocal by IHC, negative by FISH (ratio 1.26), ER/PR+ 90%, KI67 15%.    10/27/2020 Surgery   Left lumpectomy Ninfa Linden): IDC, 1.5cm, clear margins   10/27/2020 Oncotype testing   Score: 9 (ROR 3%)   12/15/2020 - 01/12/2021 Radiation Therapy   Adj XRT     CHIEF COMPLIANT: Follow-up of left breast cancer  INTERVAL HISTORY: Destiny Andersen is a 76 y.o. with above-mentioned history of left breast cancer having undergone a lumpectomy. She reports to the clinic today for follow-up. She denies pain and discomfort in breast.  There is a slight degree of discomfort along the surgical scars.   ALLERGIES:  is allergic to penicillins, codone [hydrocodone], oxycodone-acetaminophen, codeine, doxycycline hyclate, and ultram  [tramadol].  MEDICATIONS:  Current Outpatient Medications  Medication Sig Dispense Refill   aspirin 81 MG tablet Take 81 mg by mouth daily.     Biotin 1 MG CAPS Take 1 capsule by mouth daily. 30 capsule    calcium citrate-vitamin D (CITRACAL+D) 315-200 MG-UNIT tablet Take 1 tablet by mouth 2 (two) times daily. VITAMIN D DOSAGE IS 250MG INSTEAD OF 200     cholecalciferol (VITAMIN D3) 25 MCG (1000 UNIT) tablet Take 1,000 Units by mouth daily.     clindamycin (CLEOCIN) 300 MG capsule TAKE 2 CAPSULES BY MOUTH AS DIRECTED 1 HOUR PRIOR TO DENTAL WORK 2 capsule 0   clotrimazole-betamethasone (LOTRISONE) cream Apply 1 application. topically as needed.     metoprolol tartrate (LOPRESSOR) 25 MG tablet TAKE 1/2 TABLET BY MOUTH EVERY MORNING THEN TAKE 1 TABLET BY MOUTH EVERY EVENING 135 tablet 3   No current facility-administered medications for this visit.    PHYSICAL EXAMINATION: ECOG PERFORMANCE STATUS: 1 - Symptomatic but completely ambulatory  Vitals:   11/10/21 1128  BP: 123/68  Pulse: 79  Resp: 18  Temp: (!) 97.4 F (36.3 C)  SpO2: 100%   Filed Weights   11/10/21 1128  Weight: 129 lb (58.5 kg)    BREAST: Palpable nodule in the left upper chest wall, scar (exam performed in the presence of a chaperone)  LABORATORY DATA:  I have reviewed the data as listed    Latest Ref Rng & Units 03/11/2020   11:05 AM 01/29/2020    2:22 PM 02/05/2016    3:08 AM  CMP  Glucose 65 - 99 mg/dL 77   105   102    BUN 8 - 27 mg/dL  '17   16   9    ' Creatinine 0.57 - 1.00 mg/dL 1.05   1.12   0.89    Sodium 134 - 144 mmol/L 138   132   141    Potassium 3.5 - 5.2 mmol/L 4.4   4.3   4.4    Chloride 96 - 106 mmol/L 102   96   106    CO2 20 - 29 mmol/L '26   24   28    ' Calcium 8.7 - 10.3 mg/dL 9.9   9.6   9.0      Lab Results  Component Value Date   WBC 6.2 01/29/2020   HGB 11.9 01/29/2020   HCT 33.7 (L) 01/29/2020   MCV 96 01/29/2020   PLT 273 01/29/2020   NEUTROABS 3.5 01/29/2020    ASSESSMENT  & PLAN:  Malignant neoplasm of upper-inner quadrant of left breast in female, estrogen receptor positive ([name]) 09/28/2020:Screening mammogram showed a possible left breast mass. Diagnostic mammogram and US showed a 1.3cm left breast mass at the 11 o'clock position and no left axillary adenopathy. Biopsy showed invasive ductal carcinoma, grade 2, HER-2 equivocal by IHC, negative by FISH (ratio 1.26), ER/PR+ 90%, KI67 15%.   Treatment Plan: 1. 10/27/20 Breast conserving surgery: Grade 2 IDC, ER 90%, PR 9%, Her 2 Neg, Ki 67: 15% 2. Oncotype DX testing: Score 9 (3% ROR) 3. Adjuvant radiation therapy to complete 01/12/21 4. Adjuvant antiestrogen therapy with letrozole (refused) ------------------------------------------------------------------------------------------------------------------ Breast cancer surveillance: 1.  Breast exam 11/10/2021: Benign 2. mammogram 09/04/2021: Benign breast density category B  Palpable nodule left chest wall upper aspect along scar tissue: We will request an ultrasound evaluation to determine if this is benign.  If that comes back as benign then she can return to clinic in 1 year for follow-up    Orders Placed This Encounter  Procedures   US BREAST LTD UNI LEFT INC AXILLA    Standing Status:   Future    Standing Expiration Date:   11/11/2022    Order Specific Question:   Reason for Exam (SYMPTOM  OR DIAGNOSIS REQUIRED)    Answer:   Left upper breast scar tissue nodule evaluation    Order Specific Question:   Preferred imaging location?    Answer:   [facility]    Order Specific Question:   Release to patient    Answer:   Immediate   The patient has a good understanding of the overall plan. she agrees with it. she will call with any problems that may develop before the next visit here. Total time spent: 30 mins including face to face time and time spent for planning, charting and co-ordination of care   Harriette Ohara, MD 11/10/21    I Gardiner Coins am scribing for Dr. Lindi Adie  I have reviewed the above documentation for accuracy and completeness, and I agree with the above.

## 2021-11-10 ENCOUNTER — Other Ambulatory Visit: Payer: Self-pay | Admitting: Hematology and Oncology

## 2021-11-10 ENCOUNTER — Other Ambulatory Visit: Payer: Self-pay

## 2021-11-10 ENCOUNTER — Inpatient Hospital Stay: Payer: Medicare PPO | Attending: Hematology and Oncology | Admitting: Hematology and Oncology

## 2021-11-10 DIAGNOSIS — Z170 Estrogen receptor positive status [ER+]: Secondary | ICD-10-CM | POA: Diagnosis not present

## 2021-11-10 DIAGNOSIS — Z853 Personal history of malignant neoplasm of breast: Secondary | ICD-10-CM | POA: Insufficient documentation

## 2021-11-10 DIAGNOSIS — C50212 Malignant neoplasm of upper-inner quadrant of left female breast: Secondary | ICD-10-CM | POA: Diagnosis not present

## 2021-11-10 NOTE — Assessment & Plan Note (Addendum)
09/28/2020:Screening mammogram showed a possible left breast mass. Diagnostic mammogram and US showed a 1.3cm left breast mass at the 11 o'clock position and no left axillary adenopathy. Biopsy showed invasive ductal carcinoma, grade 2, HER-2 equivocal by IHC, negative by FISH (ratio 1.26), ER/PR+ 90%, KI67 15%.  Treatment Plan: 1. 10/27/20 Breast conserving surgery: Grade 2 IDC, ER 90%, PR 9%, Her 2 Neg, Ki 67: 15% 2. Oncotype DX testing: Score 9 (3% ROR) 3. Adjuvant radiation therapy to complete 01/12/21 4. Adjuvant antiestrogen therapy with letrozole (refused) ------------------------------------------------------------------------------------------------------------------ Breast cancer surveillance: 1.  Breast exam 11/10/2021: Benign 2. mammogram 09/04/2021: Benign breast density category B  Palpable nodule left chest wall upper aspect along scar tissue: We will request an ultrasound evaluation to determine if this is benign.  If that comes back as benign then she can return to clinic in 1 year for follow-up

## 2021-11-15 ENCOUNTER — Ambulatory Visit
Admission: RE | Admit: 2021-11-15 | Discharge: 2021-11-15 | Disposition: A | Discharge status: Home / Self Care | Payer: Medicare PPO | Source: Ambulatory Visit | Attending: Hematology and Oncology | Admitting: Hematology and Oncology

## 2021-11-15 DIAGNOSIS — Z170 Estrogen receptor positive status [ER+]: Secondary | ICD-10-CM

## 2021-11-15 DIAGNOSIS — R928 Other abnormal and inconclusive findings on diagnostic imaging of breast: Secondary | ICD-10-CM | POA: Diagnosis not present

## 2021-11-15 DIAGNOSIS — Z853 Personal history of malignant neoplasm of breast: Secondary | ICD-10-CM | POA: Diagnosis not present

## 2021-11-15 DIAGNOSIS — N6002 Solitary cyst of left breast: Secondary | ICD-10-CM | POA: Diagnosis not present

## 2022-03-01 DIAGNOSIS — Z8541 Personal history of malignant neoplasm of cervix uteri: Secondary | ICD-10-CM | POA: Diagnosis not present

## 2022-03-01 DIAGNOSIS — Z9189 Other specified personal risk factors, not elsewhere classified: Secondary | ICD-10-CM | POA: Diagnosis not present

## 2022-03-01 DIAGNOSIS — Z9289 Personal history of other medical treatment: Secondary | ICD-10-CM | POA: Diagnosis not present

## 2022-03-01 DIAGNOSIS — L309 Dermatitis, unspecified: Secondary | ICD-10-CM | POA: Diagnosis not present

## 2022-03-01 DIAGNOSIS — M85852 Other specified disorders of bone density and structure, left thigh: Secondary | ICD-10-CM | POA: Diagnosis not present

## 2022-03-05 ENCOUNTER — Ambulatory Visit: Payer: Medicare PPO | Admitting: Cardiovascular Disease

## 2022-03-21 ENCOUNTER — Encounter: Payer: Self-pay | Admitting: Cardiovascular Disease

## 2022-03-21 NOTE — Progress Notes (Signed)
Cardiology Office Note    Date:  03/26/2022   ID:  Destiny Andersen, DOB 10-31-1945, MRN [id number]  PCP:  Destiny Amel, MD  Cardiologist:  Dr. Acie Fredrickson  Chief Complaint: s/p MV repair  History of Present Illness:   Destiny Andersen is a 76 y.o. female with a history of mitral valve prolapse with severe MR s/p minimally invasive mitral valve repair and PFO closure who walked in for heart racing.   The patient reportedly was told that she had mitral valve prolapse many years ago. Approximately 3 years ago she was noted by her primary care physician to have a prominent murmur on physical exam and echocardiogram revealed the presence of mitral valve prolapse with severe mitral regurgitation.  She has been followed carefully by Dr. Acie Fredrickson ever since.  She has remained physically active and reportedly asymptomatic. She was seen in follow-up recently by Dr. Acie Fredrickson and transthoracic echocardiogram revealed mitral valve prolapse with a flail segment of the posterior leaflet of mitral valve and severe mitral regurgitation. Left ventricular size and systolic function remains normal. Transesophageal echocardiogram was performed, confirming the presence of a flail segment of the posterior leaflet with ruptured chordae tendineae. L/RHC on 12/21/15 showed normal coronary arteries, normal LV function and severe MR. The patient was referred for surgical consultation.    She ultimately underwent minimally invasive mitral valve repair and PFO closure with Dr. Roxy Manns on 02/01/16. Her post op course was c/b post op thrombocytopenia and volume overload requiring diuresis. The patient was not discharged from the hospital on a beta blocker because of bradycardia during the first couple days after surgery.  Last seen in clinic 02/17/16 for  for follow up. She has been feeling pretty good since the surgery except her back hurts. EKG showed sinus tachycardia at rate of 103 bpm.  The patient was started on metorpolol 12.'5mg'$  BID  due to tachycardia by Dr. Clydene Laming 02/21/16.   Came for INR check and while walking back to car in parking lot she noted HR of 110 and came back for further evaluation. The patient denies nausea, vomiting, fever, chest pain, palpitations, shortness of breath, orthopnea, PND, dizziness, syncope, cough, congestion, abdominal pain, hematochezia, melena, lower extremity edema. Had one episode of throat sensation this morning.   Jan. 8, 2018:  Destiny Andersen is doing well.   we had increased her metoprolol slightly at her last visit  Wears her fit bit and her HR is much better  Doing cardiac rehab.   Doing well.    Has some scar tenderness and rib tenderness.   Aug. 31, 2018: Destiny Andersen is seen today for follow up of her MV repair and PFO closure.  Recent echo shows EF 55-60%.   , good repair of the MV . Mild TR Her incision has healed well It was tight ,   She used a silicone gel to help it heal .  Is walking 30 minutes a day.     Nov 12, 2019:  overal doing well Exercising 16-18 min a day  - limited by a bone spur.  Is seeing sport medicine   January 29, 2020: Destiny Andersen is seen today for follow-up visit.  She has been having creasing palpitations for the past several weeks.  She has had her upper respiratory tract infection.  She tested negative for Covid last week. Covid tested yesterday was also negative   Has not felt well for the past 2 weeks  Fatigued,  Taste is altered , sleeping lots Staying  hydrated.   Drinks plenty of fluids  On Wednesday , she was waking her dog,  Started having significant palpitations,  Very hard heart beats  Was wiped out  Recovered after 2-3 minutes.  Has not walked since then   Eating and drinking normally .  Has had orthostasis for the past 2 weeks.   Symptoms are c/w dehydration  - she drinks maybe 50 oz of water a day  No blood in stool, + fever - temp 99.9 today ,  99.7 yesterday   March 10, 2021: Destiny Andersen is seen today for a follow-up visit.  She has a history  of mitral valve prolapse, status post mitral valve repair. She was having some palpitations when I saw her last year.  [name] was found to have COVID  shortly after that visit She has been found to have breast cancer and XRT since I last saw her. Has had a 2nd COVID infection . Took  Paxlovid - tasted very bad,  not sure if she felt better with the Paxlovid.  Took the low dose of Paxlovid ( due to underlying CKD)  No rebound .  Had some prolonged dyspnea   following covid   Doing well from a cardiac standpoint .    Oct. 9, 2023 Destiny Andersen is seen today for follow up of her MVP , s/p MV repair  Feels well   Had Left breast lumpectomy last year .  Is doing well       Past Medical History:  Diagnosis Date   Breast cancer ([place]) 09/20/2020   Cancer ([place]) 1983   Cervical cancer.  Skin Cancer- leg   Chronic headaches    "? Migraines"- does not have then any more   Constipation    Elevated C-reactive protein (CRP)    Family history of breast cancer    Mother   Fatigue    Grover's disease 2022   Grover's disease    Heart murmur    ECHO 02/10/13 EF = 60-65%, moderate posterior mitral valve prolapse w/possible flail segment, severe MR, mitral regurgitant jet is anteriorly directed.   History of blood transfusion    pre-eclampsia    Hyperpotassemia    Migrated   Medicare annual wellness visit, subsequent    Mitral valve prolapse    MR (mitral regurgitation)    ECHO 02/10/13 EF = 60-65%, moderate posterior mitral valve prolapse w/possible flail segment, severe MR, mitral regurgitant jet is anteriorly directed.   Risk for falls    Routine general medical examination at a health care facility    S/P minimally invasive mitral valve repair 02/01/2016   Complex valvuloplasty including triangular resection of flail posterior leaflet, artificial Gore-tex neochord placement x10 and 28 mm Sorin Memo 3D ring annuloplasty via right mini thoracotomy approach   Seizures ([place])    with pre- echa    Shortness of breath dyspnea    with exertion    Past Surgical History:  Procedure Laterality Date   BREAST BIOPSY Right 1998   benign cyst   BREAST LUMPECTOMY WITH RADIOACTIVE SEED LOCALIZATION Left 10/27/2020   Procedure: LEFT BREAST LUMPECTOMY WITH RADIOACTIVE SEED LOCALIZATION;  Surgeon: Coralie Keens, MD;  Location: [facility];  Service: General;  Laterality: Left;   CARDIAC CATHETERIZATION N/A 12/21/2015   Procedure: Right/Left Heart Cath and Coronary Angiography;  Surgeon: Sherren Mocha, MD;  Location: [facility];  Service: Cardiovascular;  Laterality: N/A;   COLONOSCOPY  08/2001   Colon cancer screening in 01/2012  LAPAROSCOPIC CHOLECYSTECTOMY  1990   left knee arthroscopic surgery Left 05/29/2018   MITRAL VALVE REPAIR Right 02/01/2016   Procedure: MINIMALLY INVASIVE MITRAL VALVE REPAIR (MVR) with closure of PFO;  Surgeon: Rexene Alberts, MD;  Location: [facility];  Service: Open Heart Surgery;  Laterality: Right;   TEE WITHOUT CARDIOVERSION N/A 10/14/2015   Procedure: TRANSESOPHAGEAL ECHOCARDIOGRAM (TEE);  Surgeon: Thayer Headings, MD;  Location: [facility];  Service: Cardiovascular;  Laterality: N/A;   TEE WITHOUT CARDIOVERSION N/A 02/01/2016   Procedure: TRANSESOPHAGEAL ECHOCARDIOGRAM (TEE);  Surgeon: Rexene Alberts, MD;  Location: [facility];  Service: Open Heart Surgery;  Laterality: N/A;   TOTAL ABDOMINAL HYSTERECTOMY  1983   Cervical cancer 1983    Current Medications: Prior to Admission medications   Medication Sig Start Date End Date Taking? Authorizing Provider  acetaminophen (TYLENOL) 325 MG tablet Take 650 mg by mouth every 6 (six) hours as needed.    Historical Provider, MD  aspirin 81 MG tablet Take 81 mg by mouth daily.    Historical Provider, MD  calcium citrate-vitamin D (CITRACAL+D) 315-200 MG-UNIT tablet Take 1 tablet by mouth 3 (three) times daily. VITAMIN D DOSAGE IS '250MG'$  INSTEAD OF 200    Historical Provider, MD  diclofenac sodium  (VOLTAREN) 1 % GEL Apply 2 g topically as needed (back pain). 02/07/16   Donielle Liston Alba, PA-C  metoprolol tartrate (LOPRESSOR) 25 MG tablet Take 0.5 tablets (12.5 mg total) by mouth 2 (two) times daily. 02/21/16   Rexene Alberts, MD  traMADol (ULTRAM) 50 MG tablet Take 1 tablet (50 mg total) by mouth every 6 (six) hours as needed for moderate pain. 02/07/16   Donielle Liston Alba, PA-C  warfarin (COUMADIN) 2.5 MG tablet Take 1 to 1.5 tablets by mouth daily as directed by coumadin clinic 03/09/16   Thayer Headings, MD    Allergies:   Penicillins, Codone [hydrocodone], Oxycodone-acetaminophen, Codeine, Doxycycline hyclate, and Ultram [tramadol]   Social History   Socioeconomic History   Marital status: Married    Spouse name: Not on file   Number of children: 1   Years of education: Not on file   Highest education level: Not on file  Occupational History   Not on file  Tobacco Use   Smoking status: Former    Years: 25.00    Types: Cigarettes    Quit date: 06/18/1985    Years since quitting: 36.7   Smokeless tobacco: Never  Substance and Sexual Activity   Alcohol use: Not Currently   Drug use: No   Sexual activity: Never  Other Topics Concern   Not on file  Social History Narrative   Not on file   Social Determinants of Health   Financial Resource Strain: Low Risk  (04/13/2021)   Overall Financial Resource Strain (CARDIA)    Difficulty of Paying Living Expenses: Not hard at all  Food Insecurity: No Food Insecurity (04/13/2021)   Hunger Vital Sign    Worried About Running Out of Food in the Last Year: Never true    [place] in the Last Year: Never true  Transportation Needs: No Transportation Needs (04/13/2021)   PRAPARE - Hydrologist (Medical): No    Lack of Transportation (Non-Medical): No  Physical Activity: Sufficiently Active (04/13/2021)   Exercise Vital Sign    Days of Exercise per Week: 7 days    Minutes of Exercise per  Session: 60 min  Stress: No Stress Concern Present (  04/13/2021)   [place]    Feeling of Stress : Not at all  Social Connections: Moderately Integrated (04/13/2021)   Social Connection and Isolation Panel [NHANES]    Frequency of Communication with Friends and Family: More than three times a week    Frequency of Social Gatherings with Friends and Family: More than three times a week    Attends Religious Services: Never    Marine scientist or Organizations: Yes    Attends Music therapist: 1 to 4 times per year    Marital Status: Married     Family History:  The patient's family history includes AAA (abdominal aortic aneurysm) in her father; Breast cancer (age of onset: 62) in her mother; CAD in her father and mother; Heart disease in her father and mother; High Cholesterol in her brother; Hypertension in her brother, father, and mother; Skin cancer in her brother.   ROS:   Please see the history of present illness.    ROS All other systems reviewed and are negative.   PHYSICAL EXAM:     Physical Exam: Blood pressure 118/84, pulse 84, height 5' 4.5" (1.638 m), weight 131 lb (59.4 kg), SpO2 96 %.      GEN:  Well nourished, well developed in no acute distress HEENT: Normal NECK: No JVD; No carotid bruits LYMPHATICS: No lymphadenopathy CARDIAC: RRR , no significant murmur  RESPIRATORY:  Clear to auscultation without rales, wheezing or rhonchi  ABDOMEN: Soft, non-tender, non-distended MUSCULOSKELETAL:  No edema; No deformity  SKIN: Warm and dry NEUROLOGIC:  Alert and oriented x 3   ECG:  Oct. 9, 2023:  NSR at 84.   Normal ecg   Wt Readings from Last 3 Encounters:  03/26/22 131 lb (59.4 kg)  11/10/21 129 lb (58.5 kg)  04/13/21 130 lb 8 oz (59.2 kg)      Studies/Labs Reviewed:      Recent Labs: No results found for requested labs within last 365 days.   Lipid Panel No results  found for: "CHOL", "TRIG", "HDL", "CHOLHDL", "VLDL", "LDLCALC", "LDLDIRECT"  Additional studies/ records that were reviewed today include:   As above  ASSESSMENT & PLAN:     . Mitral valve prolapse with severe MR: s/P MV repair .  Overall is doing very well.   No dyspnea.  Is maintaining NSR   Continue current meds.  Will see her in 1 year .      Signed, Mertie Moores, MD  03/26/2022 1:44 PM    [facility] [address], [place], [place]  [address] Phone: [phone]; Fax: [phone]

## 2022-03-26 ENCOUNTER — Ambulatory Visit: Payer: Medicare PPO | Attending: Cardiovascular Disease | Admitting: Cardiovascular Disease

## 2022-03-26 ENCOUNTER — Encounter: Payer: Self-pay | Admitting: Cardiovascular Disease

## 2022-03-26 VITALS — BP 118/84 | HR 84 | Ht 64.5 in | Wt 131.0 lb

## 2022-03-26 DIAGNOSIS — I341 Nonrheumatic mitral (valve) prolapse: Secondary | ICD-10-CM | POA: Diagnosis not present

## 2022-03-26 MED ORDER — METOPROLOL TARTRATE 25 MG PO TABS
ORAL_TABLET | ORAL | 3 refills | Status: DC
Start: 2022-03-26 — End: 2023-03-26

## 2022-03-26 NOTE — Patient Instructions (Signed)
Medication Instructions:  NO CHANGES *If you need a refill on your cardiac medications before your next appointment, please call your pharmacy*   Lab Work: NONE If you have labs (blood work) drawn today and your tests are completely normal, you will receive your results only by: MyChart Message (if you have MyChart) OR A paper copy in the mail If you have any lab test that is abnormal or we need to change your treatment, we will call you to review the results.   Testing/Procedures: NONE   Follow-Up: At [facility], you and your health needs are our priority.  As part of our continuing mission to provide you with exceptional heart care, we have created designated Provider Care Teams.  These Care Teams include your primary Cardiologist (physician) and Advanced Practice Providers (APPs -  Physician Assistants and Nurse Practitioners) who all work together to provide you with the care you need, when you need it.  We recommend signing up for the patient portal called "MyChart".  Sign up information is provided on this After Visit Summary.  MyChart is used to connect with patients for Virtual Visits (Telemedicine).  Patients are able to view lab/test results, encounter notes, upcoming appointments, etc.  Non-urgent messages can be sent to your provider as well.   To learn more about what you can do with MyChart, go to [email or web address].    Your next appointment:   1 year(s)  The format for your next appointment:   In Person  Provider:   Philip [name], MD     Other Instructions NONE  Important Information About Sugar       

## 2022-05-25 DIAGNOSIS — N1831 Chronic kidney disease, stage 3a: Secondary | ICD-10-CM | POA: Diagnosis not present

## 2022-05-25 DIAGNOSIS — D649 Anemia, unspecified: Secondary | ICD-10-CM | POA: Diagnosis not present

## 2022-05-25 DIAGNOSIS — E7800 Pure hypercholesterolemia, unspecified: Secondary | ICD-10-CM | POA: Diagnosis not present

## 2022-05-25 DIAGNOSIS — Z79899 Other long term (current) drug therapy: Secondary | ICD-10-CM | POA: Diagnosis not present

## 2022-05-29 DIAGNOSIS — E7800 Pure hypercholesterolemia, unspecified: Secondary | ICD-10-CM | POA: Diagnosis not present

## 2022-05-29 DIAGNOSIS — C50212 Malignant neoplasm of upper-inner quadrant of left female breast: Secondary | ICD-10-CM | POA: Diagnosis not present

## 2022-05-29 DIAGNOSIS — Z0001 Encounter for general adult medical examination with abnormal findings: Secondary | ICD-10-CM | POA: Diagnosis not present

## 2022-05-29 DIAGNOSIS — N1832 Chronic kidney disease, stage 3b: Secondary | ICD-10-CM | POA: Diagnosis not present

## 2022-05-29 DIAGNOSIS — Z9889 Other specified postprocedural states: Secondary | ICD-10-CM | POA: Diagnosis not present

## 2022-05-29 DIAGNOSIS — D72819 Decreased white blood cell count, unspecified: Secondary | ICD-10-CM | POA: Diagnosis not present

## 2022-05-29 DIAGNOSIS — Z79899 Other long term (current) drug therapy: Secondary | ICD-10-CM | POA: Diagnosis not present

## 2022-05-29 DIAGNOSIS — Z8541 Personal history of malignant neoplasm of cervix uteri: Secondary | ICD-10-CM | POA: Diagnosis not present

## 2022-05-31 DIAGNOSIS — S0501XA Injury of conjunctiva and corneal abrasion without foreign body, right eye, initial encounter: Secondary | ICD-10-CM | POA: Diagnosis not present

## 2022-06-04 IMAGING — MG MM BREAST LOCALIZATION CLIP
2 series · 3 of 6 positions shown · non-contrast
Comparison: Previous exam(s).

CLINICAL DATA: Status post ultrasound-guided radioactive seed
placement within a recently biopsied mass in 11 o'clock position of
the left breast containing a heart shaped biopsy marker clip.

EXAM:
DIAGNOSTIC LEFT MAMMOGRAM POST ULTRASOUND-GUIDED RADIOACTIVE SEED
PLACEMENT

[L ML synth-2D]
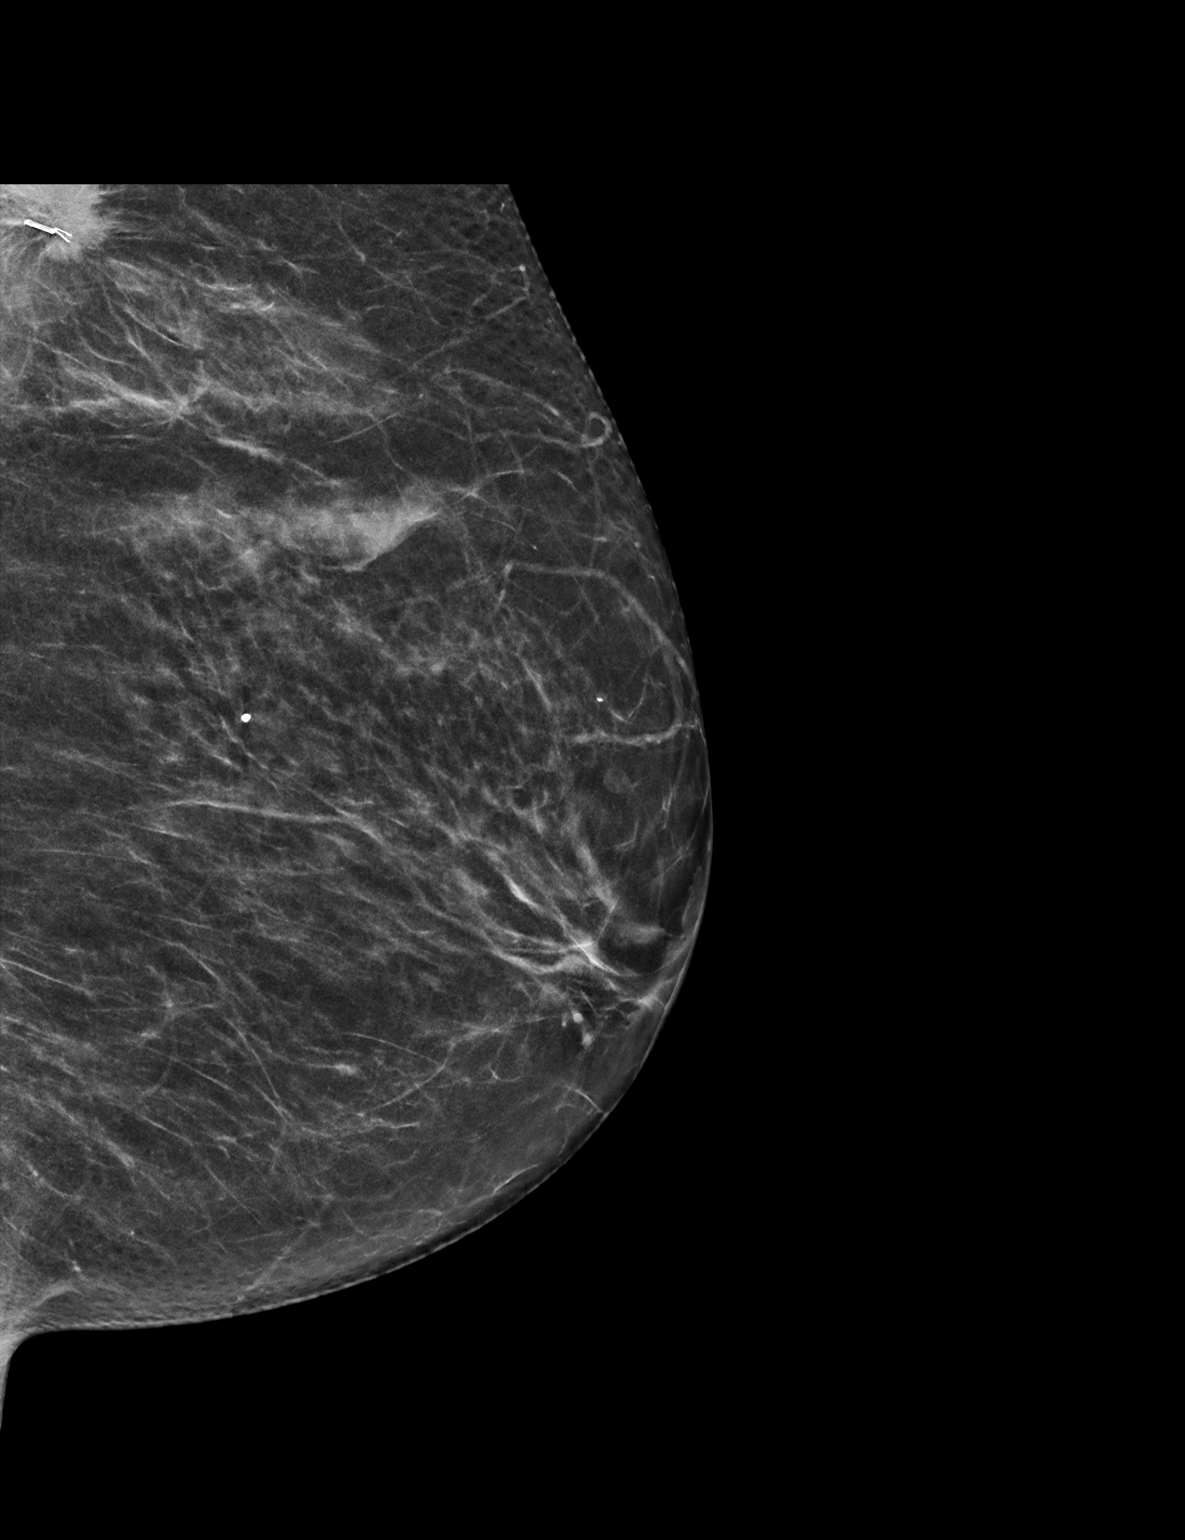

[L ML tomo · 2 of 59 frames shown]
[frame 20/59]
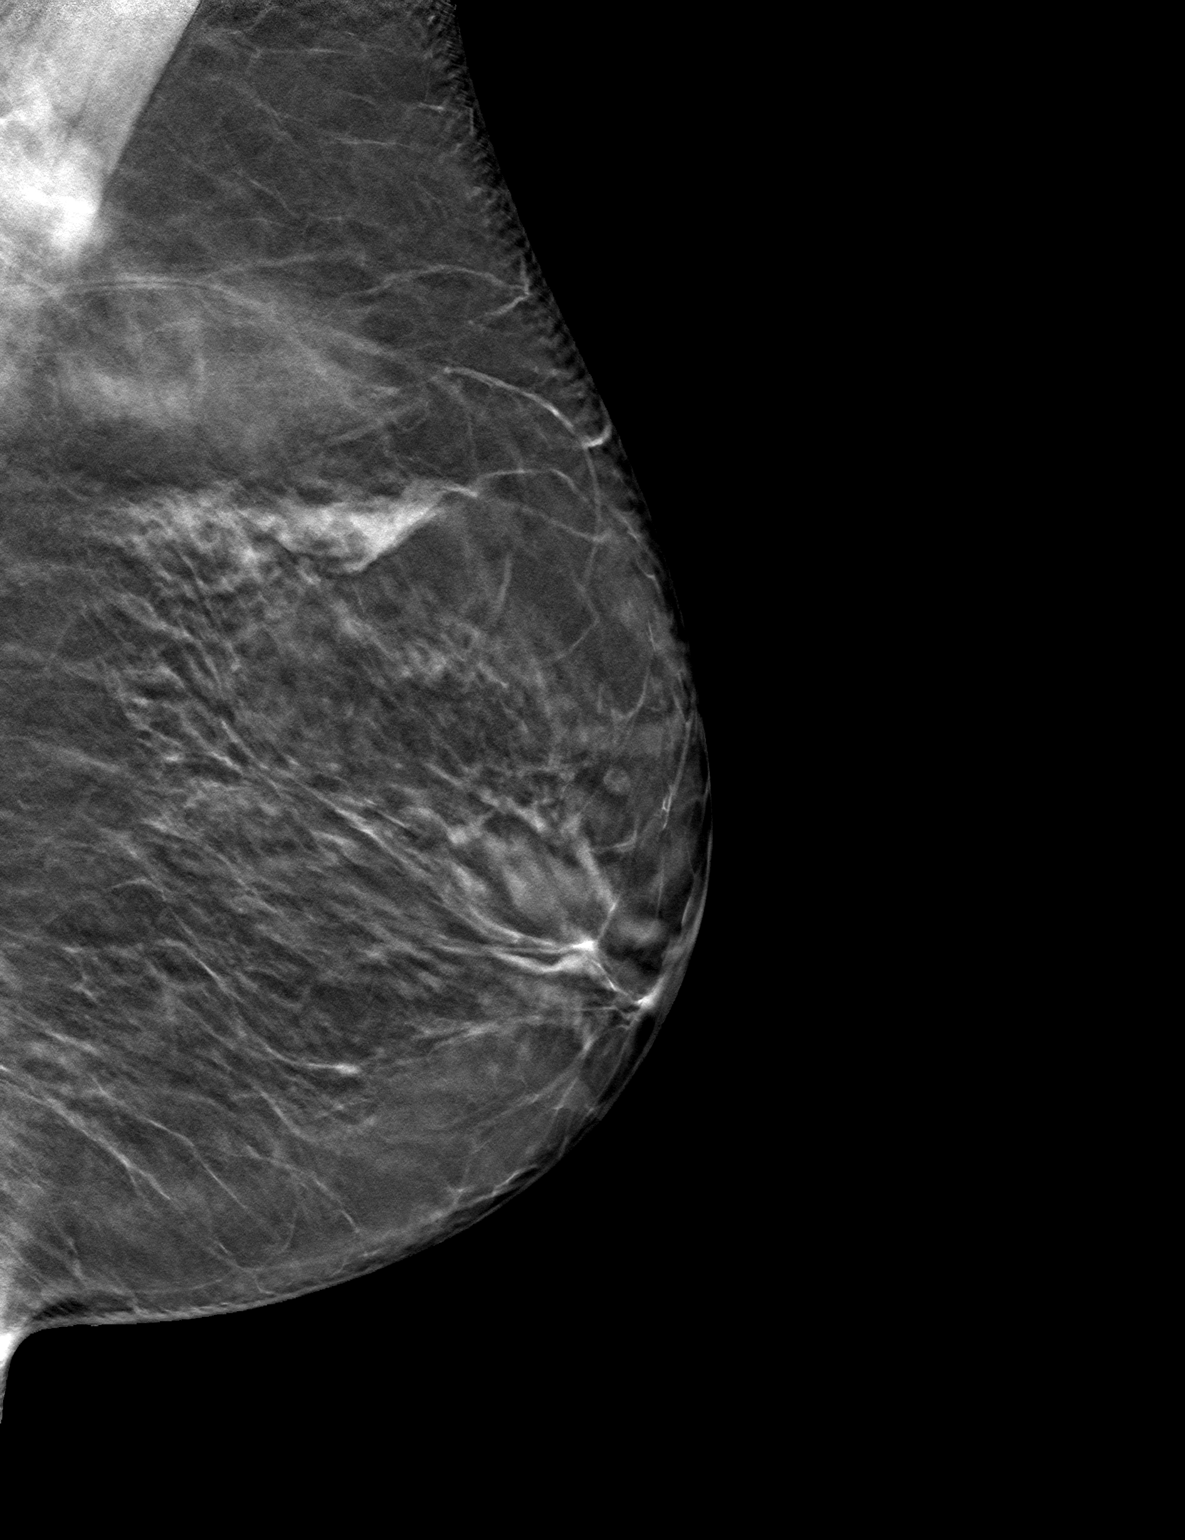
[frame 30/59]
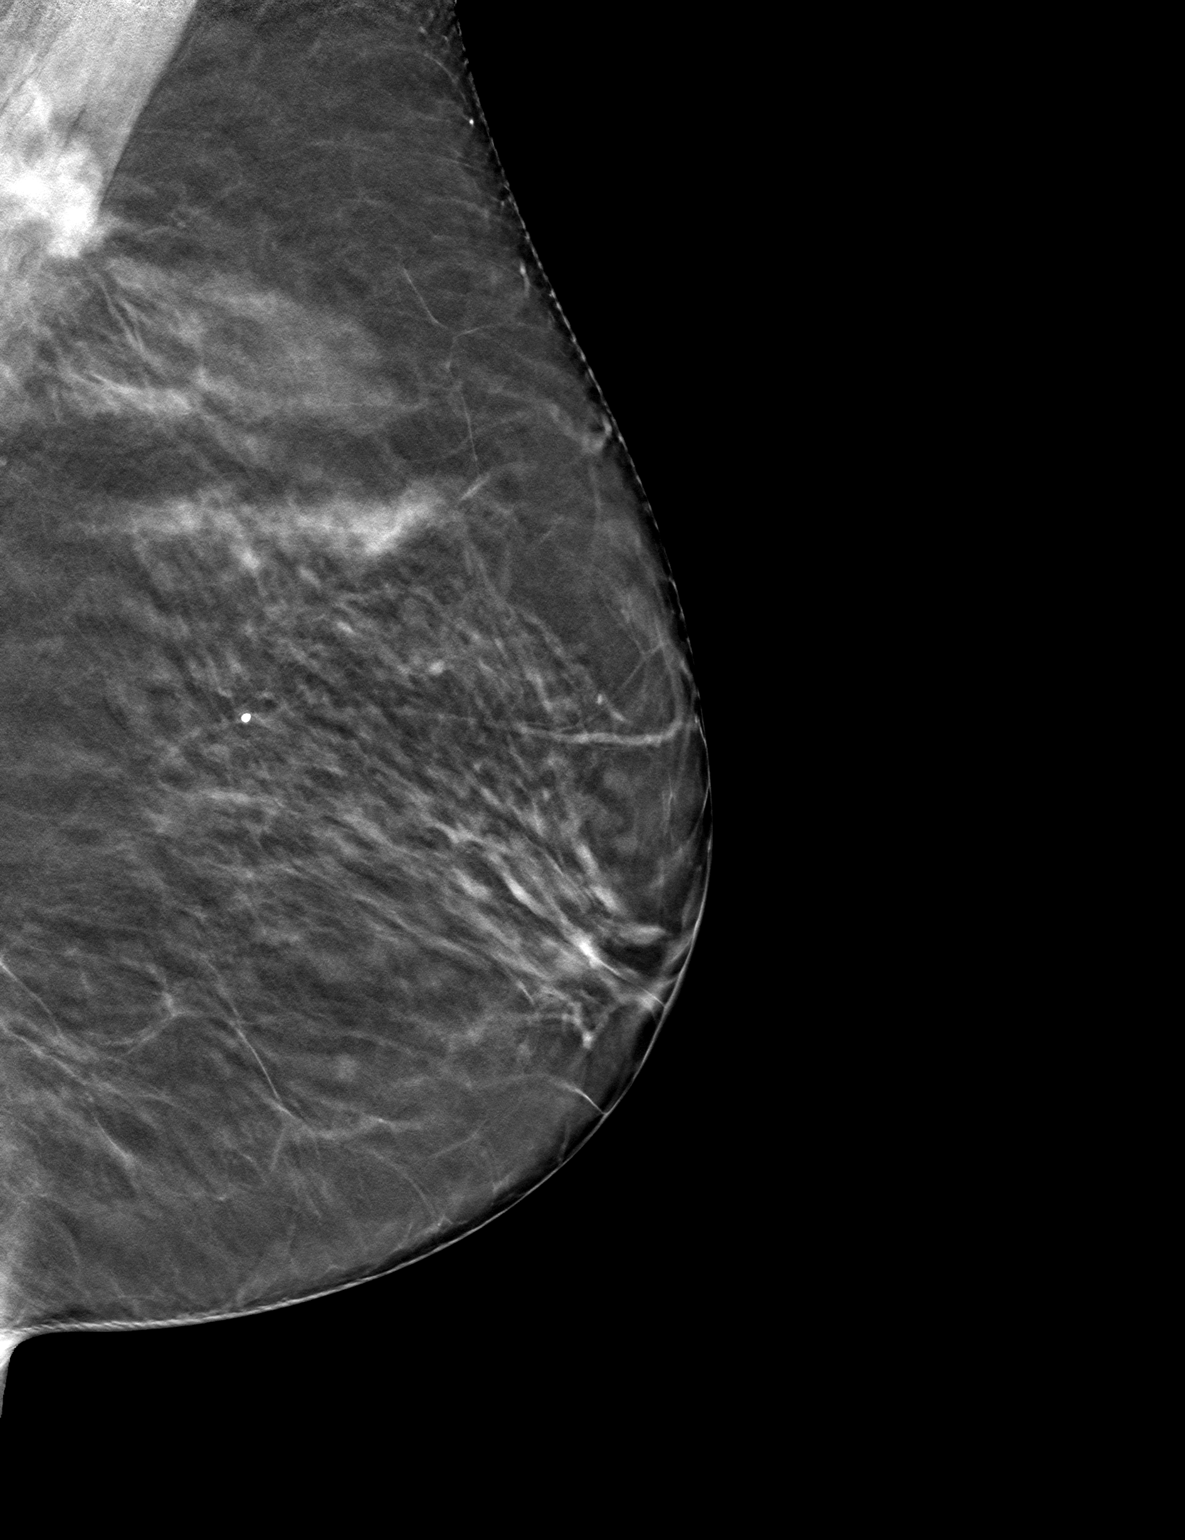

[3 of 6 positions shown; findings below may reference images not displayed]

FINDINGS: Mammographic images were obtained following ultrasound-guided
radioactive seed placement. These demonstrate the radioactive seed
within the inferior aspect of the biopsied left breast mass,
adjacent to the heart shaped biopsy marker clip.
IMPRESSION: Appropriate location of the radioactive seed.

Final Assessment: Post Procedure Mammograms for Seed Placement

## 2022-06-07 DIAGNOSIS — S0531XA Ocular laceration without prolapse or loss of intraocular tissue, right eye, initial encounter: Secondary | ICD-10-CM | POA: Diagnosis not present

## 2022-06-19 ENCOUNTER — Other Ambulatory Visit: Payer: Self-pay | Admitting: Hematology and Oncology

## 2022-06-19 DIAGNOSIS — Z853 Personal history of malignant neoplasm of breast: Secondary | ICD-10-CM

## 2022-06-20 DIAGNOSIS — L723 Sebaceous cyst: Secondary | ICD-10-CM | POA: Diagnosis not present

## 2022-06-20 DIAGNOSIS — L718 Other rosacea: Secondary | ICD-10-CM | POA: Diagnosis not present

## 2022-06-20 DIAGNOSIS — Z85828 Personal history of other malignant neoplasm of skin: Secondary | ICD-10-CM | POA: Diagnosis not present

## 2022-06-20 DIAGNOSIS — D225 Melanocytic nevi of trunk: Secondary | ICD-10-CM | POA: Diagnosis not present

## 2022-06-20 DIAGNOSIS — L821 Other seborrheic keratosis: Secondary | ICD-10-CM | POA: Diagnosis not present

## 2022-08-29 DIAGNOSIS — D72819 Decreased white blood cell count, unspecified: Secondary | ICD-10-CM | POA: Diagnosis not present

## 2022-09-07 ENCOUNTER — Ambulatory Visit
Admission: RE | Admit: 2022-09-07 | Discharge: 2022-09-07 | Disposition: A | Discharge status: Home / Self Care | Payer: Medicare PPO | Source: Ambulatory Visit | Attending: Hematology and Oncology | Admitting: Hematology and Oncology

## 2022-09-07 DIAGNOSIS — R928 Other abnormal and inconclusive findings on diagnostic imaging of breast: Secondary | ICD-10-CM | POA: Diagnosis not present

## 2022-09-07 DIAGNOSIS — Z853 Personal history of malignant neoplasm of breast: Secondary | ICD-10-CM

## 2022-09-26 DIAGNOSIS — D3132 Benign neoplasm of left choroid: Secondary | ICD-10-CM | POA: Diagnosis not present

## 2022-09-26 DIAGNOSIS — Z961 Presence of intraocular lens: Secondary | ICD-10-CM | POA: Diagnosis not present

## 2022-10-15 DIAGNOSIS — Z1211 Encounter for screening for malignant neoplasm of colon: Secondary | ICD-10-CM | POA: Diagnosis not present

## 2022-10-15 DIAGNOSIS — K635 Polyp of colon: Secondary | ICD-10-CM | POA: Diagnosis not present

## 2022-10-15 DIAGNOSIS — K648 Other hemorrhoids: Secondary | ICD-10-CM | POA: Diagnosis not present

## 2022-10-15 DIAGNOSIS — K5730 Diverticulosis of large intestine without perforation or abscess without bleeding: Secondary | ICD-10-CM | POA: Diagnosis not present

## 2022-10-18 DIAGNOSIS — K635 Polyp of colon: Secondary | ICD-10-CM | POA: Diagnosis not present

## 2022-10-26 ENCOUNTER — Telehealth: Payer: Self-pay | Admitting: Hematology and Oncology

## 2022-10-26 NOTE — Telephone Encounter (Signed)
Rescheduled appointment per room resource. Left voicemail.  

## 2022-10-31 ENCOUNTER — Telehealth: Payer: Self-pay | Admitting: Hematology and Oncology

## 2022-11-13 ENCOUNTER — Ambulatory Visit: Payer: Medicare PPO | Admitting: Hematology and Oncology

## 2022-11-21 NOTE — Progress Notes (Signed)
Patient Care Team: Darrow Bussing, MD as PCP - General (Family Medicine) [name], Deloris Ping, MD as PCP - Cardiology (Cardiology) Dorothy Puffer, MD as Consulting Physician (Radiation Oncology) Serena Croissant, MD as Consulting Physician (Hematology and Oncology) Abigail Miyamoto, MD as Consulting Physician (General Surgery)  DIAGNOSIS:  Encounter Diagnosis  Name Primary?   Malignant neoplasm of upper-inner quadrant of left breast in female, estrogen receptor positive (HCC) Yes    SUMMARY OF ONCOLOGIC HISTORY: Oncology History  Malignant neoplasm of upper-inner quadrant of left breast in female, estrogen receptor positive (HCC)  09/20/2020 Cancer Staging   Staging form: Breast, AJCC 8th Edition - Clinical stage from 09/20/2020: Stage IA (cT1c, cN0, cM0, G2, ER+, PR+, HER2-) - Signed by Loa Socks, NP on 09/28/2020 Stage prefix: Initial diagnosis Histologic grading system: 3 grade system   09/28/2020 Initial Diagnosis   Screening mammogram showed a possible left breast mass. Diagnostic mammogram and US showed a 1.3cm left breast mass at the 11 o'clock position and no left axillary adenopathy. Biopsy showed invasive ductal carcinoma, grade 2, HER-2 equivocal by IHC, negative by FISH (ratio 1.26), ER/PR+ 90%, KI67 15%.    10/27/2020 Surgery   Left lumpectomy Magnus Ivan): IDC, 1.5cm, clear margins   10/27/2020 Oncotype testing   Score: 9 (ROR 3%)   12/15/2020 - 01/12/2021 Radiation Therapy   Adj XRT     CHIEF COMPLIANT: Follow-up of left breast cancer    INTERVAL HISTORY: Destiny Andersen is a  77 y.o. with above-mentioned history of left breast cancer having undergone a lumpectomy. She reports to the clinic today for follow-up. She reports health is fine and she is doing very well. Denies any pain or discomfort in breast. She does walks and work in her yard.  ALLERGIES:  is allergic to penicillins, codone [hydrocodone], oxycodone-acetaminophen, codeine, doxycycline hyclate, and  ultram [tramadol].  MEDICATIONS:  Current Outpatient Medications  Medication Sig Dispense Refill   aspirin 81 MG tablet Take 81 mg by mouth daily.     Biotin 1 MG CAPS Take 1 capsule by mouth daily. 30 capsule    calcium citrate-vitamin D (CITRACAL+D) 315-200 MG-UNIT tablet Take 1 tablet by mouth 2 (two) times daily. VITAMIN D DOSAGE IS 250MG  INSTEAD OF 200     cholecalciferol (VITAMIN D3) 25 MCG (1000 UNIT) tablet Take 1,000 Units by mouth daily.     clindamycin (CLEOCIN) 300 MG capsule TAKE 2 CAPSULES BY MOUTH AS DIRECTED 1 HOUR PRIOR TO DENTAL WORK 2 capsule 0   clotrimazole-betamethasone (LOTRISONE) cream Apply 1 application. topically as needed.     metoprolol tartrate (LOPRESSOR) 25 MG tablet TAKE 1/2 TABLET BY MOUTH EVERY MORNING THEN TAKE 1 TABLET BY MOUTH EVERY EVENING 135 tablet 3   No current facility-administered medications for this visit.    PHYSICAL EXAMINATION: ECOG PERFORMANCE STATUS: 1 - Symptomatic but completely ambulatory  Vitals:   12/04/22 1042  BP: 122/69  Pulse: 62  Resp: 18  Temp: (!) 97.5 F (36.4 C)  SpO2: 98%   Filed Weights   12/04/22 1042  Weight: 129 lb 9.6 oz (58.8 kg)    BREAST: No palpable masses or nodules in either right or left breasts. No palpable axillary supraclavicular or infraclavicular adenopathy no breast tenderness or nipple discharge. (exam performed in the presence of a chaperone)  LABORATORY DATA:  I have reviewed the data as listed    Latest Ref Rng & Units 03/11/2020   11:05 AM 01/29/2020    2:22 PM 02/05/2016  3:08 AM  CMP  Glucose 65 - 99 mg/dL 77  161  096   BUN 8 - 27 mg/dL 17  16  9    Creatinine 0.57 - 1.00 mg/dL 0.45  4.09  8.11   Sodium 134 - 144 mmol/L 138  132  141   Potassium 3.5 - 5.2 mmol/L 4.4  4.3  4.4   Chloride 96 - 106 mmol/L 102  96  106   CO2 20 - 29 mmol/L 26  24  28    Calcium 8.7 - 10.3 mg/dL 9.9  9.6  9.0     Lab Results  Component Value Date   WBC 6.2 01/29/2020   HGB 11.9 01/29/2020    HCT 33.7 (L) 01/29/2020   MCV 96 01/29/2020   PLT 273 01/29/2020   NEUTROABS 3.5 01/29/2020    ASSESSMENT & PLAN:  Malignant neoplasm of upper-inner quadrant of left breast in female, estrogen receptor positive (HCC) 09/28/2020:Screening mammogram showed a possible left breast mass. Diagnostic mammogram and US showed a 1.3cm left breast mass at the 11 o'clock position and no left axillary adenopathy. Biopsy showed invasive ductal carcinoma, grade 2, HER-2 equivocal by IHC, negative by FISH (ratio 1.26), ER/PR+ 90%, KI67 15%.   Treatment Plan: 1. 10/27/20 Breast conserving surgery: Grade 2 IDC, ER 90%, PR 9%, Her 2 Neg, Ki 67: 15% 2. Oncotype DX testing: Score 9 (3% ROR) 3. Adjuvant radiation therapy to complete 01/12/21 4. Adjuvant antiestrogen therapy with letrozole (refused) ------------------------------------------------------------------------------------------------------------------ Breast cancer surveillance: 1.  Breast exam 11/10/2021: Benign 2. mammogram 09/07/2022: Benign breast density category B 3.  Left breast ultrasound 11/15/2021: Oval cyst 8 mm fat necrosis   Return to clinic in 1 year for follow-up    No orders of the defined types were placed in this encounter.  The patient has a good understanding of the overall plan. she agrees with it. she will call with any problems that may develop before the next visit here. Total time spent: 30 mins including face to face time and time spent for planning, charting and co-ordination of care   Tamsen Meek, MD 12/04/22    I Janan Ridge am acting as a Neurosurgeon for [employer]  I have reviewed the above documentation for accuracy and completeness, and I agree with the above.

## 2022-11-23 ENCOUNTER — Ambulatory Visit: Payer: Medicare PPO | Admitting: Hematology and Oncology

## 2022-12-04 ENCOUNTER — Inpatient Hospital Stay: Payer: Medicare PPO | Attending: Hematology and Oncology | Admitting: Hematology and Oncology

## 2022-12-04 ENCOUNTER — Other Ambulatory Visit: Payer: Self-pay

## 2022-12-04 VITALS — BP 122/69 | HR 62 | Temp 97.5°F | Resp 18 | Ht 64.5 in | Wt 129.6 lb

## 2022-12-04 DIAGNOSIS — Z170 Estrogen receptor positive status [ER+]: Secondary | ICD-10-CM

## 2022-12-04 DIAGNOSIS — C50212 Malignant neoplasm of upper-inner quadrant of left female breast: Secondary | ICD-10-CM

## 2022-12-04 NOTE — Assessment & Plan Note (Signed)
09/28/2020:Screening mammogram showed a possible left breast mass. Diagnostic mammogram and US showed a 1.3cm left breast mass at the 11 o'clock position and no left axillary adenopathy. Biopsy showed invasive ductal carcinoma, grade 2, HER-2 equivocal by IHC, negative by FISH (ratio 1.26), ER/PR+ 90%, KI67 15%.   Treatment Plan: 1. 10/27/20 Breast conserving surgery: Grade 2 IDC, ER 90%, PR 9%, Her 2 Neg, Ki 67: 15% 2. Oncotype DX testing: Score 9 (3% ROR) 3. Adjuvant radiation therapy to complete 01/12/21 4. Adjuvant antiestrogen therapy with letrozole (refused) ------------------------------------------------------------------------------------------------------------------ Breast cancer surveillance: 1.  Breast exam 11/10/2021: Benign 2. mammogram 09/07/2022: Benign breast density category B 3.  Left breast ultrasound 11/15/2021: Oval cyst 8 mm fat necrosis   Return to clinic in 1 year for follow-up

## 2023-02-05 DIAGNOSIS — N1831 Chronic kidney disease, stage 3a: Secondary | ICD-10-CM | POA: Diagnosis not present

## 2023-02-05 DIAGNOSIS — U071 COVID-19: Secondary | ICD-10-CM | POA: Diagnosis not present

## 2023-03-23 ENCOUNTER — Other Ambulatory Visit: Payer: Self-pay | Admitting: Cardiovascular Disease

## 2023-03-25 ENCOUNTER — Encounter: Payer: Self-pay | Admitting: Cardiovascular Disease

## 2023-03-25 NOTE — Progress Notes (Unsigned)
Cardiology Office Note    Date:  03/26/2023   ID:  Destiny Andersen, DOB 12-04-1945, MRN [id number]  PCP:  Darrow Bussing, MD  Cardiologist:  Dr. Elease Hashimoto  Chief Complaint: s/p MV repair  History of Present Illness:   Destiny Andersen is a 77 y.o. female with a history of mitral valve prolapse with severe MR s/p minimally invasive mitral valve repair and PFO closure who walked in for heart racing.   The patient reportedly was told that she had mitral valve prolapse many years ago. Approximately 3 years ago she was noted by her primary care physician to have a prominent murmur on physical exam and echocardiogram revealed the presence of mitral valve prolapse with severe mitral regurgitation.  She has been followed carefully by Dr. Elease Hashimoto ever since.  She has remained physically active and reportedly asymptomatic. She was seen in follow-up recently by Dr. Elease Hashimoto and transthoracic echocardiogram revealed mitral valve prolapse with a flail segment of the posterior leaflet of mitral valve and severe mitral regurgitation. Left ventricular size and systolic function remains normal. Transesophageal echocardiogram was performed, confirming the presence of a flail segment of the posterior leaflet with ruptured chordae tendineae. L/RHC on 12/21/15 showed normal coronary arteries, normal LV function and severe MR. The patient was referred for surgical consultation.    She ultimately underwent minimally invasive mitral valve repair and PFO closure with Dr. Cornelius Moras on 02/01/16. Her post op course was c/b post op thrombocytopenia and volume overload requiring diuresis. The patient was not discharged from the hospital on a beta blocker because of bradycardia during the first couple days after surgery.  Last seen in clinic 02/17/16 for  for follow up. She has been feeling pretty good since the surgery except her back hurts. EKG showed sinus tachycardia at rate of 103 bpm.  The patient was started on metorpolol 12.5mg  BID  due to tachycardia by Dr. Porfirio Oar 02/21/16.   Came for INR check and while walking back to car in parking lot she noted HR of 110 and came back for further evaluation. The patient denies nausea, vomiting, fever, chest pain, palpitations, shortness of breath, orthopnea, PND, dizziness, syncope, cough, congestion, abdominal pain, hematochezia, melena, lower extremity edema. Had one episode of throat sensation this morning.   Jan. 8, 2018:  Destiny Andersen is doing well.   we had increased her metoprolol slightly at her last visit  Wears her fit bit and her HR is much better  Doing cardiac rehab.   Doing well.    Has some scar tenderness and rib tenderness.   Aug. 31, 2018: Destiny Andersen is seen today for follow up of her MV repair and PFO closure.  Recent echo shows EF 55-60%.   , good repair of the MV . Mild TR Her incision has healed well It was tight ,   She used a silicone gel to help it heal .  Is walking 30 minutes a day.     Nov 12, 2019:  overal doing well Exercising 16-18 min a day  - limited by a bone spur.  Is seeing sport medicine   January 29, 2020: Destiny Andersen is seen today for follow-up visit.  She has been having creasing palpitations for the past several weeks.  She has had her upper respiratory tract infection.  She tested negative for Covid last week. Covid tested yesterday was also negative   Has not felt well for the past 2 weeks  Fatigued,  Taste is altered , sleeping lots Staying  hydrated.   Drinks plenty of fluids  On Wednesday , she was waking her dog,  Started having significant palpitations,  Very hard heart beats  Was wiped out  Recovered after 2-3 minutes.  Has not walked since then   Eating and drinking normally .  Has had orthostasis for the past 2 weeks.   Symptoms are c/w dehydration  - she drinks maybe 50 oz of water a day  No blood in stool, + fever - temp 99.9 today ,  99.7 yesterday   March 10, 2021: Destiny Andersen is seen today for a follow-up visit.  She has a history  of mitral valve prolapse, status post mitral valve repair. She was having some palpitations when I saw her last year.  Destiny Andersen was found to have COVID  shortly after that visit She has been found to have breast cancer and XRT since I last saw her. Has had a 2nd COVID infection . Took  Paxlovid - tasted very bad,  not sure if she felt better with the Paxlovid.  Took the low dose of Paxlovid ( due to underlying CKD)  No rebound .  Had some prolonged dyspnea   following covid   Doing well from a cardiac standpoint .    Oct. 9, 2023 Destiny Andersen is seen today for follow up of her MVP , s/p MV repair  Feels well   Had Left breast lumpectomy last year .  Is doing well    Oct. 8, 2024 Destiny Andersen is seen for follow up of her MVP and MV repair.     Past Medical History:  Diagnosis Date   Breast cancer (HCC) 09/20/2020   Cancer (HCC) 1983   Cervical cancer.  Skin Cancer- leg   Chronic headaches    "? Migraines"- does not have then any more   Constipation    Elevated C-reactive protein (CRP)    Family history of breast cancer    Mother   Fatigue    Grover's disease 2022   Grover's disease    Heart murmur    ECHO 02/10/13 EF = 60-65%, moderate posterior mitral valve prolapse w/possible flail segment, severe MR, mitral regurgitant jet is anteriorly directed.   History of blood transfusion    pre-eclampsia    Hyperpotassemia    Migrated   Medicare annual wellness visit, subsequent    Mitral valve prolapse    MR (mitral regurgitation)    ECHO 02/10/13 EF = 60-65%, moderate posterior mitral valve prolapse w/possible flail segment, severe MR, mitral regurgitant jet is anteriorly directed.   Risk for falls    Routine general medical examination at a health care facility    S/P minimally invasive mitral valve repair 02/01/2016   Complex valvuloplasty including triangular resection of flail posterior leaflet, artificial Gore-tex neochord placement x10 and 28 mm Sorin Memo 3D ring annuloplasty via right  mini thoracotomy approach   Seizures (HCC)    with pre- echa   Shortness of breath dyspnea    with exertion    Past Surgical History:  Procedure Laterality Date   BREAST BIOPSY Right 1998   benign cyst   BREAST LUMPECTOMY WITH RADIOACTIVE SEED LOCALIZATION Left 10/27/2020   Procedure: LEFT BREAST LUMPECTOMY WITH RADIOACTIVE SEED LOCALIZATION;  Surgeon: Abigail Miyamoto, MD;  Location: [facility];  Service: General;  Laterality: Left;   CARDIAC CATHETERIZATION N/A 12/21/2015   Procedure: Right/Left Heart Cath and Coronary Angiography;  Surgeon: Tonny Bollman, MD;  Location: [facility] INVASIVE CV LAB;  Service: Cardiovascular;  Laterality: N/A;   COLONOSCOPY  08/2001   Colon cancer screening in 01/2012   LAPAROSCOPIC CHOLECYSTECTOMY  1990   left knee arthroscopic surgery Left 05/29/2018   MITRAL VALVE REPAIR Right 02/01/2016   Procedure: MINIMALLY INVASIVE MITRAL VALVE REPAIR (MVR) with closure of PFO;  Surgeon: Purcell Nails, MD;  Location: [facility] OR;  Service: Open Heart Surgery;  Laterality: Right;   TEE WITHOUT CARDIOVERSION N/A 10/14/2015   Procedure: TRANSESOPHAGEAL ECHOCARDIOGRAM (TEE);  Surgeon: Vesta Mixer, MD;  Location: [facility] ENDOSCOPY;  Service: Cardiovascular;  Laterality: N/A;   TEE WITHOUT CARDIOVERSION N/A 02/01/2016   Procedure: TRANSESOPHAGEAL ECHOCARDIOGRAM (TEE);  Surgeon: Purcell Nails, MD;  Location: [facility] OR;  Service: Open Heart Surgery;  Laterality: N/A;   TOTAL ABDOMINAL HYSTERECTOMY  1983   Cervical cancer 1983    Current Medications: Prior to Admission medications   Medication Sig Start Date End Date Taking? Authorizing Provider  acetaminophen (TYLENOL) 325 MG tablet Take 650 mg by mouth every 6 (six) hours as needed.    Historical Provider, MD  aspirin 81 MG tablet Take 81 mg by mouth daily.    Historical Provider, MD  calcium citrate-vitamin D (CITRACAL+D) 315-200 MG-UNIT tablet Take 1 tablet by mouth 3 (three) times daily. VITAMIN D DOSAGE IS 250MG   INSTEAD OF 200    Historical Provider, MD  diclofenac sodium (VOLTAREN) 1 % GEL Apply 2 g topically as needed (back pain). 02/07/16   Donielle Margaretann Loveless, PA-C  metoprolol tartrate (LOPRESSOR) 25 MG tablet Take 0.5 tablets (12.5 mg total) by mouth 2 (two) times daily. 02/21/16   Purcell Nails, MD  traMADol (ULTRAM) 50 MG tablet Take 1 tablet (50 mg total) by mouth every 6 (six) hours as needed for moderate pain. 02/07/16   Donielle Margaretann Loveless, PA-C  warfarin (COUMADIN) 2.5 MG tablet Take 1 to 1.5 tablets by mouth daily as directed by coumadin clinic 03/09/16   Vesta Mixer, MD    Allergies:   Penicillins, Codone [hydrocodone], Oxycodone-acetaminophen, Codeine, Doxycycline hyclate, and Ultram [tramadol]   Social History   Socioeconomic History   Marital status: Married    Spouse name: Not on file   Number of children: 1   Years of education: Not on file   Highest education level: Not on file  Occupational History   Not on file  Tobacco Use   Smoking status: Former    Current packs/day: 0.00    Types: Cigarettes    Start date: 06/18/1960    Quit date: 06/18/1985    Years since quitting: 37.7   Smokeless tobacco: Never  Substance and Sexual Activity   Alcohol use: Not Currently   Drug use: No   Sexual activity: Never  Other Topics Concern   Not on file  Social History Narrative   Not on file   Social Determinants of Health   Financial Resource Strain: Low Risk  (04/13/2021)   Overall Financial Resource Strain (CARDIA)    Difficulty of Paying Living Expenses: Not hard at all  Food Insecurity: No Food Insecurity (04/13/2021)   Hunger Vital Sign    Worried About Running Out of Food in the Last Year: Never true    Ran Out of Food in the Last Year: Never true  Transportation Needs: No Transportation Needs (04/13/2021)   PRAPARE - Administrator, Civil Service (Medical): No    Lack of Transportation (Non-Medical): No  Physical Activity: Sufficiently Active  (04/13/2021)   Exercise Vital Sign  Days of Exercise per Week: 7 days    Minutes of Exercise per Session: 60 min  Stress: No Stress Concern Present (04/13/2021)   [employer] of Occupational Health - Occupational Stress Questionnaire    Feeling of Stress : Not at all  Social Connections: Moderately Integrated (04/13/2021)   Social Connection and Isolation Panel [NHANES]    Frequency of Communication with Friends and Family: More than three times a week    Frequency of Social Gatherings with Friends and Family: More than three times a week    Attends Religious Services: Never    Database administrator or Organizations: Yes    Attends Engineer, structural: 1 to 4 times per year    Marital Status: Married     Family History:  The patient's family history includes AAA (abdominal aortic aneurysm) in her father; Breast cancer (age of onset: 37) in her mother; CAD in her father and mother; Heart disease in her father and mother; High Cholesterol in her brother; Hypertension in her brother, father, and mother; Skin cancer in her brother.   ROS:   Please see the history of present illness.    ROS All other systems reviewed and are negative.   PHYSICAL EXAM:     Physical Exam: Blood pressure 102/62, pulse 66, height 5' 4.5" (1.638 m), weight 130 lb 12.8 oz (59.3 kg), SpO2 98%.      GEN:  Well nourished, well developed in no acute distress HEENT: Normal NECK: No JVD; No carotid bruits LYMPHATICS: No lymphadenopathy CARDIAC: RRR , very minimal murmur - valve sounds great  RESPIRATORY:  Clear to auscultation without rales, wheezing or rhonchi  ABDOMEN: Soft, non-tender, non-distended MUSCULOSKELETAL:  No edema; No deformity  SKIN: Warm and dry NEUROLOGIC:  Alert and oriented x 3   ECG:    EKG Interpretation Date/Time:  Tuesday March 26 2023 10:20:34 EDT Ventricular Rate:  66 PR Interval:  192 QRS Duration:  74 QT Interval:  388 QTC Calculation: 406 R  Axis:   21  Text Interpretation: Normal sinus rhythm Low voltage QRS Septal infarct (cited on or before 21-Dec-2015) When compared with ECG of 02-Feb-2016 07:22, Sinus rhythm has replaced Atrial fibrillation Vent. rate has increased BY  30 BPM ST no longer elevated in Inferior leads ST no longer depressed in Anterior leads T wave inversion no longer evident in Inferior leads Nonspecific T wave abnormality no longer evident in Anterolateral leads Confirmed by Kristeen Miss ([id number]) on 03/26/2023 11:02:43 AM      Wt Readings from Last 3 Encounters:  03/26/23 130 lb 12.8 oz (59.3 kg)  12/04/22 129 lb 9.6 oz (58.8 kg)  03/26/22 131 lb (59.4 kg)      Studies/Labs Reviewed:      Recent Labs: No results found for requested labs within last 365 days.   Lipid Panel No results found for: "CHOL", "TRIG", "HDL", "CHOLHDL", "VLDL", "LDLCALC", "LDLDIRECT"  Additional studies/ records that were reviewed today include:   As above  ASSESSMENT & PLAN:     . Mitral valve prolapse with severe MR: s/P MV repair .   Destiny Andersen is doing very well.  She is not having any issues.  Her valve sounds great. She continues to use SBE prophylaxis prior to dental work.  She has been on clindamycin.  We discussed the fact that clindamycin is longer no longer recommended for SBE prophylaxis.  I suggested a azithromycin but she is hesitant to change since it is working for her.  She has not had any episodes of diarrhea.  If she does have any issues in the future we should consider azithromycin for SBE prophylaxis.  Follow up in 1 year with Dr. Cristal Deer         Signed, Kristeen Miss, MD  03/26/2023 11:02 AM    [facility] [address] [place], [place], [place]  [address] Phone: [phone]; Fax: [phone]

## 2023-03-26 ENCOUNTER — Ambulatory Visit: Payer: Medicare PPO | Attending: Cardiovascular Disease | Admitting: Cardiovascular Disease

## 2023-03-26 ENCOUNTER — Encounter: Payer: Self-pay | Admitting: Cardiovascular Disease

## 2023-03-26 VITALS — BP 102/62 | HR 66 | Ht 64.5 in | Wt 130.8 lb

## 2023-03-26 DIAGNOSIS — Z09 Encounter for follow-up examination after completed treatment for conditions other than malignant neoplasm: Secondary | ICD-10-CM | POA: Diagnosis not present

## 2023-03-26 DIAGNOSIS — I341 Nonrheumatic mitral (valve) prolapse: Secondary | ICD-10-CM

## 2023-03-26 NOTE — Patient Instructions (Signed)
Medication Instructions:  Your physician recommends that you continue on your current medications as directed. Please refer to the Current Medication list given to you today.  *If you need a refill on your cardiac medications before your next appointment, please call your pharmacy*   Lab Work: NONE If you have labs (blood work) drawn today and your tests are completely normal, you will receive your results only by: MyChart Message (if you have MyChart) OR A paper copy in the mail If you have any lab test that is abnormal or we need to change your treatment, we will call you to review the results.   Testing/Procedures: NONE   Follow-Up: At [facility], you and your health needs are our priority.  As part of our continuing mission to provide you with exceptional heart care, we have created designated Provider Care Teams.  These Care Teams include your primary Cardiologist (physician) and Advanced Practice Providers (APPs -  Physician Assistants and Nurse Practitioners) who all work together to provide you with the care you need, when you need it.  We recommend signing up for the patient portal called "MyChart".  Sign up information is provided on this After Visit Summary.  MyChart is used to connect with patients for Virtual Visits (Telemedicine).  Patients are able to view lab/test results, encounter notes, upcoming appointments, etc.  Non-urgent messages can be sent to your provider as well.   To learn more about what you can do with MyChart, go to [email or web address].    Your next appointment:   1 year(s)  Provider:   Jodelle Red, MD

## 2023-05-01 DIAGNOSIS — Z9189 Other specified personal risk factors, not elsewhere classified: Secondary | ICD-10-CM | POA: Diagnosis not present

## 2023-05-01 DIAGNOSIS — Z8541 Personal history of malignant neoplasm of cervix uteri: Secondary | ICD-10-CM | POA: Diagnosis not present

## 2023-05-01 DIAGNOSIS — N9089 Other specified noninflammatory disorders of vulva and perineum: Secondary | ICD-10-CM | POA: Diagnosis not present

## 2023-05-01 DIAGNOSIS — Z9289 Personal history of other medical treatment: Secondary | ICD-10-CM | POA: Diagnosis not present

## 2023-05-01 DIAGNOSIS — M85852 Other specified disorders of bone density and structure, left thigh: Secondary | ICD-10-CM | POA: Diagnosis not present

## 2023-05-02 ENCOUNTER — Other Ambulatory Visit: Payer: Self-pay | Admitting: Nurse Practitioner

## 2023-05-02 DIAGNOSIS — M85852 Other specified disorders of bone density and structure, left thigh: Secondary | ICD-10-CM

## 2023-05-03 ENCOUNTER — Other Ambulatory Visit: Payer: Self-pay | Admitting: Hematology and Oncology

## 2023-05-03 DIAGNOSIS — Z9889 Other specified postprocedural states: Secondary | ICD-10-CM

## 2023-05-31 DIAGNOSIS — N1831 Chronic kidney disease, stage 3a: Secondary | ICD-10-CM | POA: Diagnosis not present

## 2023-05-31 DIAGNOSIS — E7800 Pure hypercholesterolemia, unspecified: Secondary | ICD-10-CM | POA: Diagnosis not present

## 2023-05-31 DIAGNOSIS — D72819 Decreased white blood cell count, unspecified: Secondary | ICD-10-CM | POA: Diagnosis not present

## 2023-05-31 DIAGNOSIS — Z79899 Other long term (current) drug therapy: Secondary | ICD-10-CM | POA: Diagnosis not present

## 2023-05-31 DIAGNOSIS — N9089 Other specified noninflammatory disorders of vulva and perineum: Secondary | ICD-10-CM | POA: Diagnosis not present

## 2023-06-06 DIAGNOSIS — D72819 Decreased white blood cell count, unspecified: Secondary | ICD-10-CM | POA: Diagnosis not present

## 2023-06-06 DIAGNOSIS — Z853 Personal history of malignant neoplasm of breast: Secondary | ICD-10-CM | POA: Diagnosis not present

## 2023-06-06 DIAGNOSIS — Z8541 Personal history of malignant neoplasm of cervix uteri: Secondary | ICD-10-CM | POA: Diagnosis not present

## 2023-06-06 DIAGNOSIS — E7800 Pure hypercholesterolemia, unspecified: Secondary | ICD-10-CM | POA: Diagnosis not present

## 2023-06-06 DIAGNOSIS — N1831 Chronic kidney disease, stage 3a: Secondary | ICD-10-CM | POA: Diagnosis not present

## 2023-06-06 DIAGNOSIS — Z79899 Other long term (current) drug therapy: Secondary | ICD-10-CM | POA: Diagnosis not present

## 2023-06-06 DIAGNOSIS — Z0001 Encounter for general adult medical examination with abnormal findings: Secondary | ICD-10-CM | POA: Diagnosis not present

## 2023-06-25 DIAGNOSIS — D2272 Melanocytic nevi of left lower limb, including hip: Secondary | ICD-10-CM | POA: Diagnosis not present

## 2023-06-25 DIAGNOSIS — D2262 Melanocytic nevi of left upper limb, including shoulder: Secondary | ICD-10-CM | POA: Diagnosis not present

## 2023-06-25 DIAGNOSIS — Z85828 Personal history of other malignant neoplasm of skin: Secondary | ICD-10-CM | POA: Diagnosis not present

## 2023-06-25 DIAGNOSIS — L821 Other seborrheic keratosis: Secondary | ICD-10-CM | POA: Diagnosis not present

## 2023-06-25 DIAGNOSIS — D2261 Melanocytic nevi of right upper limb, including shoulder: Secondary | ICD-10-CM | POA: Diagnosis not present

## 2023-06-25 DIAGNOSIS — D2271 Melanocytic nevi of right lower limb, including hip: Secondary | ICD-10-CM | POA: Diagnosis not present

## 2023-06-25 DIAGNOSIS — D225 Melanocytic nevi of trunk: Secondary | ICD-10-CM | POA: Diagnosis not present

## 2023-09-09 ENCOUNTER — Ambulatory Visit
Admission: RE | Admit: 2023-09-09 | Discharge: 2023-09-09 | Disposition: A | Discharge status: Home / Self Care | Payer: Medicare PPO | Source: Ambulatory Visit | Attending: Hematology and Oncology | Admitting: Hematology and Oncology

## 2023-09-09 DIAGNOSIS — Z08 Encounter for follow-up examination after completed treatment for malignant neoplasm: Secondary | ICD-10-CM | POA: Diagnosis not present

## 2023-09-09 DIAGNOSIS — Z853 Personal history of malignant neoplasm of breast: Secondary | ICD-10-CM | POA: Diagnosis not present

## 2023-09-09 DIAGNOSIS — Z9889 Other specified postprocedural states: Secondary | ICD-10-CM

## 2023-09-27 DIAGNOSIS — D3132 Benign neoplasm of left choroid: Secondary | ICD-10-CM | POA: Diagnosis not present

## 2023-09-27 DIAGNOSIS — Z961 Presence of intraocular lens: Secondary | ICD-10-CM | POA: Diagnosis not present

## 2023-11-05 ENCOUNTER — Telehealth: Payer: Self-pay | Admitting: Hematology and Oncology

## 2023-11-05 NOTE — Telephone Encounter (Signed)
 Left vm about rescheduled appt date and time.

## 2023-12-05 ENCOUNTER — Ambulatory Visit: Payer: Medicare PPO | Admitting: Hematology and Oncology

## 2023-12-05 DIAGNOSIS — D72819 Decreased white blood cell count, unspecified: Secondary | ICD-10-CM | POA: Diagnosis not present

## 2023-12-06 ENCOUNTER — Other Ambulatory Visit: Payer: Self-pay

## 2023-12-06 ENCOUNTER — Inpatient Hospital Stay: Attending: Adult Health | Admitting: Adult Health

## 2023-12-06 ENCOUNTER — Telehealth: Payer: Self-pay

## 2023-12-06 VITALS — BP 121/67 | HR 64 | Temp 98.3°F | Resp 18 | Ht 64.5 in | Wt 128.2 lb

## 2023-12-06 DIAGNOSIS — H10411 Chronic giant papillary conjunctivitis, right eye: Secondary | ICD-10-CM | POA: Diagnosis not present

## 2023-12-06 DIAGNOSIS — C50212 Malignant neoplasm of upper-inner quadrant of left female breast: Secondary | ICD-10-CM

## 2023-12-06 DIAGNOSIS — Z923 Personal history of irradiation: Secondary | ICD-10-CM | POA: Diagnosis not present

## 2023-12-06 DIAGNOSIS — Z170 Estrogen receptor positive status [ER+]: Secondary | ICD-10-CM | POA: Diagnosis not present

## 2023-12-06 DIAGNOSIS — Z853 Personal history of malignant neoplasm of breast: Secondary | ICD-10-CM | POA: Diagnosis not present

## 2023-12-06 DIAGNOSIS — Z87891 Personal history of nicotine dependence: Secondary | ICD-10-CM | POA: Diagnosis not present

## 2023-12-06 NOTE — Assessment & Plan Note (Signed)
 09/28/2020:Screening mammogram showed a possible left breast mass. Diagnostic mammogram and US  showed a 1.3cm left breast mass at the 11 o'clock position and no left axillary adenopathy. Biopsy showed invasive ductal carcinoma, grade 2, HER-2 equivocal by IHC, negative by FISH (ratio 1.26), ER/PR+ 90%, KI67 15%.   Treatment Plan: 1. 10/27/20 Breast conserving surgery: Grade 2 IDC, ER 90%, PR 9%, Her 2 Neg, Ki 67: 15% 2. Oncotype DX testing: Score 9 (3% ROR) 3. Adjuvant radiation therapy to complete 01/12/21 4. Adjuvant antiestrogen therapy with letrozole (refused) ------------------------------------------------------------------------------------------------------------------ Assessment and Plan Assessment & Plan Stage 1A ERPR positive breast cancer Diagnosed in 2022, post-lumpectomy and adjuvant radiation. Recent mammogram negative for malignancy. Discussed Guardant Reveal test for circulating tumor DNA detection. Explained test's novelty and insurance coverage possibilities. She expressed interest despite aversion to blood draws; home phlebotomy considered. Positive results will lead to imaging. - Order Guardant Reveal lab test. - Schedule follow-up for lab test. - Coordinate with PCP for recent hemoglobin results and address concerns.  Continue annual f/u for surveillance

## 2023-12-06 NOTE — Telephone Encounter (Signed)
 Order was placed and pt will return for in clinic guardant reveal in 2 weeks.

## 2023-12-06 NOTE — Progress Notes (Signed)
 [facility] Follow up:    Destiny Pinal, MD [address] [place] [address]   DIAGNOSIS:  Cancer Staging  Malignant neoplasm of upper-inner quadrant of left breast in female, estrogen receptor positive (HCC) Staging form: Breast, AJCC 8th Edition - Clinical stage from 09/20/2020: Stage IA (cT1c, cN0, cM0, G2, ER+, PR+, HER2-) - Signed by [name], NP on 09/28/2020 Stage prefix: Initial diagnosis Histologic grading system: 3 grade system - Pathologic stage from 11/29/2020: Stage IA (pT1c, pN0, cM0, G2, ER+, PR+, HER2-, Oncotype DX score: 9) - Unsigned Stage prefix: Initial diagnosis Multigene prognostic tests performed: Oncotype DX Recurrence score range: Less than 11 Histologic grading system: 3 grade system    SUMMARY OF ONCOLOGIC HISTORY: Oncology History  Malignant neoplasm of upper-inner quadrant of left breast in female, estrogen receptor positive (HCC)  09/20/2020 Cancer Staging   Staging form: Breast, AJCC 8th Edition - Clinical stage from 09/20/2020: Stage IA (cT1c, cN0, cM0, G2, ER+, PR+, HER2-) - Signed by [name], NP on 09/28/2020 Stage prefix: Initial diagnosis Histologic grading system: 3 grade system   09/28/2020 Initial Diagnosis   Screening mammogram showed a possible left breast mass. Diagnostic mammogram and US  showed a 1.3cm left breast mass at the 11 o'clock position and no left axillary adenopathy. Biopsy showed invasive ductal carcinoma, grade 2, HER-2 equivocal by IHC, negative by FISH (ratio 1.26), ER/PR+ 90%, KI67 15%.    10/27/2020 Surgery   Left lumpectomy Destiny [facility]): IDC, 1.5cm, clear margins   10/27/2020 Oncotype testing   Score: 9 (ROR 3%)   12/15/2020 - 01/12/2021 Radiation Therapy   Adj XRT     CURRENT THERAPY: observation  INTERVAL HISTORY: Discussed the use of AI scribe software for clinical note transcription with the patient, who gave verbal consent to proceed.  History  of Present Illness Destiny Andersen is a 78 year old female with stage 1A ERPR positive breast cancer who presents for follow-up.  Diagnosed with stage 1A ERPR positive breast cancer in 2022, she underwent a lumpectomy followed by adjuvant radiation therapy. Her most recent mammogram on September 09, 2023, showed no mammographic evidence of malignancy and breast density category B. No new lumps or concerns have been noted.  Her recent visit to her general practitioner revealed low hemoglobin levels. Follow-up blood work was conducted yesterday, but results are pending. She is not currently on the patient portal to access her lab results.    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  MEDICAL HISTORY: Past Medical History:  Diagnosis Date   Breast cancer (HCC) 09/20/2020   Cancer (HCC) 1983   Cervical cancer.  Skin Cancer- leg   Chronic headaches    ? Migraines- does not have then any more   Constipation    Elevated C-reactive protein (CRP)    Family history of breast cancer    Mother   Fatigue    Grover's disease  2022   Grover's disease    Heart murmur    ECHO 02/10/13 EF = 60-65%, moderate posterior mitral valve prolapse w/possible flail segment, severe MR, mitral regurgitant jet is anteriorly directed.   History of blood transfusion    pre-eclampsia    Hyperpotassemia    Migrated   Medicare  annual wellness visit, subsequent    Mitral valve prolapse    MR (mitral regurgitation)    ECHO 02/10/13 EF = 60-65%, moderate posterior mitral valve prolapse w/possible flail segment, severe MR, mitral regurgitant jet is anteriorly directed.   Risk for falls    Routine general medical examination at a health care facility    S/P minimally invasive mitral valve repair 02/01/2016   Complex valvuloplasty including triangular resection of flail posterior leaflet, artificial Gore-tex neochord placement x10 and 28 mm Sorin Memo 3D ring annuloplasty via right mini thoracotomy approach   Seizures (HCC)    with pre- echa   Shortness of breath dyspnea    with exertion    SURGICAL HISTORY: Past Surgical History:  Procedure Laterality Date   BREAST BIOPSY Right 1998   benign cyst   BREAST LUMPECTOMY WITH RADIOACTIVE SEED LOCALIZATION Left 10/27/2020   Procedure: LEFT BREAST LUMPECTOMY WITH RADIOACTIVE SEED LOCALIZATION;  Surgeon: Oza Blumenthal, MD;  Location: [facility];  Service: General;  Laterality: Left;   CARDIAC CATHETERIZATION N/A 12/21/2015   Procedure: Right/Left Heart Cath and Coronary Angiography;  Surgeon: Arnoldo Lapping, MD;  Location: [facility] INVASIVE CV LAB;  Service: Cardiovascular;  Laterality: N/A;   COLONOSCOPY  08/2001   Colon cancer screening in 01/2012   LAPAROSCOPIC CHOLECYSTECTOMY  1990   left knee arthroscopic surgery Left 05/29/2018   MITRAL VALVE REPAIR Right 02/01/2016   Procedure: MINIMALLY INVASIVE MITRAL VALVE REPAIR (MVR) with closure of PFO;  Surgeon: Gardenia Jump, MD;  Location: [facility] OR;  Service: Open Heart Surgery;  Laterality: Right;   TEE WITHOUT CARDIOVERSION N/A 10/14/2015   Procedure: TRANSESOPHAGEAL ECHOCARDIOGRAM (TEE);  Surgeon: Lake Pilgrim, MD;  Location: [facility];  Service: Cardiovascular;  Laterality: N/A;   TEE WITHOUT CARDIOVERSION N/A 02/01/2016   Procedure: TRANSESOPHAGEAL ECHOCARDIOGRAM (TEE);  Surgeon: Gardenia Jump, MD;   Location: [facility] OR;  Service: Open Heart Surgery;  Laterality: N/A;   TOTAL ABDOMINAL HYSTERECTOMY  1983   Cervical cancer 1983    SOCIAL HISTORY: Social History   Socioeconomic History   Marital status: Married    Spouse name: Not on file   Number of children: 1   Years of education: Not on file   Highest education level: Not on file  Occupational History   Not on file  Tobacco Use   Smoking status: Former    Current packs/day: 0.00    Types: Cigarettes    Start date: 06/18/1960    Quit date: 06/18/1985    Years since quitting: 38.4   Smokeless tobacco: Never  Substance and Sexual Activity   Alcohol use: Not Currently   Drug use: No   Sexual activity: Never  Other Topics Concern   Not on file  Social History Narrative   Not on file   Social Drivers of Health   Financial Resource Strain: Low Risk  (04/13/2021)   Overall Financial Resource Strain (CARDIA)    Difficulty of Paying Living Expenses: Not hard at all  Food Insecurity: No Food Insecurity (04/13/2021)   Hunger Vital Sign    Worried About Running Out of Food in the Last Year: Never true  Ran Out of Food in the Last Year: Never true  Transportation Needs: No Transportation Needs (04/13/2021)   PRAPARE - Administrator, Civil Service (Medical): No    Lack of Transportation (Non-Medical): No  Physical Activity: Sufficiently Active (04/13/2021)   Exercise Vital Sign    Days of Exercise per Week: 7 days    Minutes of Exercise per Session: 60 min  Stress: No Stress Concern Present (04/13/2021)   [employer] of Occupational Health - Occupational Stress Questionnaire    Feeling of Stress : Not at all  Social Connections: Moderately Integrated (04/13/2021)   Social Connection and Isolation Panel    Frequency of Communication with Friends and Family: More than three times a week    Frequency of Social Gatherings with Friends and Family: More than three times a week    Attends Religious Services: Never     Database administrator or Organizations: Yes    Attends Banker Meetings: 1 to 4 times per year    Marital Status: Married  Catering manager Violence: Not At Risk (04/13/2021)   Humiliation, Afraid, Rape, and Kick questionnaire    Fear of Current or Ex-Partner: No    Emotionally Abused: No    Physically Abused: No    Sexually Abused: No    FAMILY HISTORY: Family History  Problem Relation Age of Onset   Hypertension Mother    Heart disease Mother    CAD Mother    Breast cancer Mother 46   AAA (abdominal aortic aneurysm) Father    CAD Father    Hypertension Father    Heart disease Father    High Cholesterol Brother    Hypertension Brother    Skin cancer Brother     Review of Systems  Constitutional:  Negative for appetite change, chills, fatigue, fever and unexpected weight change.  HENT:   Negative for hearing loss, lump/mass and trouble swallowing.   Eyes:  Negative for eye problems and icterus.  Respiratory:  Negative for chest tightness, cough and shortness of breath.   Cardiovascular:  Negative for chest pain, leg swelling and palpitations.  Gastrointestinal:  Negative for abdominal distention, abdominal pain, constipation, diarrhea, nausea and vomiting.  Endocrine: Negative for hot flashes.  Genitourinary:  Negative for difficulty urinating.   Musculoskeletal:  Negative for arthralgias.  Skin:  Negative for itching and rash.  Neurological:  Negative for dizziness, extremity weakness, headaches and numbness.  Hematological:  Negative for adenopathy. Does not bruise/bleed easily.  Psychiatric/Behavioral:  Negative for depression. The patient is not nervous/anxious.       PHYSICAL EXAMINATION    Vitals:   12/06/23 1013  BP: 121/67  Pulse: 64  Resp: 18  Temp: 98.3 F (36.8 C)  SpO2: 98%    Physical Exam Constitutional:      General: She is not in acute distress.    Appearance: Normal appearance. She is not toxic-appearing.  HENT:      Head: Normocephalic and atraumatic.   Eyes:     General: No scleral icterus.    Comments: Right eye scleral injection, no drainage noted, pupils are normal  Chest:     Comments: Left breast s/p lumpectomy and radiation, no sign of local recurrence, right breast benign Abdominal:     General: Bowel sounds are normal.   Musculoskeletal:        General: No swelling.     Cervical back: Neck supple.  Lymphadenopathy:     Upper Body:  Right upper body: No supraclavicular or axillary adenopathy.     Left upper body: No supraclavicular or axillary adenopathy.   Skin:    General: Skin is warm and dry.     Findings: No rash.   Neurological:     General: No focal deficit present.     Mental Status: She is alert.   Psychiatric:        Mood and Affect: Mood normal.        Behavior: Behavior normal.     LABORATORY DATA:  CBC    Component Value Date/Time   WBC 6.2 01/29/2020 1422   WBC 8.8 02/04/2016 0312   RBC 3.53 (L) 01/29/2020 1422   RBC 3.12 (L) 02/04/2016 0312   HGB 11.9 01/29/2020 1422   HCT 33.7 (L) 01/29/2020 1422   PLT 273 01/29/2020 1422   MCV 96 01/29/2020 1422   MCH 33.7 (H) 01/29/2020 1422   MCH 30.4 02/04/2016 0312   MCHC 35.3 01/29/2020 1422   MCHC 31.9 02/04/2016 0312   RDW 11.7 01/29/2020 1422   LYMPHSABS 1.4 01/29/2020 1422   MONOABS 520 10/11/2015 1254   EOSABS 0.6 (H) 01/29/2020 1422   BASOSABS 0.0 01/29/2020 1422    CMP     Component Value Date/Time   NA 138 03/11/2020 1105   K 4.4 03/11/2020 1105   CL 102 03/11/2020 1105   CO2 26 03/11/2020 1105   GLUCOSE 77 03/11/2020 1105   GLUCOSE 102 (H) 02/05/2016 0308   BUN 17 03/11/2020 1105   CREATININE 1.05 (H) 03/11/2020 1105   CREATININE 0.93 12/15/2015 1429   CALCIUM 9.9 03/11/2020 1105   PROT 7.9 01/30/2016 1311   ALBUMIN  4.3 01/30/2016 1311   AST 26 01/30/2016 1311   ALT 17 01/30/2016 1311   ALKPHOS 42 01/30/2016 1311   BILITOT 0.4 01/30/2016 1311   GFRNONAA 52 (L) 03/11/2020 1105    GFRAA 60 03/11/2020 1105     ASSESSMENT and THERAPY PLAN:   Malignant neoplasm of upper-inner quadrant of left breast in female, estrogen receptor positive (HCC) 09/28/2020:Screening mammogram showed a possible left breast mass. Diagnostic mammogram and US  showed a 1.3cm left breast mass at the 11 o'clock position and no left axillary adenopathy. Biopsy showed invasive ductal carcinoma, grade 2, HER-2 equivocal by IHC, negative by FISH (ratio 1.26), ER/PR+ 90%, KI67 15%.   Treatment Plan: 1. 10/27/20 Breast conserving surgery: Grade 2 IDC, ER 90%, PR 9%, Her 2 Neg, Ki 67: 15% 2. Oncotype DX testing: Score 9 (3% ROR) 3. Adjuvant radiation therapy to complete 01/12/21 4. Adjuvant antiestrogen therapy with letrozole (refused) ------------------------------------------------------------------------------------------------------------------ Assessment and Plan Assessment & Plan Stage 1A ERPR positive breast cancer Diagnosed in 2022, post-lumpectomy and adjuvant radiation. Recent mammogram negative for malignancy. Discussed [employer] Reveal test for circulating tumor DNA detection. Explained test's novelty and insurance coverage possibilities. She expressed interest despite aversion to blood draws; home phlebotomy considered. Positive results will lead to imaging. - Order [facility] Reveal lab test. - Schedule follow-up for lab test. - Coordinate with PCP for recent hemoglobin results and address concerns.  Continue annual f/u for surveillance     All questions were answered. The patient knows to call the clinic with any problems, questions or concerns. We can certainly see the patient much sooner if necessary.  Total encounter time:30 minutes*in face-to-face visit time, chart review, lab review, care coordination, order entry, and documentation of the encounter time.    Alwin Baars, NP 12/06/23 10:55 AM Medical Oncology and Hematology [facility]  [facility] [address]  [place], [place] [address] Tel. [phone]    Fax. [phone]  *Total Encounter Time as defined by the Centers for Medicare and Medicaid Services includes, in addition to the face-to-face time of a patient visit (documented in the note above) non-face-to-face time: obtaining and reviewing outside history, ordering and reviewing medications, tests or procedures, care coordination (communications with other health care professionals or caregivers) and documentation in the medical record.

## 2023-12-11 ENCOUNTER — Telehealth: Payer: Self-pay | Admitting: *Deleted

## 2023-12-11 NOTE — Telephone Encounter (Signed)
 Notified by scheduling that patient cancelled her lab appointment on 12/13/23  to get a [facility]ant Reveal lab test ordered by Morna Kendall, NP  because she stated her insurance needed prior auth. Scheduler stated patient requested a call back from a nurse with update on the prior auth going through.   Contacted patient: LVM with information that per staff in this office, [employer] will file with patient's insurance. And if insurance does not authorize or pays only small amount, [facility] covers cost.  If she would like to reschedule a lab appointment for the test, please let office know. Encouraged patient to contact CC for any questions.

## 2023-12-13 ENCOUNTER — Inpatient Hospital Stay

## 2023-12-16 ENCOUNTER — Other Ambulatory Visit

## 2023-12-16 DIAGNOSIS — N1831 Chronic kidney disease, stage 3a: Secondary | ICD-10-CM | POA: Diagnosis not present

## 2023-12-16 DIAGNOSIS — M79601 Pain in right arm: Secondary | ICD-10-CM | POA: Diagnosis not present

## 2023-12-19 ENCOUNTER — Other Ambulatory Visit: Payer: Medicare PPO

## 2023-12-19 ENCOUNTER — Other Ambulatory Visit (HOSPITAL_BASED_OUTPATIENT_CLINIC_OR_DEPARTMENT_OTHER)

## 2024-01-24 ENCOUNTER — Ambulatory Visit (HOSPITAL_BASED_OUTPATIENT_CLINIC_OR_DEPARTMENT_OTHER)
Admission: RE | Admit: 2024-01-24 | Discharge: 2024-01-24 | Disposition: A | Discharge status: Home / Self Care | Source: Ambulatory Visit | Attending: Nurse Practitioner | Admitting: Nurse Practitioner

## 2024-01-24 DIAGNOSIS — M85852 Other specified disorders of bone density and structure, left thigh: Secondary | ICD-10-CM | POA: Diagnosis not present

## 2024-01-24 DIAGNOSIS — Z780 Asymptomatic menopausal state: Secondary | ICD-10-CM | POA: Diagnosis not present

## 2024-03-25 NOTE — Progress Notes (Signed)
 Cardiology Office Note:  .   Date:  03/26/2024  ID:  Destiny [facility], DOB 1946/01/29, MRN [id number] PCP: Regino Slater, MD   HeartCare Providers Cardiologist:  Shelda Bruckner, MD {  History of Present Illness: .   Destiny Andersen is a 78 y.o. female with PMH severe MR 2/2 mitral valve prolapse s/p minimially invasive mitral valve repair and PFO closure in 2017, history of breast cancer. She was previously followed by Dr. Alveta and established care with me on 03/26/24.  Today: Doing very well. Has felt great since her MV surgery in 2017. She walks regularly, about 30 min/day, no major limitations (mild knee pain).   Has had intermittent swelling in her legs since she was young, mild, better with elevation. Feels that she has some neuropathy since her cancer treatment.   Metoprolol  was started after surgery in 2017. She notes that initially her heart rate was slow after surgery, but then once it started recovering, it tended to go too fast. Notes I see in EMR suggests it's only been sinus tach, no arrhythmias. She does note that she falls asleep easily. We discussed trial of tapering off, she is amenable, see below.  No bleeding on aspirin , has occasional minor bruising.  ROS: Denies chest pain, shortness of breath at rest or with normal exertion. No PND, orthopnea, or unexpected weight gain. No syncope or palpitations. ROS otherwise negative except as noted.   Studies Reviewed: SABRA    EKG:  EKG Interpretation Date/Time:  Thursday March 26 2024 13:25:46 EDT Ventricular Rate:  66 PR Interval:  150 QRS Duration:  80 QT Interval:  394 QTC Calculation: 413 R Axis:   -24  Text Interpretation: Normal sinus rhythm Low voltage QRS Septal infarct (cited on or before 21-Dec-2015) When compared with ECG of 26-Mar-2023 10:20, No significant change was found Confirmed by Bruckner Shelda [phone]) on 03/26/2024 1:35:38 PM    Physical Exam:   VS:  BP 118/78   Pulse 77   Ht  5' 4 (1.626 m)   Wt 127 lb 6.4 oz (57.8 kg)   SpO2 97%   BMI 21.87 kg/m    Wt Readings from Last 3 Encounters:  03/26/24 127 lb 6.4 oz (57.8 kg)  12/06/23 128 lb 3.2 oz (58.2 kg)  03/26/23 130 lb 12.8 oz (59.3 kg)    GEN: Well nourished, well developed in no acute distress HEENT: Normal, moist mucous membranes NECK: No JVD CARDIAC: regular rhythm, normal S1 and S2, no rubs or gallops. No murmur. VASCULAR: Radial and DP pulses 2+ bilaterally. No carotid bruits RESPIRATORY:  Clear to auscultation without rales, wheezing or rhonchi  ABDOMEN: Soft, non-tender, non-distended MUSCULOSKELETAL:  Ambulates independently SKIN: Warm and dry, no edema NEUROLOGIC:  Alert and oriented x 3. No focal neuro deficits noted. PSYCHIATRIC:  Normal affect    ASSESSMENT AND PLAN: .    Severe mitral regurgitation 2/2 mitral valve prolapse s/p minimally invasive mitral valve repair and PFO closure in 2017 (Dr. Dusty) -has antibiotic prophylaxis for dental procedures -last echo 2021 with mean gradient 3.5 mmHg and trivial MR -was started on metoprolol  back in 2017 for sinus tachycardia post op. Has not had any arrhythmias. We discussed this today; will trial tapering metoprolol , she will monitor her symptoms and contact me with any issues -discussed aspirin . No complete valve replacement, does have PFO closure material and annuloplasty ring. She was initially placed on this a long time ago for elevated CRP but did not have significant CAD  on cath. No bleeding issues. Will continue for now but would stop for any bleeding concerns.  CV risk counseling and prevention -recommend heart healthy/Mediterranean diet, with whole grains, fruits, vegetable, fish, lean meats, nuts, and olive oil. Limit salt. -recommend moderate walking, 3-5 times/week for 30-50 minutes each session. Aim for at least 150 minutes/week. Goal should be pace of 3 miles/hours, or walking 1.5 miles in 30 minutes -recommend avoidance of tobacco  products. Avoid excess alcohol. -no obstructive CAD on cath in 2017  Dispo: 1 year or sooner as needed  Signed, Shelda Bruckner, MD   Shelda Bruckner, MD, PhD, [facility]  [facility]  [place]  Heart & Vascular at [facility] [address] [place], [place] [address] [phone]

## 2024-03-26 ENCOUNTER — Encounter (HOSPITAL_BASED_OUTPATIENT_CLINIC_OR_DEPARTMENT_OTHER): Payer: Self-pay | Admitting: Cardiology

## 2024-03-26 ENCOUNTER — Ambulatory Visit (HOSPITAL_BASED_OUTPATIENT_CLINIC_OR_DEPARTMENT_OTHER): Admitting: Cardiology

## 2024-03-26 VITALS — BP 118/78 | HR 77 | Ht 64.0 in | Wt 127.4 lb

## 2024-03-26 DIAGNOSIS — I341 Nonrheumatic mitral (valve) prolapse: Secondary | ICD-10-CM | POA: Diagnosis not present

## 2024-03-26 DIAGNOSIS — Z9889 Other specified postprocedural states: Secondary | ICD-10-CM

## 2024-03-26 DIAGNOSIS — Z7189 Other specified counseling: Secondary | ICD-10-CM | POA: Diagnosis not present

## 2024-03-26 DIAGNOSIS — Z8774 Personal history of (corrected) congenital malformations of heart and circulatory system: Secondary | ICD-10-CM

## 2024-03-26 NOTE — Patient Instructions (Addendum)
 Metoprolol  taper: start taking 1/2 pill in the morning and 1/2 pill at night for a week, then 1/2 pill only in the morning for three days, then stop.  If you feel racing heartbeats/hard heartbeats or just generally feel worse, let me know and we can restart.   No labs or testing today.  Follow up one year with Dr. Lonni, Reche Finder, NP or Rosaline Bane, NP

## 2024-05-12 DIAGNOSIS — B029 Zoster without complications: Secondary | ICD-10-CM | POA: Diagnosis not present

## 2024-06-05 ENCOUNTER — Inpatient Hospital Stay

## 2024-06-05 DIAGNOSIS — C50212 Malignant neoplasm of upper-inner quadrant of left female breast: Secondary | ICD-10-CM

## 2024-06-24 ENCOUNTER — Encounter: Payer: Self-pay | Admitting: Adult Health

## 2024-06-25 LAB — GUARDANT REVEAL

## 2024-07-02 ENCOUNTER — Other Ambulatory Visit: Payer: Self-pay | Admitting: Family Medicine

## 2024-07-02 DIAGNOSIS — Z853 Personal history of malignant neoplasm of breast: Secondary | ICD-10-CM

## 2024-09-10 ENCOUNTER — Encounter

## 2024-12-03 ENCOUNTER — Inpatient Hospital Stay

## 2024-12-03 ENCOUNTER — Inpatient Hospital Stay: Admitting: Hematology and Oncology
